# Patient Record
Sex: Female | Born: 1970 | Race: White | Hispanic: No | Smoking: Former smoker
Health system: Southern US, Community
[De-identification: ages and names within clinical notes are randomized; demographics above are authoritative.]

## PROBLEM LIST (undated history)

## (undated) DIAGNOSIS — F39 Unspecified mood [affective] disorder: Secondary | ICD-10-CM

## (undated) DIAGNOSIS — F419 Anxiety disorder, unspecified: Secondary | ICD-10-CM

## (undated) DIAGNOSIS — K649 Unspecified hemorrhoids: Secondary | ICD-10-CM

## (undated) DIAGNOSIS — E039 Hypothyroidism, unspecified: Secondary | ICD-10-CM

## (undated) DIAGNOSIS — R7303 Prediabetes: Secondary | ICD-10-CM

## (undated) DIAGNOSIS — Z720 Tobacco use: Secondary | ICD-10-CM

## (undated) DIAGNOSIS — T781XXA Other adverse food reactions, not elsewhere classified, initial encounter: Secondary | ICD-10-CM

## (undated) DIAGNOSIS — E785 Hyperlipidemia, unspecified: Secondary | ICD-10-CM

## (undated) DIAGNOSIS — Z8619 Personal history of other infectious and parasitic diseases: Secondary | ICD-10-CM

## (undated) DIAGNOSIS — K219 Gastro-esophageal reflux disease without esophagitis: Secondary | ICD-10-CM

## (undated) HISTORY — DX: Gastro-esophageal reflux disease without esophagitis: K21.9

## (undated) HISTORY — DX: Personal history of other infectious and parasitic diseases: Z86.19

## (undated) HISTORY — DX: Anxiety disorder, unspecified: F41.9

## (undated) HISTORY — PX: POLYPECTOMY: SHX149

## (undated) HISTORY — DX: Hyperlipidemia, unspecified: E78.5

## (undated) HISTORY — DX: Unspecified hemorrhoids: K64.9

## (undated) HISTORY — DX: Hypothyroidism, unspecified: E03.9

## (undated) HISTORY — PX: COLONOSCOPY: SHX174

## (undated) HISTORY — DX: Prediabetes: R73.03

## (undated) HISTORY — DX: Other adverse food reactions, not elsewhere classified, initial encounter: T78.1XXA

## (undated) HISTORY — DX: Tobacco use: Z72.0

## (undated) HISTORY — DX: Unspecified mood (affective) disorder: F39

---

## 1999-03-10 ENCOUNTER — Emergency Department (HOSPITAL_COMMUNITY): Admission: EM | Admit: 1999-03-10 | Discharge: 1999-03-11 | Payer: Self-pay | Admitting: Emergency Medicine

## 1999-03-11 ENCOUNTER — Encounter: Payer: Self-pay | Admitting: Emergency Medicine

## 1999-06-17 ENCOUNTER — Emergency Department (HOSPITAL_COMMUNITY): Admission: EM | Admit: 1999-06-17 | Discharge: 1999-06-17 | Payer: Self-pay

## 2003-05-28 ENCOUNTER — Emergency Department (HOSPITAL_COMMUNITY): Admission: AD | Admit: 2003-05-28 | Discharge: 2003-05-28 | Payer: Self-pay | Admitting: Family Medicine

## 2004-02-05 ENCOUNTER — Emergency Department (HOSPITAL_COMMUNITY): Admission: EM | Admit: 2004-02-05 | Discharge: 2004-02-06 | Payer: Self-pay | Admitting: Emergency Medicine

## 2005-10-13 ENCOUNTER — Emergency Department (HOSPITAL_COMMUNITY): Admission: EM | Admit: 2005-10-13 | Discharge: 2005-10-13 | Payer: Self-pay | Admitting: Family Medicine

## 2009-01-09 ENCOUNTER — Emergency Department (HOSPITAL_COMMUNITY): Admission: EM | Admit: 2009-01-09 | Discharge: 2009-01-09 | Payer: Self-pay | Admitting: Emergency Medicine

## 2009-01-09 ENCOUNTER — Emergency Department (HOSPITAL_COMMUNITY): Admission: EM | Admit: 2009-01-09 | Discharge: 2009-01-09 | Payer: Self-pay | Admitting: Family Medicine

## 2010-09-25 LAB — POCT URINALYSIS DIP (DEVICE)
Bilirubin Urine: NEGATIVE
Glucose, UA: NEGATIVE mg/dL
Ketones, ur: NEGATIVE mg/dL
Nitrite: NEGATIVE
Protein, ur: NEGATIVE mg/dL
Specific Gravity, Urine: 1.015 (ref 1.005–1.030)
Urobilinogen, UA: 0.2 mg/dL (ref 0.0–1.0)
pH: 6.5 (ref 5.0–8.0)

## 2010-09-25 LAB — COMPREHENSIVE METABOLIC PANEL
ALT: 15 U/L (ref 0–35)
AST: 20 U/L (ref 0–37)
Alkaline Phosphatase: 55 U/L (ref 39–117)
CO2: 23 mEq/L (ref 19–32)
Calcium: 9.4 mg/dL (ref 8.4–10.5)
Chloride: 105 mEq/L (ref 96–112)
GFR calc Af Amer: >60 mL/min (ref >60)
GFR calc non Af Amer: >60 mL/min (ref >60)
Glucose, Bld: 89 mg/dL (ref 70–99)
Potassium: 4.3 mEq/L (ref 3.5–5.1)
Sodium: 138 mEq/L (ref 135–145)

## 2010-09-25 LAB — LIPASE, BLOOD: Lipase: 26 U/L (ref 11–59)

## 2010-09-25 LAB — DIFFERENTIAL
Basophils Relative: 0 % (ref 0–1)
Eosinophils Absolute: 0.1 10*3/uL (ref 0.0–0.7)
Eosinophils Relative: 1 % (ref 0–5)
Lymphs Abs: 1.4 10*3/uL (ref 0.7–4.0)
Neutrophils Relative %: 76 % (ref 43–77)

## 2010-09-25 LAB — CBC
Hemoglobin: 15 g/dL (ref 12.0–15.0)
RBC: 4.6 MIL/uL (ref 3.87–5.11)
WBC: 8.4 10*3/uL (ref 4.0–10.5)

## 2010-09-25 LAB — POCT PREGNANCY, URINE: Preg Test, Ur: NEGATIVE

## 2012-03-08 ENCOUNTER — Encounter: Payer: Self-pay | Admitting: Pulmonary Disease

## 2012-03-11 ENCOUNTER — Ambulatory Visit (INDEPENDENT_AMBULATORY_CARE_PROVIDER_SITE_OTHER): Payer: No Typology Code available for payment source | Admitting: Pulmonary Disease

## 2012-03-11 ENCOUNTER — Encounter: Payer: Self-pay | Admitting: Pulmonary Disease

## 2012-03-11 VITALS — BP 122/76 | HR 73 | Temp 98.4°F | Ht 64.0 in | Wt 156.4 lb

## 2012-03-11 DIAGNOSIS — G4734 Idiopathic sleep related nonobstructive alveolar hypoventilation: Secondary | ICD-10-CM | POA: Insufficient documentation

## 2012-03-11 DIAGNOSIS — R0902 Hypoxemia: Secondary | ICD-10-CM

## 2012-03-11 DIAGNOSIS — J449 Chronic obstructive pulmonary disease, unspecified: Secondary | ICD-10-CM

## 2012-03-11 NOTE — Progress Notes (Signed)
  Subjective:    Patient ID: Alyssa Bartlett, female    DOB: 1970/12/06, 41 y.o.   MRN: [id number]  HPI PCP - Casimiro Needle Hilts 41 y.o ex smoker referred for evalution of nocturnal desaturation She underwent an allergy evaluation by Dr Lucie Leather for puffy, itchy eyes & nasal congestion. She reported excessive fatigue over last 3 y, some daytime somnolence. She had trouble initiating sleep ESS 4/24  Bedtime is 10-11 pm, latency 42m- 1 h, sleeps on her side x 2 pillows, 2-3 spont  Awakenings, no post void latency, oob by 6 30 am, feels refreshed, no headaches or dryness of mouth. She has gained 20 lbs in the last 2 y, denies excessive alcohol or caffeinated beverages. She smoked about 15 pyrs before quitting in 2010. 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 In hindsight, fatigue has improved after adjusting synthroid dose Home sleep study showed AHI 4/h or RDI 9/h but surprisingly ODI was 37/h & she spent 306 mins with satn < 89%. Spirometry only showed mild airway obstruction with FEV1 80%, FVC 100% & low ratio.  Past Medical History  Diagnosis Date  . Hypothyroidism     No past surgical history on file.  No Known Allergies  History   Social History  . Marital Status: Single    Spouse Name: N/A    Number of Children: N/A  . 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 Smoking status: Current Every Day Smoker -- 1.0 packs/day for 14 years    Types: Cigarettes    Start date: 07/20/2008  . Smokeless tobacco: Not on file  . Alcohol Use: Yes     wine  . Drug Use: No  . Sexually Active: Not on file   Other Topics Concern  . Not on file   Social History Narrative  . No narrative on file    Review of Systems  Constitutional: Negative for fever, appetite change and unexpected weight change.  HENT: Positive for congestion, sore throat, sneezing and postnasal drip. Negative for  ear pain, trouble swallowing and dental problem.   Respiratory: Positive for cough. Negative for shortness of breath.   Cardiovascular: Negative for chest pain, palpitations and leg swelling.  Gastrointestinal: Negative for nausea and abdominal pain.  Musculoskeletal: Negative for joint swelling.  Skin: Negative for rash.  Neurological: Negative for headaches.  Psychiatric/Behavioral: Negative for dysphoric mood. The patient is not nervous/anxious.        Objective:   Physical Exam  Gen. Pleasant, well-nourished, in no distress, normal affect ENT - no lesions, no post nasal drip Neck: No JVD, no thyromegaly, no carotid bruits Lungs: no use of accessory muscles, no dullness to percussion, clear without rales or rhonchi  Cardiovascular: Rhythm regular, heart sounds  normal, no murmurs or gallops, no peripheral edema Abdomen: soft and non-tender, no hepatosplenomegaly, BS normal. Musculoskeletal: No deformities, no cyanosis or clubbing Neuro:  alert, non focal        Assessment & Plan:

## 2012-03-11 NOTE — Patient Instructions (Addendum)
Drop in oxygen level appears out of proportion to lung function Repeat test

## 2012-03-11 NOTE — Assessment & Plan Note (Addendum)
Noct hypoxia seems out of proportion to airway obstruction or degree of sleep disordered breathing Will repeat nocturnal oximetry  On RA RDI of 9 & AHI 4 indicates mild upper airway resistance which does not need PAP therapy - encouraged 5-10 lb wt loss  The pathophysiology of obstructive sleep apnea , it's cardiovascular consequences & modes of treatment including CPAP were discused with the patient in detail & they evidenced understanding.

## 2012-03-19 ENCOUNTER — Encounter: Payer: Self-pay | Admitting: Pulmonary Disease

## 2012-04-04 ENCOUNTER — Telehealth: Payer: Self-pay | Admitting: Pulmonary Disease

## 2012-04-04 NOTE — Telephone Encounter (Signed)
ONO did not show significant desaturations Oxygen not required.

## 2012-04-05 NOTE — Telephone Encounter (Signed)
Returning call.

## 2012-04-05 NOTE — Telephone Encounter (Signed)
I spoke with patient about results and she verbalized understanding and had no questions 

## 2012-04-05 NOTE — Telephone Encounter (Signed)
[id number] x1 

## 2012-04-19 ENCOUNTER — Encounter: Payer: Self-pay | Admitting: Pulmonary Disease

## 2012-10-05 ENCOUNTER — Encounter (HOSPITAL_BASED_OUTPATIENT_CLINIC_OR_DEPARTMENT_OTHER): Payer: Self-pay | Admitting: *Deleted

## 2012-10-05 DIAGNOSIS — Z87891 Personal history of nicotine dependence: Secondary | ICD-10-CM | POA: Insufficient documentation

## 2012-10-05 DIAGNOSIS — R221 Localized swelling, mass and lump, neck: Secondary | ICD-10-CM | POA: Insufficient documentation

## 2012-10-05 DIAGNOSIS — R220 Localized swelling, mass and lump, head: Secondary | ICD-10-CM | POA: Insufficient documentation

## 2012-10-05 DIAGNOSIS — E039 Hypothyroidism, unspecified: Secondary | ICD-10-CM | POA: Insufficient documentation

## 2012-10-05 DIAGNOSIS — Z79899 Other long term (current) drug therapy: Secondary | ICD-10-CM | POA: Insufficient documentation

## 2012-10-05 NOTE — ED Notes (Addendum)
C/o right "facial swelling" that started this evening. Swelling to right side of lower face and lymph node/gland area  area under right jaw. Denies any fevers. Denies any cough. C/o tenderness to area. Denies sob.

## 2012-10-06 ENCOUNTER — Encounter (HOSPITAL_BASED_OUTPATIENT_CLINIC_OR_DEPARTMENT_OTHER): Payer: Self-pay | Admitting: Emergency Medicine

## 2012-10-06 ENCOUNTER — Emergency Department (HOSPITAL_BASED_OUTPATIENT_CLINIC_OR_DEPARTMENT_OTHER)
Admission: EM | Admit: 2012-10-06 | Discharge: 2012-10-06 | Disposition: A | Discharge status: Home / Self Care | Payer: No Typology Code available for payment source | Attending: Emergency Medicine | Admitting: Emergency Medicine

## 2012-10-06 DIAGNOSIS — R220 Localized swelling, mass and lump, head: Secondary | ICD-10-CM

## 2012-10-06 NOTE — ED Notes (Signed)
MD at bedside. 

## 2012-10-06 NOTE — ED Provider Notes (Signed)
History     CSN: [id number]  Arrival date & time 10/05/12  2327   First MD Initiated Contact with Patient 10/06/12 0041      Chief Complaint  Patient presents with  . Facial Swelling    (Consider location/radiation/quality/duration/timing/severity/associated sxs/prior treatment) HPI Comments: Pt with onset of right cheek and angle of jaw pain on right associated with sig swelling. No itching or rash.  Worse with trying to  Open jaw wide.  Able to eat, swallow, no dental pain, felts cheek become sore with trying to eat.  No fevers, chills, no recent new medications.  Took ibuprofen prior to arrival, swelling seems improved, still mildly tender and sore.  No prior history of same.  No CP, SOB, no other sig swollen lymph nodes.   The history is provided by the patient and a relative.    Past Medical History  Diagnosis Date  . Hypothyroidism     History reviewed. No pertinent past surgical history.  Family History  Problem Relation Age of Onset  . Allergic rhinitis Mother   . Allergic rhinitis Father   . Prostate cancer Father     History  Substance Use Topics  . Smoking status: Former Smoker -- 1.00 packs/day for 14 years    Types: Cigarettes    Start date: 07/20/2008  . Smokeless tobacco: Not on file  . Alcohol Use: Yes     Comment: wine    OB History   Grav Para Term Preterm Abortions TAB SAB Ect Mult Living                  Review of Systems  Constitutional: Negative for fever and chills.  HENT: Positive for facial swelling. Negative for ear pain, nosebleeds, congestion, sore throat, drooling, mouth sores, trouble swallowing, neck pain, dental problem, voice change and tinnitus.   Respiratory: Negative for cough and shortness of breath.   Gastrointestinal: Negative for nausea.  Skin: Negative for rash.  Neurological: Negative for headaches.    Allergies  Review of patient's allergies indicates no known allergies.  Home Medications   Current Outpatient  Rx  Name  Route  Sig  Dispense  Refill  . THYROID PO   Oral   Take 48.75 mg by mouth 2 (two) times daily.         . fish oil-omega-3 fatty acids 1000 MG capsule   Oral   Take 1 g by mouth daily.         . Multiple Vitamin (MULTIVITAMIN) tablet   Oral   Take 1 tablet by mouth daily.         Marland Kitchen thyroid (ARMOUR) 32.5 MG tablet   Oral   Take 48.75 mg by mouth 2 (two) times daily.            BP 128/77  Pulse 89  Temp(Src) 98.2 F (36.8 C) (Oral)  Resp 18  Ht 5\' 4"  (1.626 m)  Wt 150 lb (68.04 kg)  BMI 25.73 kg/m2  SpO2 96%  Physical Exam  Nursing note and vitals reviewed. Constitutional: She appears well-developed and well-nourished. No distress.  HENT:  Head: Normocephalic and atraumatic.  Mouth/Throat: No dental abscesses or dental caries. No oropharyngeal exudate.  No dental tenderness, fluctuance, no facial rash, tender to right TMJ  Eyes: EOM are normal.  Neck: Neck supple.  Pulmonary/Chest: Effort normal. No respiratory distress.  Abdominal: Soft.  Lymphadenopathy:    She has no cervical adenopathy.  Neurological: She is alert.    ED  Course  Procedures (including critical care time)  Labs Reviewed - No data to display No results found.   1. Right facial swelling     ra sat is 96% and I interpret to be normal  MDM  Pt with swelling and mild tenderness to angle of right jaw and cheek, posterior mandibular region.  Suspect TMJ versus salivary duct stone blockage.  Offered CT Scan.  Doubt abscess given no dental pain or tenderness, no fever.  Pt has no airway compromise.  Pt and family are ok with watchful waiting, taking ibuprofen and sour candies and can follow up with PCP as needed.  No new meds or rash to suggest allergy.        Gavin Pound. Oletta Lamas, MD 10/06/12 [phone]

## 2012-10-06 NOTE — Discharge Instructions (Signed)
 Watch for severe dental pain, fevers, or continued recurrence of swelling.  Rash, difficulty breathing or swallowing are also causes of concern that you should be re-evaluated for.

## 2014-09-10 ENCOUNTER — Emergency Department (HOSPITAL_COMMUNITY)
Admission: EM | Admit: 2014-09-10 | Discharge: 2014-09-10 | Disposition: A | Discharge status: Home / Self Care | Payer: No Typology Code available for payment source | Attending: Emergency Medicine | Admitting: Emergency Medicine

## 2014-09-10 ENCOUNTER — Encounter (HOSPITAL_COMMUNITY): Payer: Self-pay | Admitting: *Deleted

## 2014-09-10 DIAGNOSIS — Y998 Other external cause status: Secondary | ICD-10-CM | POA: Insufficient documentation

## 2014-09-10 DIAGNOSIS — Y9289 Other specified places as the place of occurrence of the external cause: Secondary | ICD-10-CM | POA: Insufficient documentation

## 2014-09-10 DIAGNOSIS — Z87891 Personal history of nicotine dependence: Secondary | ICD-10-CM | POA: Diagnosis not present

## 2014-09-10 DIAGNOSIS — F419 Anxiety disorder, unspecified: Secondary | ICD-10-CM | POA: Insufficient documentation

## 2014-09-10 DIAGNOSIS — T7840XA Allergy, unspecified, initial encounter: Secondary | ICD-10-CM

## 2014-09-10 DIAGNOSIS — Y9389 Activity, other specified: Secondary | ICD-10-CM | POA: Insufficient documentation

## 2014-09-10 DIAGNOSIS — X58XXXA Exposure to other specified factors, initial encounter: Secondary | ICD-10-CM | POA: Diagnosis not present

## 2014-09-10 DIAGNOSIS — T781XXA Other adverse food reactions, not elsewhere classified, initial encounter: Secondary | ICD-10-CM | POA: Insufficient documentation

## 2014-09-10 DIAGNOSIS — Z8639 Personal history of other endocrine, nutritional and metabolic disease: Secondary | ICD-10-CM | POA: Insufficient documentation

## 2014-09-10 DIAGNOSIS — R232 Flushing: Secondary | ICD-10-CM | POA: Diagnosis present

## 2014-09-10 LAB — I-STAT CHEM 8, ED
BUN: 9 mg/dL (ref 6–23)
CALCIUM ION: 1.1 mmol/L — AB (ref 1.12–1.23)
CREATININE: 0.6 mg/dL (ref 0.50–1.10)
Chloride: 104 mmol/L (ref 96–112)
GLUCOSE: 146 mg/dL — AB (ref 70–99)
HEMATOCRIT: 38 % (ref 36.0–46.0)
HEMOGLOBIN: 12.9 g/dL (ref 12.0–15.0)
Potassium: 3.1 mmol/L — ABNORMAL LOW (ref 3.5–5.1)
Sodium: 140 mmol/L (ref 135–145)
TCO2: 19 mmol/L (ref 0–100)

## 2014-09-10 LAB — CBC WITH DIFFERENTIAL/PLATELET
BASOS PCT: 0 % (ref 0–1)
Basophils Absolute: 0 10*3/uL (ref 0.0–0.1)
EOS ABS: 0 10*3/uL (ref 0.0–0.7)
EOS PCT: 0 % (ref 0–5)
HEMATOCRIT: 38.1 % (ref 36.0–46.0)
HEMOGLOBIN: 12.6 g/dL (ref 12.0–15.0)
LYMPHS ABS: 2 10*3/uL (ref 0.7–4.0)
Lymphocytes Relative: 19 % (ref 12–46)
MCH: 30.4 pg (ref 26.0–34.0)
MCHC: 33.1 g/dL (ref 30.0–36.0)
MCV: 91.8 fL (ref 78.0–100.0)
MONO ABS: 0.7 10*3/uL (ref 0.1–1.0)
MONOS PCT: 6 % (ref 3–12)
Neutro Abs: 8.2 10*3/uL — ABNORMAL HIGH (ref 1.7–7.7)
Neutrophils Relative %: 75 % (ref 43–77)
Platelets: 319 10*3/uL (ref 150–400)
RBC: 4.15 MIL/uL (ref 3.87–5.11)
RDW: 13.4 % (ref 11.5–15.5)
WBC: 10.9 10*3/uL — ABNORMAL HIGH (ref 4.0–10.5)

## 2014-09-10 MED ORDER — POTASSIUM CHLORIDE CRYS ER 20 MEQ PO TBCR
40.0000 meq | EXTENDED_RELEASE_TABLET | Freq: Once | ORAL | Status: AC
  Administered 2014-09-10: 40 meq via ORAL
  Filled 2014-09-10: qty 2

## 2014-09-10 MED ORDER — SODIUM CHLORIDE 0.9 % IV BOLUS (SEPSIS)
1000.0000 mL | Freq: Once | INTRAVENOUS | Status: AC
  Administered 2014-09-10: 1000 mL via INTRAVENOUS

## 2014-09-10 MED ORDER — LORAZEPAM 2 MG/ML IJ SOLN
0.5000 mg | Freq: Once | INTRAMUSCULAR | Status: AC
  Administered 2014-09-10: 0.5 mg via INTRAVENOUS
  Filled 2014-09-10: qty 1

## 2014-09-10 MED ORDER — EPINEPHRINE 0.3 MG/0.3ML IJ SOAJ: 0.3000 mg | Freq: Once | INTRAMUSCULAR | Status: DC

## 2014-09-10 MED ORDER — METHYLPREDNISOLONE SODIUM SUCC 125 MG IJ SOLR
125.0000 mg | Freq: Once | INTRAMUSCULAR | Status: AC
  Administered 2014-09-10: 125 mg via INTRAVENOUS
  Filled 2014-09-10: qty 2

## 2014-09-10 MED ORDER — SODIUM CHLORIDE 0.9 % IV BOLUS (SEPSIS): 1000.0000 mL | Freq: Once | INTRAVENOUS | Status: DC

## 2014-09-10 NOTE — ED Notes (Signed)
Per EMS- pt believes she had an allergic reaction to shrimp. Pt states that she felt burning to face neck chest and back. Pt denies any SOB. No airway involvment. Pt received 50mg  benadryl, 50mg  zantac and 0.3 of epi IM.

## 2014-09-10 NOTE — ED Provider Notes (Signed)
Care assumed from Delos Haring, PA-C at 3pm.  Patient eating lunch, became flushed with skin erythema diffusely.  Possibly 2/2 shrimp although patient has never had allergic reaction to shrimp in the past.  No airway involvement.  No hypotension, no vomiting, no diarrhea.  Patient got epi with EMS.  Plan is to observe until 6pm with re-assessment and discharge on scheduled benadryl.   Plan to follow up with pcp and allergist referral.  Patient re-evaluated multiple times and has had no recurrence of sx.  Patient feeling well, vitals unremarkable.  Will d/c on benadryl for the next few days.  Have given rx for epipen and will have her follow up with her pcp for allergist referral.  I have discussed the results, Dx and Tx plan with the patient. They expressed understanding and agree with the plan and were told to return to ED with any worsening of condition or concern.    Disposition: Discharge  Condition: Good  Discharge Medication List as of 09/10/2014  6:05 PM    START taking these medications   Details  EPINEPHrine 0.3 mg/0.3 mL IJ SOAJ injection Inject 0.3 mLs (0.3 mg total) into the muscle once., Starting 09/10/2014, Print        Follow Up: Provider Not In [place] [address] [phone] [place] [phone]  If symptoms worsen   Pt seen in conjunction with Dr. Debera Lat, MD 09/11/14 6004  Pattricia Boss, MD 09/12/14 [phone]

## 2014-09-10 NOTE — Discharge Instructions (Signed)

## 2014-09-10 NOTE — ED Provider Notes (Signed)
CSN: [id number]     Arrival date & time 09/10/14  1330 History   First MD Initiated Contact with Patient 09/10/14 1339     Chief Complaint  Patient presents with  . Allergic Reaction     (Consider location/radiation/quality/duration/timing/severity/associated sxs/prior Treatment) HPI   PCP: PROVIDER NOT IN SYSTEM Blood pressure 116/62, pulse 100, temperature 99.1 F (37.3 C), temperature source Oral, resp. rate 19, height 5\' 4"  (1.626 m), weight 165 lb (74.844 kg), SpO2 97 %.  Alyssa Bartlett is a 44 y.o.female with a significant PMH of hyopthyroidism  presents to the ER with complaints of allergic reaction. Patient visits to the ER by EMS. She had boiled shrimp for lunch with rice and quickly afterwards developed facial flushing that spread down her chest. She did not have any shortness of breath, nausea, vomiting, diarrhea, wheezing, facial, tongue or lip swelling. EMS was called and on arrival they gave her 50 mg IV Benadryl, 50 mg Zantac and 0.3 of epi IM. Shortly thereafter the patient's symptoms began to resolve. She no longer has flushed feeling or redness to her face chest. She denies having any respiratory complaints or swelling to her face. She has normal temperature, pulse, respirations, blood pressure and oxygen saturation. Pt in no distress. Admits to anxiety after the Epi. Denies knowledge of allergies in the past. Speaking in full sentences.    Past Medical History  Diagnosis Date  . Hypothyroidism    History reviewed. No pertinent past surgical history. Family History  Problem Relation Age of Onset  . Allergic rhinitis Mother   . Allergic rhinitis Father   . Prostate cancer Father    History  Substance Use Topics  . Smoking status: Former Smoker -- 1.00 packs/day for 14 years    Types: Cigarettes    Start date: 07/20/2008  . Smokeless tobacco: Not on file  . Alcohol Use: Yes     Comment: wine   OB History    No data available     Review of Systems  10  Systems reviewed and are negative for acute change except as noted in the HPI.    Allergies  Shrimp  Home Medications   Prior to Admission medications   Medication Sig Start Date End Date Taking? Authorizing Provider  B Complex CAPS Take 1 capsule by mouth daily.   Yes Historical Provider, MD  Cholecalciferol (VITAMIN D3) 5000 UNITS TABS Take 1 tablet by mouth daily.   Yes Historical Provider, MD  fish oil-omega-3 fatty acids 1000 MG capsule Take 1 g by mouth daily.   Yes Historical Provider, MD  ibuprofen (ADVIL,MOTRIN) 200 MG tablet Take 200 mg by mouth every 6 (six) hours as needed for fever or cramping.   Yes Historical Provider, MD  NATURE-THROID 48.75 MG TABS Take 48.75 mg by mouth 2 (two) times daily. 08/23/14  Yes Historical Provider, MD  Tetrahydroz-Glyc-Hyprom-PEG 0.05-0.2-0.36-1 % SOLN Apply 1-2 drops to eye daily as needed (red and burning eyes).   Yes Historical Provider, MD   BP 131/74 mmHg  Pulse 100  Temp(Src) 99.1 F (37.3 C) (Oral)  Resp 17  Ht 5\' 4"  (1.626 m)  Wt 165 lb (74.844 kg)  BMI 28.31 kg/m2  SpO2 95% Physical Exam  Constitutional: She appears well-developed and well-nourished. She does not have a sickly appearance. She does not appear ill. No distress.  HENT:  Head: Normocephalic and atraumatic.  Right Ear: Tympanic membrane and ear canal normal.  Left Ear: Tympanic membrane and ear canal normal.  Nose: Nose normal.  Mouth/Throat: Uvula is midline and mucous membranes are normal. No oropharyngeal exudate.  Eyes: Pupils are equal, round, and reactive to light.  Neck: Normal range of motion. Neck supple. No tracheal tenderness present. No tracheal deviation present.  Cardiovascular: Normal rate and regular rhythm.   Pulmonary/Chest: Effort normal and breath sounds normal. No stridor.  Abdominal: Soft. Bowel sounds are normal. There is no tenderness.  Neurological: She is alert.  Skin: Skin is warm and dry. No rash noted. Rash is not urticarial. She is  not diaphoretic.  Psychiatric: Her mood appears anxious.  Nursing note and vitals reviewed.   ED Course  Procedures (including critical care time) Labs Review Labs Reviewed  CBC WITH DIFFERENTIAL/PLATELET - Abnormal; Notable for the following:    WBC 10.9 (*)    Neutro Abs 8.2 (*)    All other components within normal limits  I-STAT CHEM 8, ED - Abnormal; Notable for the following:    Potassium 3.1 (*)    Glucose, Bld 146 (*)    Calcium, Ion 1.10 (*)    All other components within normal limits    Imaging Review No results found.   EKG Interpretation   Date/Time:  Thursday September 10 2014 13:31:35 EDT Ventricular Rate:  94 PR Interval:  137 QRS Duration: 95 QT Interval:  358 QTC Calculation: 448 R Axis:   76 Text Interpretation:  Sinus rhythm Borderline T abnormalities, anterior  leads Confirmed by Hazle Coca [phone]) on 09/10/2014 3:07:29 PM      MDM   Final diagnoses:  Allergic reaction, initial encounter    Medications  potassium chloride SA (K-DUR,KLOR-CON) CR tablet 40 mEq (not administered)  sodium chloride 0.9 % bolus 1,000 mL (1,000 mLs Intravenous New Bag/Given 09/10/14 1400)  LORazepam (ATIVAN) injection 0.5 mg (0.5 mg Intravenous Given 09/10/14 1400)  methylPREDNISolone sodium succinate (SOLU-MEDROL) 125 mg/2 mL injection 125 mg (125 mg Intravenous Given 09/10/14 1400)   Patient with allergic reaction, brought in by EMS. Given epi en route. Currently asymptomatic. Advised that she will need monitoring for the next 4 hours due to epi for cardiac monitoring.   At the end of shift patient hand off to MD Resident Jarome Matin.  Filed Vitals:   09/10/14 1501  BP: 131/74  Pulse: 100  Temp:   Resp: [address], PA-C 09/10/14 1517  Quintella Reichert, MD 09/10/14 1537

## 2014-09-14 ENCOUNTER — Telehealth: Payer: Self-pay | Admitting: *Deleted

## 2014-09-14 NOTE — Telephone Encounter (Signed)
Patient states she was seen in the ED last week and was told that she needed a f/u appointment with PCP.  Can I r/s patient to a sooner appointment?

## 2014-09-14 NOTE — Telephone Encounter (Signed)
Have never seen this patient. Please schedule new patient visit in available slot. Also, appear ED doc wanted her to see allergist - she does not need referral for this so she can call to schedule appt with allergist as well. Thanks.

## 2014-09-15 NOTE — Telephone Encounter (Signed)
Please see below.

## 2014-09-15 NOTE — Telephone Encounter (Signed)
Called pt and LM that she should keep her 10/07/14 appt with Dr Maudie Mercury as it was the first available. Also encouraged her to proceed with seeing an allergist and provided info for [facility]. Informed her that she does not need a referral to schedule with them.

## 2014-09-16 NOTE — Telephone Encounter (Signed)
Pt is aware to kept 4-20 appt and pt will call [facility] to make appt

## 2014-10-07 ENCOUNTER — Ambulatory Visit (INDEPENDENT_AMBULATORY_CARE_PROVIDER_SITE_OTHER): Payer: 59 | Admitting: Family Medicine

## 2014-10-07 ENCOUNTER — Encounter: Payer: Self-pay | Admitting: Family Medicine

## 2014-10-07 VITALS — BP 110/80 | HR 79 | Temp 98.1°F | Ht 63.75 in | Wt 156.4 lb

## 2014-10-07 DIAGNOSIS — IMO0001 Reserved for inherently not codable concepts without codable children: Secondary | ICD-10-CM

## 2014-10-07 DIAGNOSIS — Z7189 Other specified counseling: Secondary | ICD-10-CM | POA: Diagnosis not present

## 2014-10-07 DIAGNOSIS — E039 Hypothyroidism, unspecified: Secondary | ICD-10-CM | POA: Diagnosis not present

## 2014-10-07 DIAGNOSIS — T7840XA Allergy, unspecified, initial encounter: Secondary | ICD-10-CM

## 2014-10-07 DIAGNOSIS — F39 Unspecified mood [affective] disorder: Secondary | ICD-10-CM

## 2014-10-07 DIAGNOSIS — R030 Elevated blood-pressure reading, without diagnosis of hypertension: Secondary | ICD-10-CM | POA: Diagnosis not present

## 2014-10-07 DIAGNOSIS — Z7689 Persons encountering health services in other specified circumstances: Secondary | ICD-10-CM

## 2014-10-07 MED ORDER — EPINEPHRINE 0.3 MG/0.3ML IJ SOAJ: 0.3000 mg | Freq: Once | INTRAMUSCULAR | Status: DC

## 2014-10-07 NOTE — Progress Notes (Addendum)
HPI:  Alyssa Bartlett is here to establish care. Has not seen a primary care doctor in a long time. She sees [facility] for her thyroid disease. Last PCP and physical: used to see Dr. Julien Girt for her routine gyn physicals - had pap and mammo  Has the following chronic problems that require follow up and concerns today:  Allergic Reaction: -several weeks ago -developed redness and swelling of her face, arms, neck  -[facility] but [facility] >$400 -was treated in the ED and they thought she had anaphylactic reaction to seafood  Panic Disorder: -she has a history of GAD and panic disorder but had not had symptoms in a long time -symptoms retriggered by her recent allergic reaction -she reports this has made her blood pressure go up -she saw urgent care for this a few days ago  Elevated Blood Pressure: -with a panic attack last week -has been monitoring this at home and it has been fine  Hypothyroidism: -denies hx of thyroid nodules or cancer -sees [facility] for her thyroid managemnt as she did not feel well on regular thyroid treatment -meds nature-hroid -chronically fatigued  Hx of smoking: -quit 5 years ago -still uses nicotine gum from time to time  ADDENDUM: received and reviewed 09/17/14 [facility] labs/studies: normal complete cbc with diff, normal cmp, normal complete thyroid panel, normal CXR, mildly elevated LDL (119) on complete lipid panel   ROS negative for unless reported above: fevers, unintentional weight loss, hearing or vision loss, chest pain, palpitations, struggling to breath, hemoptysis, melena, hematochezia, hematuria, falls, loc, si, thoughts of self harm  Past Medical History  Diagnosis Date  . Hypothyroidism     managed by [facility]  . Mood disorder     GAD, panic, depression  . History of chicken pox   . Allergic reaction to food     ? shellfish  . Prediabetes   . Mild  hyperlipidemia   . Tobacco use     No past surgical history on file.  Family History  Problem Relation Age of Onset  . Allergic rhinitis Mother   . Allergic rhinitis Father   . Prostate cancer Father   . Hyperlipidemia Mother   . Hyperlipidemia Father     History   Social History  . Marital Status: Single    Spouse Name: N/A  . Number of Children: N/A  . 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 Smoking status: Former Smoker -- 1.00 packs/day for 14 years    Types: Cigarettes    Start date: 07/20/2008  . Smokeless tobacco: Not on file  . Alcohol Use: Yes     Comment: wine  . Drug Use: No  . Sexual Activity: Not on file   Other Topics Concern  . 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  ibuprofen (ADVIL,MOTRIN) 200 MG tablet, Take 200 mg by mouth every 6 (six) hours as needed for fever or cramping., Disp: , Rfl:  .  NATURE-THROID 48.75 MG TABS, Take 48.75  mg by mouth 2 (two) times daily., Disp: , Rfl:  .  nicotine polacrilex (NICORETTE) 4 MG gum, Take 4 mg by mouth as needed for smoking cessation., Disp: , Rfl:  .  EPINEPHrine 0.3 mg/0.3 mL IJ SOAJ injection, Inject 0.3 mLs (0.3 mg total) into the muscle once., Disp: 1 Device, Rfl: 0  EXAM:  Filed Vitals:   10/07/14 1112  BP: 110/80  Pulse: 79  Temp: 98.1 F (36.7 C)    Body mass index is 27.07 kg/(m^2).  GENERAL: vitals reviewed and listed above, alert, oriented, appears well hydrated and in no acute distress  HEENT: atraumatic, conjunttiva clear, no obvious abnormalities on inspection of external nose and ears  NECK: no obvious masses on inspection  LUNGS:  clear to auscultation bilaterally, no wheezes, rales or rhonchi, good air movement  CV: HRRR, no peripheral edema  MS: moves all extremities without noticeable abnormality  PSYCH: pleasant and cooperative, no obvious depression or anxiety  ASSESSMENT AND PLAN:  Discussed the following assessment and plan:  Allergic reaction, initial encounter -new rx for epipen -advised she see allergist and she has number to call for appt  Hypothyroidism, unspecified hypothyroidism type -managed by [facility] -discussed issues with naturothroid - she prefers to continue for now as feels better on this and will continue management with [facility]  GAD, panic disorder -counseled and supported -she is to see Dr. Glennon Hamilton and number provided -she prefers to avoid medications if possible  Elevated blood pressure -normal today, normal on home checks, reports elevation during panic attack at urgent care - plan to obtain urgent care notes -follow at physical  Encounter to establish care -We reviewed the PMH, PSH, FH, SH, Meds and Allergies. -We provided refills for any medications we will prescribe as needed. -We addressed current concerns per orders and patient instructions. -We have asked for records for pertinent exams, studies, vaccines and notes from previous providers. -We have advised patient to follow up per instructions below.   ADDENDUM: received and reviewed 09/17/14 [facility] labs/studies: normal complete cbc with diff, normal cmp, normal complete thyroid panel, normal CXR, mildly elevated LDL (119) on complete lipid panel  -Patient advised to return or notify a doctor immediately if symptoms worsen or persist or new concerns arise.  Patient Instructions  BEFORE YOU LEAVE: -schedule follow 3 months for preventive care visit an basic labs - come fasting  Call to schedule appointment with Irven Shelling about the anxiety  Call to schedule appointment with the allergist  FOR YOUR  ANXIETY, STRESS or DEPRESSION:  []  Seek counseling (with Dr. Glennon Hamilton) - this is as effective as medications and will help to get at the root of the imbalance. Please use the number provided to set up an appointment.  []  Ensure AT LEAST 150 minutes of cardiovascular exercise per week - 30 minutes of sweaty exercise daily is best.  []  Set a schedule that includes adequate time for sleep, fun activities and exercise.  []  Medications are best used short term while finding a healthier more balanced life that promotes good emotional and mental health. I do not prescribe sleep medications such as Ambien, etc. or controlled anxiety medications such as Xanax, Klonopin, etc. long term in adult patients and will have you see a psychiatrist if these types of medications are required on more then a temporary basis.           Colin Benton R.

## 2014-10-07 NOTE — Patient Instructions (Addendum)
BEFORE YOU LEAVE: -schedule follow 3 months for preventive care visit an basic labs - come fasting  Call to schedule appointment with Irven Shelling about the anxiety  Call to schedule appointment with the allergist  FOR YOUR ANXIETY, STRESS or DEPRESSION:  []  Seek counseling (with Dr. Glennon Hamilton) - this is as effective as medications and will help to get at the root of the imbalance. Please use the number provided to set up an appointment.  []  Ensure AT LEAST 150 minutes of cardiovascular exercise per week - 30 minutes of sweaty exercise daily is best.  []  Set a schedule that includes adequate time for sleep, fun activities and exercise.  []  Medications are best used short term while finding a healthier more balanced life that promotes good emotional and mental health. I do not prescribe sleep medications such as Ambien, etc. or controlled anxiety medications such as Xanax, Klonopin, etc. long term in adult patients and will have you see a psychiatrist if these types of medications are required on more then a temporary basis.

## 2014-10-07 NOTE — Progress Notes (Signed)
Pre visit review using our clinic review tool, if applicable. No additional management support is needed unless otherwise documented below in the visit note. 

## 2014-10-16 ENCOUNTER — Ambulatory Visit (INDEPENDENT_AMBULATORY_CARE_PROVIDER_SITE_OTHER): Payer: 59 | Admitting: Psychology

## 2014-10-16 DIAGNOSIS — F410 Panic disorder [episodic paroxysmal anxiety] without agoraphobia: Secondary | ICD-10-CM | POA: Diagnosis not present

## 2014-10-23 ENCOUNTER — Encounter: Payer: Self-pay | Admitting: Family Medicine

## 2014-10-30 ENCOUNTER — Ambulatory Visit (INDEPENDENT_AMBULATORY_CARE_PROVIDER_SITE_OTHER): Payer: 59 | Admitting: Psychology

## 2014-10-30 DIAGNOSIS — F410 Panic disorder [episodic paroxysmal anxiety] without agoraphobia: Secondary | ICD-10-CM

## 2014-11-06 ENCOUNTER — Ambulatory Visit (INDEPENDENT_AMBULATORY_CARE_PROVIDER_SITE_OTHER): Payer: 59 | Admitting: Psychology

## 2014-11-06 DIAGNOSIS — F410 Panic disorder [episodic paroxysmal anxiety] without agoraphobia: Secondary | ICD-10-CM | POA: Diagnosis not present

## 2014-11-13 ENCOUNTER — Encounter: Payer: Self-pay | Admitting: Family Medicine

## 2014-11-13 ENCOUNTER — Ambulatory Visit (INDEPENDENT_AMBULATORY_CARE_PROVIDER_SITE_OTHER): Payer: 59 | Admitting: Psychology

## 2014-11-13 ENCOUNTER — Ambulatory Visit (INDEPENDENT_AMBULATORY_CARE_PROVIDER_SITE_OTHER): Payer: 59 | Admitting: Family Medicine

## 2014-11-13 VITALS — BP 110/80 | Temp 98.5°F | Wt 155.0 lb

## 2014-11-13 DIAGNOSIS — N951 Menopausal and female climacteric states: Secondary | ICD-10-CM

## 2014-11-13 DIAGNOSIS — R232 Flushing: Secondary | ICD-10-CM | POA: Diagnosis not present

## 2014-11-13 DIAGNOSIS — E039 Hypothyroidism, unspecified: Secondary | ICD-10-CM

## 2014-11-13 DIAGNOSIS — F410 Panic disorder [episodic paroxysmal anxiety] without agoraphobia: Secondary | ICD-10-CM | POA: Diagnosis not present

## 2014-11-13 NOTE — Patient Instructions (Signed)
Wean off of the nicotine gum: -put the pieces in a box or bag that you care with you -wean down by two pieces every 3-5 days  We placed a referral for you as discussed to the endocrinologist. It usually takes about 1-2 weeks to process and schedule this referral. If you have not heard from [place] regarding this appointment in 2 weeks please contact our office.

## 2014-11-13 NOTE — Progress Notes (Signed)
HPI:  Acute visit for:  Hot flashes: -started several months ago -of note has hx sig anxiety, panic disorder, hypothyroidism - on nature-throid prescribed by [facility] whom manages her thyroid disease -normal thyroid panel, CBC and CMP in last 2 months reviewed from outside records -she has notices periods have changed some over the last 1 year, a bit heavier, sometimes skips a month -she is very worried about hormonal issues - reports her integrative doctor and her gynecologist have told her she is perimenopausal, but she is worried about other endocrine problems or endocrine tumors after reading on line -she reports she saw an endocrinologist in the past, can not remember the name, but feels she was not listened to and that medications were pushed on her -denies: fevers, malaise, weight loss, pain anywhere, changes in bowels, palpitations -she is chewing nicotine gum all day long  ROS: See pertinent positives and negatives per HPI.  Past Medical History  Diagnosis Date  . Hypothyroidism     managed by [facility]  . Mood disorder     GAD, panic, depression  . History of chicken pox   . Allergic reaction to food     ? shellfish  . Prediabetes   . Mild hyperlipidemia   . Tobacco use     No past surgical history on file.  Family History  Problem Relation Age of Onset  . Allergic rhinitis Mother   . Allergic rhinitis Father   . Prostate cancer Father   . Hyperlipidemia Mother   . Hyperlipidemia Father     History   Social History  . Marital Status: Single    Spouse Name: N/A  . Number of Children: N/A  . 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 Smoking status: Former Smoker -- 1.00 packs/day for 14 years    Types: Cigarettes    Start date: 07/20/2008  . Smokeless tobacco: Not on file  . Alcohol Use: Yes     Comment: wine  . Drug Use: No  . Sexual Activity:  Not on file   Other Topics Concern  . 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  EPINEPHrine 0.3 mg/0.3 mL IJ SOAJ injection, Inject 0.3 mLs (0.3 mg total) into the muscle once., Disp: 1 Device, Rfl: 0 .  ibuprofen (ADVIL,MOTRIN) 200 MG tablet, Take 200 mg by mouth every 6 (six) hours as needed for fever or cramping., Disp: , Rfl:  .  NATURE-THROID 48.75 MG TABS, Take 48.75 mg by mouth 2 (two) times daily., Disp: , Rfl:  .  nicotine polacrilex (NICORETTE) 4 MG gum, Take 4 mg by mouth as needed for smoking cessation., Disp: , Rfl:   EXAM:  Filed Vitals:   11/13/14 1415  BP: 110/80  Temp: 98.5 F (36.9 C)    Body mass index is 26.82 kg/(m^2).  GENERAL: vitals reviewed and listed above, alert, oriented, appears well hydrated and in no acute distress  HEENT: atraumatic, conjunttiva clear, no obvious abnormalities on inspection of external nose and ears  NECK: no obvious masses  on inspection  LUNGS: clear to auscultation bilaterally, no wheezes, rales or rhonchi, good air movement  CV: HRRR, no peripheral edema  MS: moves all extremities without noticeable abnormality  PSYCH: pleasant and cooperative, no obvious depression or anxiety  ASSESSMENT AND PLAN:  Discussed the following assessment and plan:  Hot flashes - Plan: Ambulatory referral to Endocrinology  Hypothyroidism, unspecified hypothyroidism type - Plan: Ambulatory referral to Endocrinology  Flushing - Plan: Ambulatory referral to Endocrinology  -25 minutes spent with this pt with more then 50% of the time spent face to face in counseling -outside labs reviewed -her symptoms are likely from early perimenopause and anxiety possible  nicotine, but she would like further reassurance and after discussion she opted to see an endocrinologist -advised to wean off of the nicotine as well - offered Wellbutrin if needs this, she wants to try to come off on her own -referral placed -follow up as needed and in 2-3 months -Patient advised to return or notify a doctor immediately if symptoms worsen or persist or new concerns arise.  Patient Instructions  Wean off of the nicotine gum: -put the pieces in a box or bag that you care with you -wean down by two pieces every 3-5 days  We placed a referral for you as discussed to the endocrinologist. It usually takes about 1-2 weeks to process and schedule this referral. If you have not heard from [place] regarding this appointment in 2 weeks please contact our office.      Colin Benton R.

## 2014-11-25 ENCOUNTER — Ambulatory Visit (INDEPENDENT_AMBULATORY_CARE_PROVIDER_SITE_OTHER): Payer: 59 | Admitting: Psychology

## 2014-11-25 DIAGNOSIS — F410 Panic disorder [episodic paroxysmal anxiety] without agoraphobia: Secondary | ICD-10-CM

## 2014-11-26 ENCOUNTER — Telehealth: Payer: Self-pay | Admitting: Family Medicine

## 2014-11-26 NOTE — Telephone Encounter (Signed)
Alyssa Bartlett,   Can you please schedule her an appointment to discuss medications. I received a message from Dr. Glennon Hamilton that she is interested in medications for anxiety. Thank you.

## 2014-11-26 NOTE — Telephone Encounter (Signed)
Left a detailed message at the pts cell number  for the pt to call back to schedule an appt with Dr Maudie Mercury.

## 2014-11-27 ENCOUNTER — Ambulatory Visit (INDEPENDENT_AMBULATORY_CARE_PROVIDER_SITE_OTHER): Payer: 59 | Admitting: Psychology

## 2014-11-27 DIAGNOSIS — F410 Panic disorder [episodic paroxysmal anxiety] without agoraphobia: Secondary | ICD-10-CM

## 2014-11-30 ENCOUNTER — Ambulatory Visit (INDEPENDENT_AMBULATORY_CARE_PROVIDER_SITE_OTHER): Payer: 59 | Admitting: Family Medicine

## 2014-11-30 ENCOUNTER — Encounter: Payer: Self-pay | Admitting: Family Medicine

## 2014-11-30 VITALS — BP 110/78 | HR 85 | Temp 98.1°F | Ht 63.75 in | Wt 150.2 lb

## 2014-11-30 DIAGNOSIS — B360 Pityriasis versicolor: Secondary | ICD-10-CM | POA: Diagnosis not present

## 2014-11-30 DIAGNOSIS — F411 Generalized anxiety disorder: Secondary | ICD-10-CM

## 2014-11-30 MED ORDER — VENLAFAXINE HCL ER 37.5 MG PO CP24: 37.5000 mg | ORAL_CAPSULE | Freq: Every day | ORAL | Status: DC

## 2014-11-30 MED ORDER — KETOCONAZOLE 2 % EX CREA: 1.0000 "application " | TOPICAL_CREAM | Freq: Every day | CUTANEOUS | Status: DC

## 2014-11-30 NOTE — Progress Notes (Signed)
HPI:  Anxiety: -her issues started in her 61s, on wellbutrin -seeing Dr. Glennon Hamilton for hx GAD, panic disorder and depression -interested in starting medication now, maybe - feeling better the last few days, wants rx, but may hold off for a few days -symptoms: worry all the time, panic attacks sometime, improving with CBT, mild depression -also has hot flashes, seeing endo about this -denies: SI, thoughts of self harm, hx of manic symptoms  Tenia versicolor: -on back -worse when hot -wants topical tx for this - tried OTC treatments, not helpfull  ROS: See pertinent positives and negatives per HPI.  Past Medical History  Diagnosis Date  . Hypothyroidism     managed by [facility]  . Mood disorder     GAD, panic, depression  . History of chicken pox   . Allergic reaction to food     ? shellfish  . Prediabetes   . Mild hyperlipidemia   . Tobacco use     No past surgical history on file.  Family History  Problem Relation Age of Onset  . Allergic rhinitis Mother   . Allergic rhinitis Father   . Prostate cancer Father   . Hyperlipidemia Mother   . Hyperlipidemia Father     History   Social History  . Marital Status: Single    Spouse Name: N/A  . Number of Children: N/A  . 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 Smoking status: Former Smoker -- 1.00 packs/day for 14 years    Types: Cigarettes    Start date: 07/20/2008  . Smokeless tobacco: Not on file  . Alcohol Use: Yes     Comment: wine  . Drug Use: No  . Sexual Activity: Not on file   Other Topics Concern  . 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  EPINEPHrine 0.3 mg/0.3 mL IJ SOAJ injection, Inject 0.3 mLs (0.3 mg total) into the muscle once., Disp: 1 Device, Rfl: 0 .  ibuprofen (ADVIL,MOTRIN) 200 MG tablet, Take 200 mg by mouth every 6 (six) hours as needed for fever or cramping., Disp: , Rfl:  .  NATURE-THROID 48.75 MG TABS, Take 48.75 mg by mouth 2 (two) times daily., Disp: , Rfl:  .  nicotine polacrilex (NICORETTE) 4 MG gum, Take 4 mg by mouth as needed for smoking cessation., Disp: , Rfl:  .  ketoconazole (NIZORAL) 2 % cream, Apply 1 application topically daily. Apply twice daily to affected area for 15-21 daus, Disp: 30 g, Rfl: 1 .  venlafaxine XR (EFFEXOR-XR) 37.5 MG 24 hr capsule, Take 1 capsule (37.5 mg total) by mouth daily with breakfast., Disp: 30 capsule, Rfl: 2  EXAM:  Filed Vitals:   11/30/14 1520  BP: 110/78  Pulse: 85  Temp: 98.1 F (36.7 C)    Body mass index is 25.99 kg/(m^2).  GENERAL: vitals reviewed and listed above, alert, oriented, appears well hydrated and in no acute distress  HEENT: atraumatic, conjunttiva clear, no obvious abnormalities on inspection of external nose and ears  NECK: no obvious masses on  inspection  LUNGS: clear to auscultation bilaterally, no wheezes, rales or rhonchi, good air movement  CV: HRRR, no peripheral edema  SKIN: scattered erythematous oval macules with fine peripheral scale on the back only  MS: moves all extremities without noticeable abnormality  PSYCH: pleasant and cooperative, no obvious depression or anxiety  ASSESSMENT AND PLAN:  Discussed the following assessment and plan:  GAD (generalized anxiety disorder) - Plan: venlafaxine XR (EFFEXOR-XR) 37.5 MG 24 hr capsule -discussed options and risks and she opted to go with effexor -she may hold off on starting -follow up 1 month after starting -cont care with Dr. Glennon Hamilton  Tinea versicolor - Plan: ketoconazole (NIZORAL) 2 % cream -advised to follow up if persists  -Patient advised to return or notify a  doctor immediately if symptoms worsen or persist or new concerns arise.  Patient Instructions  Start the effexor once daily and follow up one month after starting  Try the antifungal cream twice daily for 2-3 weeks       Alyssa Bartlett, Alyssa R.

## 2014-11-30 NOTE — Progress Notes (Signed)
Pre visit review using our clinic review tool, if applicable. No additional management support is needed unless otherwise documented below in the visit note. 

## 2014-11-30 NOTE — Patient Instructions (Signed)
Start the effexor once daily and follow up one month after starting  Try the antifungal cream twice daily for 2-3 weeks

## 2014-12-07 ENCOUNTER — Ambulatory Visit (INDEPENDENT_AMBULATORY_CARE_PROVIDER_SITE_OTHER): Payer: 59 | Admitting: Psychology

## 2014-12-07 DIAGNOSIS — F410 Panic disorder [episodic paroxysmal anxiety] without agoraphobia: Secondary | ICD-10-CM | POA: Diagnosis not present

## 2014-12-18 ENCOUNTER — Ambulatory Visit: Payer: 59 | Admitting: Psychology

## 2014-12-25 ENCOUNTER — Ambulatory Visit (INDEPENDENT_AMBULATORY_CARE_PROVIDER_SITE_OTHER): Payer: 59 | Admitting: Psychology

## 2014-12-25 DIAGNOSIS — F410 Panic disorder [episodic paroxysmal anxiety] without agoraphobia: Secondary | ICD-10-CM | POA: Diagnosis not present

## 2015-01-01 ENCOUNTER — Ambulatory Visit
Admission: RE | Admit: 2015-01-01 | Discharge: 2015-01-01 | Disposition: A | Discharge status: Home / Self Care | Payer: 59 | Source: Ambulatory Visit | Attending: Internal Medicine | Admitting: Internal Medicine

## 2015-01-01 ENCOUNTER — Ambulatory Visit: Payer: 59 | Admitting: Psychology

## 2015-01-01 ENCOUNTER — Ambulatory Visit (INDEPENDENT_AMBULATORY_CARE_PROVIDER_SITE_OTHER): Payer: 59 | Admitting: Internal Medicine

## 2015-01-01 ENCOUNTER — Encounter: Payer: Self-pay | Admitting: Internal Medicine

## 2015-01-01 VITALS — BP 112/68 | HR 80 | Temp 98.4°F | Resp 12 | Ht 64.0 in | Wt 148.0 lb

## 2015-01-01 DIAGNOSIS — E039 Hypothyroidism, unspecified: Secondary | ICD-10-CM

## 2015-01-01 DIAGNOSIS — N951 Menopausal and female climacteric states: Secondary | ICD-10-CM

## 2015-01-01 DIAGNOSIS — E0789 Other specified disorders of thyroid: Secondary | ICD-10-CM | POA: Diagnosis not present

## 2015-01-01 LAB — TSH: TSH: 1.03 u[IU]/mL (ref 0.35–4.50)

## 2015-01-01 LAB — FOLLICLE STIMULATING HORMONE: FSH: 10.5 m[IU]/mL

## 2015-01-01 LAB — T4, FREE: FREE T4: 0.9 ng/dL (ref 0.60–1.60)

## 2015-01-01 LAB — LUTEINIZING HORMONE: LH: 7.35 m[IU]/mL

## 2015-01-01 LAB — T3, FREE: T3 FREE: 5.2 pg/mL — AB (ref 2.3–4.2)

## 2015-01-01 NOTE — Patient Instructions (Addendum)
Please stop at the lab.  Please schedule a thyroid U/S in [facility].  For now, continue current dose of Naturethroid 48.7 mg 2x a day.  Take the thyroid hormone every day, with water, >30 minutes before breakfast, separated by >4 hours from acid reflux medications, calcium, iron, multivitamins.  Please return in 4 months.

## 2015-01-01 NOTE — Progress Notes (Signed)
Patient ID: Alyssa Bartlett, female   DOB: [birth year], 44 y.o.   MRN: [id number]   HPI  Alyssa Bartlett is a 44 y.o.-year-old female, referred by her PCP, Dr. Maudie Mercury, for management of hypothyroidism and in consultation for hot flushes/anxiety.  Pt. has been dx with hypothyroidism in 2011; started Synthroid, then Levothyroxine >> did not feel good (aches, pains, fatigue) she was seeing [facility] providers and also saw endocrinology before; is on NatureThroid 48.7 mg bid (~160 mcg LT4 daily) >> feels better, weight normalized >> however, recently, increased anxiety in last 4 mo - after an allergic rxn (or Niacin flush - was taking a B complex at that time), worse fatigue, insomnia.  I reviewed Dr Julianne Rice OV notes.  She takes the NT: - fasting in am and in pm (3 pm) - with water - separated by 1h from b'fast and 2h after lunch - no calcium, iron, PPIs, multivitamins   I reviewed pt's thyroid tests - per scanned records in Epic: 09/17/2014: 0.779, TT4 6.3 (4.5-12)  Pt describes: - + hot flushes (and flushing - upper part of the body) - feels the erythema stays all day;  followed by cold sweats; also can be cold  - + anxiety and panic attacks >> was on Wellbutrin and was started on Effexor, but too expensive and she is afraid of SEs. She is seeing Irven Shelling (CBT). - + decreased appetite and weight loss - + palpitations - + tremors - + fatigue - + foggy brain - + irritability - + constipation - + dry skin - no hair loss - + menses = heavy, can skip a month  Pt was told by her integrative medicine doctor and ObGyn that she is perimenopausal. I do not have an [place] available for review.  Pt denies feeling nodules in neck, + hoarseness, no dysphagia/odynophagia, SOB with lying down.  She has no FH of thyroid disorders.No FH of thyroid cancer.  No h/o radiation tx to head or neck. No recent use of iodine supplements.  I reviewed her chart and she also has a history of  prediabetes, mild HL.  ROS: Constitutional: see HPI Eyes: + blurry vision, no xerophthalmia ENT: + sore throat, no nodules palpated in throat, no dysphagia/odynophagia, + hoarseness Cardiovascular: no CP/no SOB/+ palpitations/no leg swelling Respiratory: no cough/SOB Gastrointestinal: + N/no V/D/+ C Musculoskeletal: no muscle/joint aches Skin: no rashes Neurological: + tremors/no numbness/tingling/dizziness, + HA Psychiatric: no depression/+ anxiety + low libido  Past Medical History  Diagnosis Date  . Hypothyroidism     managed by [facility]  . Mood disorder     GAD, panic, depression  . History of chicken pox   . Allergic reaction to food     ? shellfish  . Prediabetes   . Mild hyperlipidemia   . Tobacco use    No past surgical history on file. History   Social History  . Marital Status: Single    Spouse Name: N/A  . Number of Children: 0   Occupational History  . intake speacialists    Social History Main Topics  . Smoking status: Former Smoker -- 1.00 packs/day for 14 years    Types: Cigarettes    Start date: 07/20/2008  . Smokeless tobacco: Not on file  . Alcohol Use: Yes     Comment: wine 2-3 drinks 4-5x a week  . Drug Use: No   Social History Narrative   Work or School: intake Sales promotion account executive at [employer]  Home Situation:lives with boyfriend      Spiritual Beliefs: none, but feels spiritually connected to nature and feels better outside      Lifestyle: yoga a few times per week; diet is pretty good         Current Outpatient Prescriptions on File Prior to Visit  Medication Sig Dispense Refill  . EPINEPHrine 0.3 mg/0.3 mL IJ SOAJ injection Inject 0.3 mLs (0.3 mg total) into the muscle once. 1 Device 0  . ibuprofen (ADVIL,MOTRIN) 200 MG tablet Take 200 mg by mouth every 6 (six) hours as needed for fever or cramping.    Marland Kitchen ketoconazole (NIZORAL) 2 % cream Apply 1 application topically daily. Apply twice daily to affected area for 15-21  daus 30 g 1  . NATURE-THROID 48.75 MG TABS Take 48.75 mg by mouth 2 (two) times daily.    . nicotine polacrilex (NICORETTE) 4 MG gum Take 4 mg by mouth as needed for smoking cessation.    Marland Kitchen venlafaxine XR (EFFEXOR-XR) 37.5 MG 24 hr capsule Take 1 capsule (37.5 mg total) by mouth daily with breakfast. 30 capsule 2   No current facility-administered medications on file prior to visit.   Allergies  Allergen Reactions  . Shrimp [Shellfish Allergy]    Family History  Problem Relation Age of Onset  . Allergic rhinitis Mother   . Allergic rhinitis Father   . Prostate cancer Father   . Hyperlipidemia Mother   . Hyperlipidemia Father   HTN in M and F  PE: BP 112/68 mmHg  Pulse 80  Temp(Src) 98.4 F (36.9 C) (Oral)  Resp 12  Ht 5\' 4"  (1.626 m)  Wt 148 lb (67.132 kg)  BMI 25.39 kg/m2  SpO2 97% Wt Readings from Last 3 Encounters:  01/01/15 148 lb (67.132 kg)  11/30/14 150 lb 3.2 oz (68.13 kg)  11/13/14 155 lb (70.308 kg)   Constitutional: normal weight, in NAD Eyes: PERRLA, EOMI, no exophthalmos ENT: moist mucous membranes, no thyromegaly, but L tyroid fullness, no cervical lymphadenopathy Cardiovascular: RRR, No MRG Respiratory: CTA B Gastrointestinal: abdomen soft, NT, ND, BS+ Musculoskeletal: no deformities, strength intact in all 4 Skin: moist, warm, no rashes Neurological: + mild tremor with outstretched hands, DTR normal in all 4  ASSESSMENT: 1. Hypothyroidism  2. Hot flushes/anxiety  PLAN:  1. Patient with long-standing hypothyroidism, on NatureThroid therapy. She appears thyrotoxic: appears anxious, has mild tremors, lost weight and has anxiety and pbs sleeping. She does not appear to have a goiter, thyroid nodules, or neck compression symptoms, but has L sided thyroid fullness >> she agrees to have a thyroid U/S >> ordered - We discussed about correct intake of levothyroxine, fasting, with water, separated by at least 30 minutes from breakfast, and separated by more  than 4 hours from calcium, iron, multivitamins, acid reflux medications (PPIs). She is taking it correctly - will check thyroid tests today: TSH, free T4, free T3 - when results are back, if they are abnormal >> will need to change the NT dose. If they are normal, we may need to temporarily switch to Synthroid to avoid the hypermetabolic effect of LT3 - If these are abnormal, she will need to return in 6-8 weeks for repeat labs - If these are normal, I will see her back in 4 months  2. Hot flushes - see above  - will also check for perimenopause: FSH, LH, E2  3. Anxiety - see above - per PCP - can maybe try Ativan prn as she does  not want to start long-standing meds  CLINICAL DATA: 44 year old female. Left-sided thyroid lesions on physical exam.  EXAM: THYROID ULTRASOUND  TECHNIQUE: Ultrasound examination of the thyroid gland and adjacent soft tissues was performed.  COMPARISON: None.  FINDINGS: Right thyroid lobe  Measurements: 3.3 cm x 0.8 cm x 0.9 cm. Small hypoechoic nodule at the inferior right thyroid measures approximately 3 mm. Heterogeneous appearance of the thyroid tissue  Left thyroid lobe  Measurements: 3.1 cm x 0.6 cm x 0.8 cm. No nodules. Heterogeneous appearance of the thyroid tissue.  Isthmus  Thickness: 3 mm. No nodules visualized.  Lymphadenopathy  None visualized.  IMPRESSION: Relatively unremarkable appearance of the thyroid with single small benign-appearing nodule on the right. No left-sided nodules.  Findings do not meet current SRU consensus criteria for biopsy. Follow-up by clinical exam is recommended. If patient has known risk factors for thyroid carcinoma, consider follow-up ultrasound in 12 months. If patient is clinically hyperthyroid, consider nuclear medicine thyroid uptake and scan.Reference: Management of Thyroid Nodules Detected at [place]: Society of Radiologists in [place]. Radiology 2005;  [id number].  Signed,  Dulcy Fanny. Earleen Newport, DO  [facility]  [facility]   Electronically Signed By: Corrie Mckusick D.O. On: 01/01/2015 11:53  Office Visit on 01/01/2015  Component Date Value Ref Range Status  . TSH 01/01/2015 1.03  0.35 - 4.50 uIU/mL Final  . Free T4 01/01/2015 0.90  0.60 - 1.60 ng/dL Final  . T3, Free 01/01/2015 5.2* 2.3 - 4.2 pg/mL Final  . [facility] 01/01/2015 10.5   Final   Female Reference Range:  1.4-18.1 mIU/mLFemale Reference Range:Follicular Phase          2.5-10.2 mIU/mLMidCycle Peak          3.4-33.4 mIU/mLLuteal Phase          1.5-9.1 mIU/mLPost Menopausal     23.0-116.3 mIU/mLPregnant          <0.3 mIU/mL  . [place] 01/01/2015 7.35   Final   Comment: Female Reference Range:20-70 yrs     1.5-9.3 mIU/mL>70 yrs       3.1-35.6 mIU/mLFemale Reference Range:Follicular Phase     5.3-66.4 mIU/mLMidcycle             8.7-76.3 mIU/mLLuteal Phase         0.5-16.9 mIU/mL  Post Menopausal      15.9-54.0  mIU/mLPregnant             <1.5 mIU/mLContraceptives       0.7-5.6 mIU/mL    Estradiol level pending. No clear signs of menopause. [place] a little higher than normal, but she is approaching midcycle. TFTs normal >> I would suggest trying Synthroid 150 mcg daily for few months so that she does not get exogenous T3 to see if this improves her anxiety/flushing.  Pt agrees with switching to Synthroid. Will recheck TFTs in 6 weeks. She will d/w PCP to try Ativan prn for anxiety.

## 2015-01-04 DIAGNOSIS — N951 Menopausal and female climacteric states: Secondary | ICD-10-CM | POA: Insufficient documentation

## 2015-01-04 DIAGNOSIS — E039 Hypothyroidism, unspecified: Secondary | ICD-10-CM | POA: Insufficient documentation

## 2015-01-04 DIAGNOSIS — E0789 Other specified disorders of thyroid: Secondary | ICD-10-CM | POA: Insufficient documentation

## 2015-01-04 MED ORDER — SYNTHROID 150 MCG PO TABS: 150.0000 ug | ORAL_TABLET | Freq: Every day | ORAL | Status: DC

## 2015-01-05 ENCOUNTER — Telehealth: Payer: Self-pay | Admitting: Family Medicine

## 2015-01-05 ENCOUNTER — Telehealth: Payer: Self-pay | Admitting: *Deleted

## 2015-01-05 NOTE — Telephone Encounter (Signed)
Called pt and advised her per Dr Arman Filter message below. Pt voiced understanding and will give it some more time.

## 2015-01-05 NOTE — Telephone Encounter (Signed)
Patient started taking her new thyroid medication this morning and she feels off, a little weak and tired, she feels fuzzy headed and like her ears are stopped up.

## 2015-01-05 NOTE — Telephone Encounter (Signed)
Please read message below. Pt wants to know if these are normal side effects. Please advise.

## 2015-01-05 NOTE — Telephone Encounter (Signed)
Advise follow up appt - we prescribed anxiety medication last visit and may ned to adjust or change medication.

## 2015-01-05 NOTE — Telephone Encounter (Signed)
Pt call to ask if Dr Maudie Mercury received any information on from Dr Cruzita Lederer concerning her thyroid. She is calling to ask for a rx for anxiety that is being cause by her T3

## 2015-01-05 NOTE — Telephone Encounter (Signed)
It is not unusual as her body is used to getting a lot of T3.Marland KitchenMarland KitchenLet's give the Synthroid more time >> if these sxs do not go away, we may need to add back T3 sooner.

## 2015-01-06 LAB — ESTRADIOL, FREE
ESTRADIOL: 33 pg/mL
Estradiol, Free: 0.57 pg/mL

## 2015-01-06 NOTE — Telephone Encounter (Signed)
Patient informed of the message below and states she will call back for an appt.

## 2015-01-06 NOTE — Telephone Encounter (Signed)
Please let her know. Advise appt so that we can best help her as there are lots of options for treatment and risks with each. We need to discuss prior to changing or adding medications. Xanax and Ativan would not be my first choice, especially if does not want long term treatment, as these can be very difficult to discontinue. I do not usually prescribe these medications for regular use due to many potential side effects and difficulty stopping once started.Thank you.

## 2015-01-06 NOTE — Telephone Encounter (Signed)
Pt said she never picked up the venlafaxine XR (EFFEXOR-XR) 37.5 MG 24 hr capsule She said she had decided not to take it  because of the side effects. She said she does not  want a long term med. Would like something like XANAX. She said Dr Cruzita Lederer note suggested xanax or [place]

## 2015-01-07 ENCOUNTER — Ambulatory Visit (INDEPENDENT_AMBULATORY_CARE_PROVIDER_SITE_OTHER): Payer: 59 | Admitting: Family Medicine

## 2015-01-07 ENCOUNTER — Encounter: Payer: Self-pay | Admitting: Family Medicine

## 2015-01-07 VITALS — BP 118/82 | HR 96 | Temp 98.1°F | Ht 64.0 in | Wt 145.7 lb

## 2015-01-07 DIAGNOSIS — H6983 Other specified disorders of Eustachian tube, bilateral: Secondary | ICD-10-CM | POA: Diagnosis not present

## 2015-01-07 DIAGNOSIS — F411 Generalized anxiety disorder: Secondary | ICD-10-CM | POA: Diagnosis not present

## 2015-01-07 DIAGNOSIS — F329 Major depressive disorder, single episode, unspecified: Secondary | ICD-10-CM

## 2015-01-07 DIAGNOSIS — F418 Other specified anxiety disorders: Secondary | ICD-10-CM

## 2015-01-07 DIAGNOSIS — F419 Anxiety disorder, unspecified: Principal | ICD-10-CM

## 2015-01-07 DIAGNOSIS — F32A Depression, unspecified: Secondary | ICD-10-CM

## 2015-01-07 DIAGNOSIS — H6993 Unspecified Eustachian tube disorder, bilateral: Secondary | ICD-10-CM

## 2015-01-07 MED ORDER — LORAZEPAM 0.5 MG PO TABS: 0.5000 mg | ORAL_TABLET | Freq: Two times a day (BID) | ORAL | Status: DC

## 2015-01-07 MED ORDER — VENLAFAXINE HCL ER 37.5 MG PO CP24: 37.5000 mg | ORAL_CAPSULE | Freq: Every day | ORAL | Status: DC

## 2015-01-07 NOTE — Patient Instructions (Addendum)
BEFORE YOU LEAVE: -schedule follow up in 1 month  Start the Effexor once daily - call if you would prefer to do prozac or paxil  Use the ativan as little as possible  Continue counseling  AFRIN 2 times daily for 3 days then STOP

## 2015-01-07 NOTE — Progress Notes (Signed)
Pre visit review using our clinic review tool, if applicable. No additional management support is needed unless otherwise documented below in the visit note. 

## 2015-01-07 NOTE — Progress Notes (Signed)
HPI:  Anxiety: -her issues started in her 43s, on wellbutrin in the past -opted for trial of effexor last visit but she did not start due to fear of side effects and is expensive with her insurance -seeing Dr. Glennon Hamilton for hx GAD, panic disorder and depression -symptoms: worries all the time, tearful, emotional, fatigue, panic attacks sometime, improving with CBT, mild depression, panic attacks on occasion -also has hot flashes, seeing endo about this -denies: SI, thoughts of self harm, hx of manic symptoms Symptoms of GAD: Restlessness, on edge: yes  Easily Fatigued: yes Difficulty concentrating: yes Irritability: yes Muscle tension yes Sleep Disturbance: yes Depression Symptoms: Sleep disorder: yes Interest deficit/anhedonia: yes Guilt (worthlessness, hopelessness, regret): yes Energy deficit: yes Concentration deficit: yes Appetite disorder: yes  Psychomotor retardation or agitation: sluggish Suicidality: none  Hypothyroidism: -sees Dr. Cruzita Lederer and [facility]  Ear Pressure: -started 2 days ago -does have some sinus congestion, sneezing, PND -denies: pain in ears, fevers, sinus pain  ROS: See pertinent positives and negatives per HPI.  Past Medical History  Diagnosis Date  . Hypothyroidism     managed by [facility]  . Mood disorder     GAD, panic, depression  . History of chicken pox   . Allergic reaction to food     ? shellfish  . Prediabetes   . Mild hyperlipidemia   . Tobacco use     No past surgical history on file.  Family History  Problem Relation Age of Onset  . Allergic rhinitis Mother   . Allergic rhinitis Father   . Prostate cancer Father   . Hyperlipidemia Mother   . Hyperlipidemia Father     History   Social History  . Marital Status: Single    Spouse Name: N/A  . Number of Children: N/A  . 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  Smoking status: Former Smoker -- 1.00 packs/day for 14 years    Types: Cigarettes    Start date: 07/20/2008  . Smokeless tobacco: Not on file  . Alcohol Use: Yes     Comment: wine  . Drug Use: No  . Sexual Activity: Not on file   Other Topics Concern  . 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  nicotine polacrilex (NICORETTE) 4 MG gum, Take 4 mg by mouth as needed for smoking cessation., Disp: , Rfl:  .  SYNTHROID 150 MCG tablet, Take 1 tablet (150 mcg total) by mouth daily before breakfast., Disp: 45 tablet, Rfl: 1 .  LORazepam (ATIVAN) 0.5 MG tablet, Take 1 tablet (0.5 mg total) by mouth 2 (two) times daily. Only as needed for severe anxiety. Use a little as possible., Disp: 15 tablet, Rfl: 0 .  venlafaxine XR (EFFEXOR-XR) 37.5 MG 24 hr capsule, Take 1 capsule (37.5 mg total) by mouth daily with breakfast., Disp: 30 capsule, Rfl: 2  EXAM:  Filed Vitals:   01/07/15 1604  BP: 118/82  Pulse: 96  Temp: 98.1 F (36.7 C)    Body mass index is 25 kg/(m^2).  GENERAL:  vitals reviewed and listed above, alert, oriented, appears well hydrated and in no acute distress  HEENT: atraumatic, conjunttiva clear, no obvious abnormalities on inspection of external nose and ears, normal appearance of ear canals and TMs with clear effusion bilat, clear nasal congestion, mild post oropharyngeal erythema with PND, no tonsillar edema or exudate, no sinus TTP  NECK: no obvious masses on inspection  LUNGS: clear to auscultation bilaterally, no wheezes, rales or rhonchi, good air movement  CV: HRRR, no peripheral edema  MS: moves all extremities without noticeable abnormality  PSYCH: anxious, tearful  ASSESSMENT AND  PLAN:  Discussed the following assessment and plan:  >25 minutes spent with this patient with >50% of the time spent face to face in counseling:  Anxiety and depression - Plan: venlafaxine XR (EFFEXOR-XR) 37.5 MG 24 hr capsule, LORazepam (ATIVAN) 0.5 MG tablet -discussed treatment options for anxiety and depression -cont CBT -she opted to see if she can find effexor for cheaper or try an SSRI - she is going to email after checking with her pharmacy and I suggested several other pharmacies and the [facility] and [facility] cheap list -she also has opted to try ativan prn on rare occassions for panic or severe anxiety, aware of risks and understands I do not prescribe for regular use -follow up in 1 month or sooner if needed  GAD (generalized anxiety disorder) - Plan: venlafaxine XR (EFFEXOR-XR) 37.5 MG 24 hr capsule -see above  Eustachian tube dysfunction, bilateral -trial afrin for 3 days, then stop -also discussed may need INS for 1 month -Patient advised to return or notify a doctor immediately if symptoms worsen or persist or new concerns arise.  Patient Instructions  BEFORE YOU LEAVE: -schedule follow up in 1 month  Start the Effexor once daily - call if you would prefer to do prozac or paxil  Use the ativan as little as possible  Continue counseling  AFRIN 2 times daily for 3 days then [place], [place]

## 2015-01-11 ENCOUNTER — Encounter: Payer: Self-pay | Admitting: Family Medicine

## 2015-01-15 ENCOUNTER — Ambulatory Visit (INDEPENDENT_AMBULATORY_CARE_PROVIDER_SITE_OTHER): Payer: 59 | Admitting: Psychology

## 2015-01-15 DIAGNOSIS — F410 Panic disorder [episodic paroxysmal anxiety] without agoraphobia: Secondary | ICD-10-CM | POA: Diagnosis not present

## 2015-01-26 ENCOUNTER — Encounter: Payer: 59 | Admitting: Family Medicine

## 2015-01-29 ENCOUNTER — Ambulatory Visit (INDEPENDENT_AMBULATORY_CARE_PROVIDER_SITE_OTHER): Payer: 59 | Admitting: Psychology

## 2015-01-29 DIAGNOSIS — F410 Panic disorder [episodic paroxysmal anxiety] without agoraphobia: Secondary | ICD-10-CM

## 2015-02-05 ENCOUNTER — Ambulatory Visit (INDEPENDENT_AMBULATORY_CARE_PROVIDER_SITE_OTHER): Payer: 59 | Admitting: Psychology

## 2015-02-05 DIAGNOSIS — F410 Panic disorder [episodic paroxysmal anxiety] without agoraphobia: Secondary | ICD-10-CM | POA: Diagnosis not present

## 2015-02-17 ENCOUNTER — Other Ambulatory Visit: Payer: 59

## 2015-02-18 ENCOUNTER — Other Ambulatory Visit (INDEPENDENT_AMBULATORY_CARE_PROVIDER_SITE_OTHER): Payer: 59

## 2015-02-18 DIAGNOSIS — E039 Hypothyroidism, unspecified: Secondary | ICD-10-CM

## 2015-02-18 LAB — T4, FREE: Free T4: 2.06 ng/dL — ABNORMAL HIGH (ref 0.60–1.60)

## 2015-02-18 LAB — T3, FREE: T3 FREE: 4.5 pg/mL — AB (ref 2.3–4.2)

## 2015-02-18 LAB — TSH: TSH: 0.12 u[IU]/mL — ABNORMAL LOW (ref 0.35–4.50)

## 2015-02-19 ENCOUNTER — Encounter: Payer: Self-pay | Admitting: Internal Medicine

## 2015-02-19 ENCOUNTER — Other Ambulatory Visit: Payer: Self-pay | Admitting: *Deleted

## 2015-02-19 ENCOUNTER — Ambulatory Visit (INDEPENDENT_AMBULATORY_CARE_PROVIDER_SITE_OTHER): Payer: 59 | Admitting: Psychology

## 2015-02-19 DIAGNOSIS — F410 Panic disorder [episodic paroxysmal anxiety] without agoraphobia: Secondary | ICD-10-CM | POA: Diagnosis not present

## 2015-02-19 MED ORDER — SYNTHROID 125 MCG PO TABS: 125.0000 ug | ORAL_TABLET | Freq: Every day | ORAL | Status: DC

## 2015-02-26 ENCOUNTER — Other Ambulatory Visit: Payer: Self-pay | Admitting: Family Medicine

## 2015-03-05 ENCOUNTER — Ambulatory Visit: Payer: 59 | Admitting: Psychology

## 2015-03-12 ENCOUNTER — Encounter: Payer: Self-pay | Admitting: Family Medicine

## 2015-03-12 ENCOUNTER — Ambulatory Visit (INDEPENDENT_AMBULATORY_CARE_PROVIDER_SITE_OTHER): Payer: 59 | Admitting: Family Medicine

## 2015-03-12 VITALS — BP 100/68 | HR 78 | Temp 98.1°F | Ht 64.25 in | Wt 141.3 lb

## 2015-03-12 DIAGNOSIS — F410 Panic disorder [episodic paroxysmal anxiety] without agoraphobia: Secondary | ICD-10-CM | POA: Diagnosis not present

## 2015-03-12 DIAGNOSIS — Z0000 Encounter for general adult medical examination without abnormal findings: Secondary | ICD-10-CM

## 2015-03-12 DIAGNOSIS — R5382 Chronic fatigue, unspecified: Secondary | ICD-10-CM

## 2015-03-12 DIAGNOSIS — F3341 Major depressive disorder, recurrent, in partial remission: Secondary | ICD-10-CM | POA: Diagnosis not present

## 2015-03-12 DIAGNOSIS — F411 Generalized anxiety disorder: Secondary | ICD-10-CM | POA: Diagnosis not present

## 2015-03-12 DIAGNOSIS — E079 Disorder of thyroid, unspecified: Secondary | ICD-10-CM

## 2015-03-12 LAB — LIPID PANEL
CHOLESTEROL: 163 mg/dL (ref 0–200)
HDL: 58.3 mg/dL (ref >39.00)
LDL CALC: 89 mg/dL (ref 0–99)
NonHDL: 105.07
Total CHOL/HDL Ratio: 3
Triglycerides: 79 mg/dL (ref 0.0–149.0)
VLDL: 15.8 mg/dL (ref 0.0–40.0)

## 2015-03-12 LAB — CBC WITH DIFFERENTIAL/PLATELET
BASOS ABS: 0 10*3/uL (ref 0.0–0.1)
Basophils Relative: 0.5 % (ref 0.0–3.0)
Eosinophils Absolute: 0.1 10*3/uL (ref 0.0–0.7)
Eosinophils Relative: 1.5 % (ref 0.0–5.0)
HEMATOCRIT: 41.2 % (ref 36.0–46.0)
HEMOGLOBIN: 13.8 g/dL (ref 12.0–15.0)
LYMPHS PCT: 29.2 % (ref 12.0–46.0)
Lymphs Abs: 1.6 10*3/uL (ref 0.7–4.0)
MCHC: 33.4 g/dL (ref 30.0–36.0)
MCV: 90.8 fl (ref 78.0–100.0)
MONOS PCT: 6.6 % (ref 3.0–12.0)
Monocytes Absolute: 0.4 10*3/uL (ref 0.1–1.0)
NEUTROS ABS: 3.4 10*3/uL (ref 1.4–7.7)
Neutrophils Relative %: 62.2 % (ref 43.0–77.0)
Platelets: 264 10*3/uL (ref 150.0–400.0)
RBC: 4.54 Mil/uL (ref 3.87–5.11)
RDW: 12.5 % (ref 11.5–15.5)
WBC: 5.5 10*3/uL (ref 4.0–10.5)

## 2015-03-12 LAB — BASIC METABOLIC PANEL
BUN: 10 mg/dL (ref 6–23)
CALCIUM: 9.6 mg/dL (ref 8.4–10.5)
CO2: 28 meq/L (ref 19–32)
Chloride: 104 mEq/L (ref 96–112)
Creatinine, Ser: 0.55 mg/dL (ref 0.40–1.20)
GFR: 127.38 mL/min (ref >60.00)
Glucose, Bld: 98 mg/dL (ref 70–99)
Potassium: 4.9 mEq/L (ref 3.5–5.1)
SODIUM: 140 meq/L (ref 135–145)

## 2015-03-12 LAB — HEMOGLOBIN A1C: HEMOGLOBIN A1C: 5.5 % (ref 4.6–6.5)

## 2015-03-12 MED ORDER — LORAZEPAM 0.5 MG PO TABS
0.5000 mg | ORAL_TABLET | Freq: Two times a day (BID) | ORAL | Status: DC
Start: 2015-03-12 — End: 2015-05-07

## 2015-03-12 NOTE — Patient Instructions (Signed)
BEFORE YOU LEAVE: -labs -schedule follow up in 4-6 months  Vit D3 [phone] IU daily  B12 sublingual 1056mg every other day  Align or culturelle probiotic daily for 3 months  We recommend the following healthy lifestyle measures: - eat a healthy diet consisting of lots of vegetables, fruits, beans, nuts, seeds, healthy meats such as white chicken and fish and whole grains.  - avoid sweets, white starches, fried foods, fast food, processed foods, sodas, red meet and other fattening foods.  - get a least 150-300 minutes of aerobic exercise per week. Consider a yoga, tai chi or meditation class weekly also to help with balancing.

## 2015-03-12 NOTE — Progress Notes (Signed)
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 Cruzita Lederer -reports change from naturethroid to synthroid has been rocky -reports has recheck in a few weeks as recently changed dose of synthroid  -Diet: variety of foods, balance and well rounded  -Exercise: no regular exercise  -Taking folic acid, vitamin D or calcium: no  -Diabetes and Dyslipidemia Screening: FASTING and wants to check today, reports hx prediabetes  -Hx of HTN: no  -Vaccines: refused flu vaccine  -pap history: does with gyn  -wants STI testing (Hep C if born [birth year]): no  -FH breast, colon or ovarian ca: see FH Last mammogram: does with gyn Breast Ca Risk Assessment: does with gyn  -Alcohol, Tobacco, drug use: see social history  Review of Systems - no fevers, unintentional weight loss, vision loss, hearing loss, chest pain, sob, hemoptysis, melena, hematochezia, hematuria, genital discharge, changing or concerning skin lesions, bleeding, bruising, loc, thoughts of self harm or SI  Past Medical History  Diagnosis Date  . Hypothyroidism     managed by [facility]  . Mood disorder     GAD, panic, depression  . History of chicken pox   . Allergic reaction to food     ? shellfish  . Prediabetes   . Mild hyperlipidemia   . Tobacco use     No past  surgical history on file.  Family History  Problem Relation Age of Onset  . Allergic rhinitis Mother   . Allergic rhinitis Father   . Prostate cancer Father   . Hyperlipidemia Mother   . Hyperlipidemia Father     Social History   Social History  . Marital Status: Single    Spouse Name: N/A  . Number of Children: N/A  . 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 Smoking status: Former Smoker -- 1.00 packs/day for 14 years    Types: Cigarettes    Start date: 07/20/2008  . Smokeless tobacco: None  . Alcohol Use: Yes     Comment: wine  . Drug Use: No  . Sexual Activity: Not Asked   Other Topics Concern  . 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  LORazepam (ATIVAN) 0.5 MG tablet, Take 1 tablet (0.5 mg total) by mouth 2 (two) times daily. Only as needed for severe anxiety. Use a little as possible., Disp: 15 tablet, Rfl: 0 .  nicotine polacrilex (NICORETTE) 4 MG gum, Take 4 mg by mouth as needed for smoking cessation., Disp: ,  Rfl:  .  SYNTHROID 125 MCG tablet, Take 1 tablet (125 mcg total) by mouth daily before breakfast., Disp: 45 tablet, Rfl: 1 .  SYNTHROID 150 MCG tablet, Take 1 tablet (150 mcg total) by mouth daily before breakfast., Disp: 45 tablet, Rfl: 1 .  venlafaxine XR (EFFEXOR-XR) 37.5 MG 24 hr capsule, Take 1 capsule (37.5 mg total) by mouth daily with breakfast., Disp: 30 capsule, Rfl: 2  EXAM:  Filed Vitals:   03/12/15 0821  BP: 100/68  Pulse: 78  Temp: 98.1 F (36.7 C)    GENERAL: vitals reviewed and listed below, alert, oriented, appears well hydrated and in no acute distress  HEENT: head atraumatic, PERRLA,  normal appearance of eyes, ears, nose and mouth. moist mucus membranes.  NECK: supple, no masses or lymphadenopathy  LUNGS: clear to auscultation bilaterally, no rales, rhonchi or wheeze  CV: HRRR, no peripheral edema or cyanosis, normal pedal pulses  BREAST: declined, does with gyn  ABDOMEN: bowel sounds normal, soft, non tender to palpation, no masses, no rebound or guarding  GU: declined, does with gyn  RECTAL: refused  SKIN: no rash or abnormal lesions  MS: normal gait, moves all extremities normally  NEURO: CN II-XII grossly intact, normal muscle strength and sensation to light touch on extremities  PSYCH: normal affect, pleasant and cooperative  ASSESSMENT AND PLAN:  Discussed the following assessment and plan:  Visit for preventive health examination  Major depressive disorder, recurrent episode, in partial remission  GAD (generalized anxiety disorder)  Panic disorder - Plan: LORazepam (ATIVAN) 0.5 MG tablet  Thyroid disease  Chronic fatigue - Plan: Lipid Panel, Hemoglobin Y7X, Basic metabolic panel, CBC with Differential   -Discussed and advised all [place] preventive services health task force level A and B recommendations for age, sex and risks.  -Advised at least 150 minutes of exercise per week and a healthy diet low in saturated fats and sweets and consisting of fresh fruits and vegetables, lean meats such as fish and white chicken and whole grains.  -FASTING labs, studies and vaccines per orders this encounter  -she is working with Dr. Glennon Hamilton on the depression and anxiety, this along with her thyroid dz and stress and unhappiness with her job are likely contributing to her poor mod an dlow anergy. Check basic labs. Advise healthy lifestyle and regular exercise. She is interested in supplements for improved energy - discuss risks/limitations/quality issues. She plans to try Vit D3, B12 and probiotic and advised of some brands that [facility] has independently  tested and found to be potentially safer. She is consdiering a Licensed conveyancer and this may be helpful as much of her discontent is centered around her job.  Orders Placed This Encounter  Procedures  . Lipid Panel  . Hemoglobin A1c  . Basic metabolic panel  . CBC with Differential    Patient advised to return to clinic immediately if symptoms worsen or persist or new concerns.  Patient Instructions  BEFORE YOU LEAVE: -labs -schedule follow up in 4-6 months  Vit D3 [phone] IU daily  B12 sublingual 1087mg every other day  Align or culturelle probiotic daily for 3 months  We recommend the following healthy lifestyle measures: - eat a healthy diet consisting of lots of vegetables, fruits, beans, nuts, seeds, healthy meats such as white chicken and fish and whole grains.  - avoid sweets, white starches, fried foods, fast food, processed foods, sodas, red meet and other fattening foods.  - get a least 150-300 minutes of aerobic  exercise per week. Consider a yoga, tai chi or meditation class weekly also to help with balancing.     No Follow-up on file.  Colin Benton R.

## 2015-03-12 NOTE — Progress Notes (Signed)
Pre visit review using our clinic review tool, if applicable. No additional management support is needed unless otherwise documented below in the visit note. 

## 2015-03-19 ENCOUNTER — Ambulatory Visit (INDEPENDENT_AMBULATORY_CARE_PROVIDER_SITE_OTHER): Payer: 59 | Admitting: Psychology

## 2015-03-19 DIAGNOSIS — F410 Panic disorder [episodic paroxysmal anxiety] without agoraphobia: Secondary | ICD-10-CM

## 2015-03-25 LAB — HM DIABETES EYE EXAM

## 2015-03-26 ENCOUNTER — Ambulatory Visit
Admission: RE | Admit: 2015-03-26 | Discharge: 2015-03-26 | Disposition: A | Discharge status: Home / Self Care | Payer: 59 | Source: Ambulatory Visit | Attending: Ophthalmology | Admitting: Ophthalmology

## 2015-03-26 ENCOUNTER — Other Ambulatory Visit (HOSPITAL_COMMUNITY): Payer: Self-pay | Admitting: Ophthalmology

## 2015-03-26 ENCOUNTER — Inpatient Hospital Stay (HOSPITAL_COMMUNITY)
Admission: EM | Admit: 2015-03-26 | Discharge: 2015-03-29 | DRG: 615 | Disposition: A | Discharge status: Home / Self Care | Payer: 59 | Attending: Neurosurgery | Admitting: Neurosurgery

## 2015-03-26 ENCOUNTER — Emergency Department (HOSPITAL_COMMUNITY): Payer: 59 | Admitting: Anesthesiology

## 2015-03-26 ENCOUNTER — Ambulatory Visit (HOSPITAL_COMMUNITY): Payer: 59

## 2015-03-26 ENCOUNTER — Inpatient Hospital Stay: Admission: RE | Admit: 2015-03-26 | Payer: 59 | Source: Other Acute Inpatient Hospital | Admitting: Neurosurgery

## 2015-03-26 ENCOUNTER — Encounter (HOSPITAL_COMMUNITY)
Admission: EM | Disposition: A | Discharge status: Home / Self Care | Payer: Self-pay | Source: Home / Self Care | Attending: Neurosurgery

## 2015-03-26 ENCOUNTER — Encounter (HOSPITAL_COMMUNITY): Payer: Self-pay | Admitting: *Deleted

## 2015-03-26 ENCOUNTER — Other Ambulatory Visit: Payer: Self-pay | Admitting: Ophthalmology

## 2015-03-26 DIAGNOSIS — F329 Major depressive disorder, single episode, unspecified: Secondary | ICD-10-CM | POA: Diagnosis present

## 2015-03-26 DIAGNOSIS — H538 Other visual disturbances: Secondary | ICD-10-CM | POA: Diagnosis not present

## 2015-03-26 DIAGNOSIS — D352 Benign neoplasm of pituitary gland: Secondary | ICD-10-CM | POA: Diagnosis present

## 2015-03-26 DIAGNOSIS — H53413 Scotoma involving central area, bilateral: Secondary | ICD-10-CM

## 2015-03-26 DIAGNOSIS — E039 Hypothyroidism, unspecified: Secondary | ICD-10-CM | POA: Diagnosis present

## 2015-03-26 DIAGNOSIS — E785 Hyperlipidemia, unspecified: Secondary | ICD-10-CM | POA: Diagnosis present

## 2015-03-26 DIAGNOSIS — H53421 Scotoma of blind spot area, right eye: Secondary | ICD-10-CM

## 2015-03-26 DIAGNOSIS — Z79899 Other long term (current) drug therapy: Secondary | ICD-10-CM | POA: Diagnosis not present

## 2015-03-26 DIAGNOSIS — Z87891 Personal history of nicotine dependence: Secondary | ICD-10-CM

## 2015-03-26 DIAGNOSIS — E236 Other disorders of pituitary gland: Secondary | ICD-10-CM | POA: Diagnosis present

## 2015-03-26 HISTORY — PX: SINUS ENDO W/FUSION: SHX777

## 2015-03-26 HISTORY — PX: CRANIOTOMY: SHX93

## 2015-03-26 LAB — CBC WITH DIFFERENTIAL/PLATELET
Basophils Absolute: 0 10*3/uL (ref 0.0–0.1)
Basophils Relative: 0 %
Eosinophils Absolute: 0.1 10*3/uL (ref 0.0–0.7)
Eosinophils Relative: 1 %
HCT: 44.6 % (ref 36.0–46.0)
Hemoglobin: 15 g/dL (ref 12.0–15.0)
Lymphocytes Relative: 23 %
Lymphs Abs: 1.5 10*3/uL (ref 0.7–4.0)
MCH: 30.1 pg (ref 26.0–34.0)
MCHC: 33.6 g/dL (ref 30.0–36.0)
MCV: 89.6 fL (ref 78.0–100.0)
Monocytes Absolute: 0.4 10*3/uL (ref 0.1–1.0)
Monocytes Relative: 6 %
Neutro Abs: 4.5 10*3/uL (ref 1.7–7.7)
Neutrophils Relative %: 70 %
Platelets: 243 10*3/uL (ref 150–400)
RBC: 4.98 MIL/uL (ref 3.87–5.11)
RDW: 12.5 % (ref 11.5–15.5)
WBC: 6.4 10*3/uL (ref 4.0–10.5)

## 2015-03-26 LAB — BASIC METABOLIC PANEL
Anion gap: 12 (ref 5–15)
BUN: 9 mg/dL (ref 6–20)
CO2: 22 mmol/L (ref 22–32)
Calcium: 9.6 mg/dL (ref 8.9–10.3)
Chloride: 103 mmol/L (ref 101–111)
Creatinine, Ser: 0.57 mg/dL (ref 0.44–1.00)
GFR calc Af Amer: >60 mL/min (ref >60)
GFR calc non Af Amer: >60 mL/min (ref >60)
Glucose, Bld: 99 mg/dL (ref 65–99)
Potassium: 4.2 mmol/L (ref 3.5–5.1)
Sodium: 137 mmol/L (ref 135–145)

## 2015-03-26 LAB — TYPE AND SCREEN
ABO/RH(D): O POS
Antibody Screen: NEGATIVE

## 2015-03-26 SURGERY — CRANIOTOMY HYPOPHYSECTOMY TRANSNASAL APPROACH
Anesthesia: General | Site: Nose

## 2015-03-26 MED ORDER — GLYCOPYRROLATE 0.2 MG/ML IJ SOLN
INTRAMUSCULAR | Status: AC
  Filled 2015-03-26: qty 2

## 2015-03-26 MED ORDER — LIDOCAINE HCL (CARDIAC) 20 MG/ML IV SOLN
INTRAVENOUS | Status: AC
  Filled 2015-03-26: qty 5

## 2015-03-26 MED ORDER — HYDROMORPHONE HCL 1 MG/ML IJ SOLN: 0.2500 mg | INTRAMUSCULAR | Status: DC | PRN

## 2015-03-26 MED ORDER — FENTANYL CITRATE (PF) 250 MCG/5ML IJ SOLN
INTRAMUSCULAR | Status: AC
  Filled 2015-03-26: qty 10

## 2015-03-26 MED ORDER — LIDOCAINE-EPINEPHRINE 1 %-1:100000 IJ SOLN
INTRAMUSCULAR | Status: DC | PRN
  Administered 2015-03-26: 10 mL

## 2015-03-26 MED ORDER — PROPOFOL 10 MG/ML IV BOLUS
INTRAVENOUS | Status: DC | PRN
  Administered 2015-03-26: 200 mg via INTRAVENOUS

## 2015-03-26 MED ORDER — NEOSTIGMINE METHYLSULFATE 10 MG/10ML IV SOLN
INTRAVENOUS | Status: DC | PRN
  Administered 2015-03-26: 3 mg via INTRAVENOUS

## 2015-03-26 MED ORDER — EPHEDRINE SULFATE 50 MG/ML IJ SOLN
INTRAMUSCULAR | Status: DC | PRN
  Administered 2015-03-26: 10 mg via INTRAVENOUS

## 2015-03-26 MED ORDER — SODIUM CHLORIDE 0.9 % IR SOLN
Status: DC | PRN
  Administered 2015-03-26: 1000 mL

## 2015-03-26 MED ORDER — ROCURONIUM BROMIDE 50 MG/5ML IV SOLN
INTRAVENOUS | Status: AC
  Filled 2015-03-26: qty 2

## 2015-03-26 MED ORDER — SODIUM CHLORIDE 0.9 % IV SOLN
INTRAVENOUS | Status: DC | PRN
  Administered 2015-03-26: 19:00:00 via INTRAVENOUS

## 2015-03-26 MED ORDER — HYDROCORTISONE NA SUCCINATE PF 100 MG IJ SOLR
INTRAMUSCULAR | Status: AC
  Filled 2015-03-26: qty 2

## 2015-03-26 MED ORDER — ROCURONIUM BROMIDE 50 MG/5ML IV SOLN
INTRAVENOUS | Status: AC
  Filled 2015-03-26: qty 1

## 2015-03-26 MED ORDER — ROCURONIUM BROMIDE 100 MG/10ML IV SOLN
INTRAVENOUS | Status: DC | PRN
  Administered 2015-03-26 (×2): 50 mg via INTRAVENOUS

## 2015-03-26 MED ORDER — CEFAZOLIN SODIUM-DEXTROSE 2-3 GM-% IV SOLR
INTRAVENOUS | Status: AC
  Filled 2015-03-26: qty 50

## 2015-03-26 MED ORDER — SODIUM CHLORIDE 0.9 % IJ SOLN
INTRAMUSCULAR | Status: AC
  Filled 2015-03-26: qty 20

## 2015-03-26 MED ORDER — THROMBIN 20000 UNITS EX SOLR
CUTANEOUS | Status: DC | PRN
  Administered 2015-03-26: 20 mL via TOPICAL

## 2015-03-26 MED ORDER — LORAZEPAM 2 MG/ML IJ SOLN
1.0000 mg | Freq: Once | INTRAMUSCULAR | Status: AC
  Administered 2015-03-26: 1 mg via INTRAVENOUS
  Filled 2015-03-26: qty 1

## 2015-03-26 MED ORDER — NEOSTIGMINE METHYLSULFATE 10 MG/10ML IV SOLN
INTRAVENOUS | Status: AC
  Filled 2015-03-26: qty 1

## 2015-03-26 MED ORDER — PROPOFOL 10 MG/ML IV BOLUS
INTRAVENOUS | Status: AC
  Filled 2015-03-26: qty 20

## 2015-03-26 MED ORDER — MUPIROCIN CALCIUM 2 % NA OINT
TOPICAL_OINTMENT | NASAL | Status: DC | PRN
  Administered 2015-03-26: 1 via NASAL

## 2015-03-26 MED ORDER — HYDROCORTISONE NA SUCCINATE PF 100 MG IJ SOLR
INTRAMUSCULAR | Status: DC | PRN
  Administered 2015-03-26: 100 mg via INTRAVENOUS

## 2015-03-26 MED ORDER — SUFENTANIL CITRATE 50 MCG/ML IV SOLN
INTRAVENOUS | Status: AC
  Filled 2015-03-26: qty 1

## 2015-03-26 MED ORDER — MIDAZOLAM HCL 2 MG/2ML IJ SOLN
INTRAMUSCULAR | Status: AC
  Filled 2015-03-26: qty 2

## 2015-03-26 MED ORDER — LIDOCAINE HCL (CARDIAC) 20 MG/ML IV SOLN
INTRAVENOUS | Status: DC | PRN
  Administered 2015-03-26: 100 mg via INTRAVENOUS

## 2015-03-26 MED ORDER — LACTATED RINGERS IV SOLN
INTRAVENOUS | Status: DC | PRN
  Administered 2015-03-26 (×2): via INTRAVENOUS

## 2015-03-26 MED ORDER — ONDANSETRON HCL 4 MG/2ML IJ SOLN
INTRAMUSCULAR | Status: DC | PRN
  Administered 2015-03-26: 4 mg via INTRAVENOUS

## 2015-03-26 MED ORDER — SUFENTANIL CITRATE 50 MCG/ML IV SOLN
INTRAVENOUS | Status: DC | PRN
  Administered 2015-03-26: 15 ug via INTRAVENOUS
  Administered 2015-03-26 (×2): 10 ug via INTRAVENOUS
  Administered 2015-03-26: 15 ug via INTRAVENOUS
  Administered 2015-03-26: 20 ug via INTRAVENOUS

## 2015-03-26 MED ORDER — ONDANSETRON HCL 4 MG/2ML IJ SOLN
INTRAMUSCULAR | Status: AC
  Filled 2015-03-26: qty 2

## 2015-03-26 MED ORDER — CEFAZOLIN SODIUM-DEXTROSE 2-3 GM-% IV SOLR
INTRAVENOUS | Status: DC | PRN
  Administered 2015-03-26: 2 g via INTRAVENOUS

## 2015-03-26 MED ORDER — THROMBIN 5000 UNITS EX SOLR
OROMUCOSAL | Status: DC | PRN
  Administered 2015-03-26: 5 mL via TOPICAL

## 2015-03-26 MED ORDER — GLYCOPYRROLATE 0.2 MG/ML IJ SOLN
INTRAMUSCULAR | Status: DC | PRN
  Administered 2015-03-26: 0.4 mg via INTRAVENOUS

## 2015-03-26 MED ORDER — MIDAZOLAM HCL 5 MG/5ML IJ SOLN
INTRAMUSCULAR | Status: DC | PRN
  Administered 2015-03-26: 2 mg via INTRAVENOUS

## 2015-03-26 MED ORDER — GADOBENATE DIMEGLUMINE 529 MG/ML IV SOLN
13.0000 mL | Freq: Once | INTRAVENOUS | Status: AC | PRN
  Administered 2015-03-26: 13 mL via INTRAVENOUS

## 2015-03-26 MED ORDER — PROMETHAZINE HCL 25 MG/ML IJ SOLN
6.2500 mg | INTRAMUSCULAR | Status: DC | PRN
Start: 2015-03-26 — End: 2015-03-26

## 2015-03-26 MED ORDER — OXYMETAZOLINE HCL 0.05 % NA SOLN
NASAL | Status: DC | PRN
  Administered 2015-03-26: 1 via TOPICAL

## 2015-03-26 SURGICAL SUPPLY — 111 items
ALLODERM MED 2X4 (Tissue) ×2 IMPLANT
ATTRACTOMAT 16X20 MAGNETIC DRP (DRAPES) ×4 IMPLANT
BAG DECANTER FOR FLEXI CONT (MISCELLANEOUS) ×2 IMPLANT
BENZOIN TINCTURE PRP APPL 2/3 (GAUZE/BANDAGES/DRESSINGS) ×2 IMPLANT
BLADE ROTATE RAD 40 4 M4 (BLADE) IMPLANT
BLADE ROTATE TRICUT 4X13 M4 (BLADE) ×2 IMPLANT
BLADE SURG 10 STRL SS (BLADE) ×2 IMPLANT
BLADE SURG 11 STRL SS (BLADE) ×4 IMPLANT
BLADE SURG 15 STRL LF DISP TIS (BLADE) ×2 IMPLANT
BLADE SURG 15 STRL SS (BLADE) ×2
BRUSH SCRUB EZ PLAIN DRY (MISCELLANEOUS) ×2 IMPLANT
BUR 2.5 MTCH HD 16 (BUR) ×2 IMPLANT
CANISTER SUCT 3000ML PPV (MISCELLANEOUS) ×4 IMPLANT
COAGULATOR SUCT SWTCH 10FR 6 (ELECTROSURGICAL) ×2 IMPLANT
CORDS BIPOLAR (ELECTRODE) ×2 IMPLANT
COVER BACK TABLE 60X90IN (DRAPES) IMPLANT
COVER MAYO STAND STRL (DRAPES) ×2 IMPLANT
CRADLE DONUT ADULT HEAD (MISCELLANEOUS) IMPLANT
DECANTER SPIKE VIAL GLASS SM (MISCELLANEOUS) ×2 IMPLANT
DRAPE EENT ADH APERT 15X15 STR (DRAPES) IMPLANT
DRAPE INCISE IOBAN 66X45 STRL (DRAPES) ×2 IMPLANT
DRAPE MICROSCOPE LEICA (MISCELLANEOUS) ×2 IMPLANT
DRAPE POUCH INSTRU U-SHP 10X18 (DRAPES) ×2 IMPLANT
DRAPE PROXIMA HALF (DRAPES) ×4 IMPLANT
DRAPE SURG 17X23 STRL (DRAPES) ×6 IMPLANT
DRESSING NASAL POPE 10X1.5X2.5 (GAUZE/BANDAGES/DRESSINGS) IMPLANT
DRSG NASAL POPE 10X1.5X2.5 (GAUZE/BANDAGES/DRESSINGS)
DRSG NASOPORE 8CM (GAUZE/BANDAGES/DRESSINGS) ×2 IMPLANT
DURASEAL SPINE SEALANT 3ML (MISCELLANEOUS) IMPLANT
ELECT COATED BLADE 2.86 ST (ELECTRODE) ×2 IMPLANT
ELECT NEEDLE TIP 2.8 STRL (NEEDLE) ×2 IMPLANT
ELECT REM PT RETURN 9FT ADLT (ELECTROSURGICAL) ×2
ELECTRODE REM PT RTRN 9FT ADLT (ELECTROSURGICAL) ×1 IMPLANT
FILTER ARTHROSCOPY CONVERTOR (FILTER) IMPLANT
FLOSEAL (HEMOSTASIS) IMPLANT
GAUZE PACKING FOLDED 2  STR (GAUZE/BANDAGES/DRESSINGS) ×1
GAUZE PACKING FOLDED 2 STR (GAUZE/BANDAGES/DRESSINGS) ×1 IMPLANT
GAUZE SPONGE 2X2 8PLY STRL LF (GAUZE/BANDAGES/DRESSINGS) ×1 IMPLANT
GAUZE SPONGE 4X4 12PLY STRL (GAUZE/BANDAGES/DRESSINGS) ×2 IMPLANT
GLOVE BIO SURGEON STRL SZ 6.5 (GLOVE) ×4 IMPLANT
GLOVE BIO SURGEON STRL SZ7 (GLOVE) ×2 IMPLANT
GLOVE BIOGEL PI IND STRL 7.5 (GLOVE) ×1 IMPLANT
GLOVE BIOGEL PI INDICATOR 7.5 (GLOVE) ×1
GLOVE ECLIPSE 6.5 STRL STRAW (GLOVE) ×4 IMPLANT
GLOVE ECLIPSE 7.0 STRL STRAW (GLOVE) ×2 IMPLANT
GLOVE EXAM NITRILE LRG STRL (GLOVE) IMPLANT
GLOVE EXAM NITRILE XL STR (GLOVE) IMPLANT
GLOVE EXAM NITRILE XS STR PU (GLOVE) IMPLANT
GLOVE INDICATOR 6.5 STRL GRN (GLOVE) ×8 IMPLANT
GLOVE SURG SS PI 7.5 STRL IVOR (GLOVE) ×2 IMPLANT
GOWN STRL NON-REIN LRG LVL3 (GOWN DISPOSABLE) ×4 IMPLANT
GOWN STRL REUS W/ TWL LRG LVL3 (GOWN DISPOSABLE) ×6 IMPLANT
GOWN STRL REUS W/ TWL XL LVL3 (GOWN DISPOSABLE) IMPLANT
GOWN STRL REUS W/TWL 2XL LVL3 (GOWN DISPOSABLE) ×2 IMPLANT
GOWN STRL REUS W/TWL LRG LVL3 (GOWN DISPOSABLE) ×6
GOWN STRL REUS W/TWL XL LVL3 (GOWN DISPOSABLE)
HEMOSTAT SURGICEL 2X14 (HEMOSTASIS) IMPLANT
KIT BASIN OR (CUSTOM PROCEDURE TRAY) ×4 IMPLANT
KIT DRAIN CSF ACCUDRAIN (MISCELLANEOUS) IMPLANT
KIT ROOM TURNOVER OR (KITS) ×4 IMPLANT
NEEDLE HYPO 25X1 1.5 SAFETY (NEEDLE) ×2 IMPLANT
NEEDLE SPNL 22GX3.5 QUINCKE BK (NEEDLE) ×2 IMPLANT
NEEDLE SPNL 25GX3.5 QUINCKE BL (NEEDLE) IMPLANT
NS IRRIG 1000ML POUR BTL (IV SOLUTION) ×4 IMPLANT
PAD ARMBOARD 7.5X6 YLW CONV (MISCELLANEOUS) ×6 IMPLANT
PATTIES SURGICAL .25X.25 (GAUZE/BANDAGES/DRESSINGS) IMPLANT
PATTIES SURGICAL .5 X.5 (GAUZE/BANDAGES/DRESSINGS) ×2 IMPLANT
PATTIES SURGICAL .5 X3 (DISPOSABLE) ×4 IMPLANT
PENCIL BUTTON HOLSTER BLD 10FT (ELECTRODE) ×2 IMPLANT
PENCIL FOOT CONTROL (ELECTRODE) IMPLANT
RUBBERBAND STERILE (MISCELLANEOUS) ×4 IMPLANT
SHEATH ENDOSCRUB 0 DEG (SHEATH) IMPLANT
SHEATH ENDOSCRUB 30 DEG (SHEATH) IMPLANT
SHEATH ENDOSCRUB 45 DEG (SHEATH) IMPLANT
SHEET SIL 040 (INSTRUMENTS) IMPLANT
SOLUTION ANTI FOG 6CC (MISCELLANEOUS) ×2 IMPLANT
SPECIMEN JAR SMALL (MISCELLANEOUS) ×4 IMPLANT
SPLINT NASAL DOYLE BI-VL (GAUZE/BANDAGES/DRESSINGS) IMPLANT
SPONGE GAUZE 2X2 STER 10/PKG (GAUZE/BANDAGES/DRESSINGS) ×1
SPONGE LAP 4X18 X RAY DECT (DISPOSABLE) ×2 IMPLANT
SPONGE SURGIFOAM ABS GEL 12-7 (HEMOSTASIS) IMPLANT
STAPLER SKIN PROX WIDE 3.9 (STAPLE) ×2 IMPLANT
STRIP CLOSURE SKIN 1/2X4 (GAUZE/BANDAGES/DRESSINGS) ×4 IMPLANT
SUT BONE WAX W31G (SUTURE) ×2 IMPLANT
SUT CHROMIC 3 0 PS 2 (SUTURE) ×4 IMPLANT
SUT CHROMIC 4 0 P 3 18 (SUTURE) IMPLANT
SUT CHROMIC 4 0 PS 2 18 (SUTURE) ×2 IMPLANT
SUT CHROMIC 5 0 P 3 (SUTURE) IMPLANT
SUT ETHILON 3 0 PS 1 (SUTURE) IMPLANT
SUT ETHILON 4 0 PS 2 18 (SUTURE) ×4 IMPLANT
SUT ETHILON 5 0 P 3 18 (SUTURE)
SUT ETHILON 6 0 P 1 (SUTURE) IMPLANT
SUT NYLON ETHILON 5-0 P-3 1X18 (SUTURE) IMPLANT
SUT PLAIN 4 0 ~~LOC~~ 1 (SUTURE) ×2 IMPLANT
SUT SILK 2 0 FS (SUTURE) IMPLANT
SUT VIC AB 4-0 P-3 18X BRD (SUTURE) ×1 IMPLANT
SUT VIC AB 4-0 P3 18 (SUTURE) ×1
SWAB COLLECTION DEVICE MRSA (MISCELLANEOUS) IMPLANT
SYR 50ML SLIP (SYRINGE) IMPLANT
SYR CONTROL 10ML LL (SYRINGE) ×2 IMPLANT
SYRINGE 10CC LL (SYRINGE) ×2 IMPLANT
TOWEL OR 17X24 6PK STRL BLUE (TOWEL DISPOSABLE) ×4 IMPLANT
TOWEL OR 17X26 10 PK STRL BLUE (TOWEL DISPOSABLE) ×4 IMPLANT
TRACKER ENT INSTRUMENT (MISCELLANEOUS) ×2 IMPLANT
TRACKER ENT PATIENT (MISCELLANEOUS) ×4 IMPLANT
TRAY ENT MC OR (CUSTOM PROCEDURE TRAY) ×4 IMPLANT
TRAY FOLEY W/METER SILVER 14FR (SET/KITS/TRAYS/PACK) ×2 IMPLANT
TUBE CONNECTING 12X1/4 (SUCTIONS) ×2 IMPLANT
TUBING STRAIGHTSHOT EPS 5PK (TUBING) ×2 IMPLANT
WATER STERILE IRR 1000ML POUR (IV SOLUTION) ×4 IMPLANT
WIPE INSTRUMENT VISIWIPE 73X73 (MISCELLANEOUS) ×2 IMPLANT

## 2015-03-26 NOTE — ED Notes (Signed)
Pt to Neuro OR. Family has all belongings.

## 2015-03-26 NOTE — Transfer of Care (Signed)
Immediate Anesthesia Transfer of Care Note  Patient: Alyssa Bartlett  Procedure(s) Performed: Procedure(s): CRANIOTOMY HYPOPHYSECTOMY TRANSNASAL APPROACH (N/A) ENDOSCOPIC SINUS SURGERY WITH NAVIGATION (N/A)  Patient Location: ICU  Anesthesia Type:General  Level of Consciousness: sedated, patient cooperative and responds to stimulation  Airway & Oxygen Therapy: Patient Spontanous Breathing and Patient connected to face mask oxygen  Post-op Assessment: Report given to RN, Post -op Vital signs reviewed and stable and Patient moving all extremities X 4  Post vital signs: Reviewed and stable  Last Vitals:  Filed Vitals:   03/26/15 1830  BP: 126/90  Pulse: 89  Temp:   Resp: 15    Complications: No apparent anesthesia complications

## 2015-03-26 NOTE — ED Notes (Signed)
Neuro MD at bedside

## 2015-03-26 NOTE — H&P (Signed)
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 She now presents for transsphenoidal resection of pituitary mass with Neurosurgery.  Dr. Simeon Craft, [place] Dust has discussed the risks, benefits, and alternatives of this procedure. The patient understands the risks and would like to proceed with the procedure. The chances of success of the procedure are >50% and the patient understands this. I personally performed an examination of the patient within 24 hours of the procedure.  PAST MEDICAL HISTORY: Past Medical History  Diagnosis Date  . Hypothyroidism     managed by [facility]  . Mood disorder (HCC)     GAD, panic, depression  . History of chicken pox   . Allergic reaction to food     ? shellfish  . Prediabetes   . Mild hyperlipidemia   . Tobacco use     PAST SURGICAL HISTORY: History reviewed. No pertinent past surgical history.  MEDICATIONS: Current Facility-Administered Medications on File Prior to Encounter  Medication Dose Route Frequency Provider Last Rate Last Dose  . 0.9 %  sodium chloride infusion    Continuous PRN Claris Che, CRNA   Stopped at 03/26/15 2135  . ceFAZolin (ANCEF) IVPB 2 g/50 mL premix   Intravenous Anesthesia Intra-op Claris Che, CRNA   2 g at 03/26/15 1950  . ePHEDrine injection    Anesthesia Intra-op Claris Che, CRNA   10 mg at 03/26/15 2135  . glycopyrrolate (ROBINUL) injection    Anesthesia Intra-op Claris Che, CRNA   0.4 mg at 03/26/15 2155  . hydrocortisone sodium succinate (SOLU-CORTEF) 100 MG injection    Anesthesia Intra-op Claris Che, CRNA   100 mg at 03/26/15 1950  . lactated ringers infusion    Continuous PRN Claris Che, CRNA 0 mL/hr at 03/26/15 2056    . lidocaine (cardiac) 100 mg/2ml (XYLOCAINE) 20 MG/ML injection 2%    Anesthesia Intra-op  Claris Che, CRNA   100 mg at 03/26/15 1916  . midazolam (VERSED) 5 MG/5ML injection    Anesthesia Intra-op Claris Che, CRNA   2 mg at 03/26/15 1908  . neostigmine (BLOXIVERZ) injection   Intravenous Anesthesia Intra-op Claris Che, CRNA   3 mg at 03/26/15 2155  . ondansetron [facility]) injection    Anesthesia Intra-op Claris Che, CRNA   4 mg at 03/26/15 2155  . propofol (DIPRIVAN) 10 mg/mL bolus/IV push    Anesthesia Intra-op Claris Che, CRNA   200 mg at 03/26/15 1916  . rocuronium [facility]) injection    Anesthesia Intra-op Claris Che, CRNA   50 mg at 03/26/15 2013  . SUFentanil (SUFENTA) injection    Anesthesia Intra-op Claris Che, CRNA   10 mcg at 03/26/15 2049   Current Outpatient Prescriptions on File Prior to Encounter  Medication Sig Dispense Refill  . LORazepam (ATIVAN) 0.5 MG tablet Take 1 tablet (0.5 mg total) by mouth 2 (two) times daily. Only as needed for severe anxiety. Use a little as possible. 15 tablet 0  . SYNTHROID 125 MCG tablet Take 1 tablet (125 mcg total) by mouth daily before breakfast. 45 tablet 1  . SYNTHROID 150 MCG tablet Take 1 tablet (150 mcg total) by mouth daily before breakfast. (Patient not taking: Reported on 03/26/2015) 45 tablet 1  . venlafaxine XR (EFFEXOR-XR) 37.5 MG 24 hr capsule Take 1 capsule (37.5 mg  total) by mouth daily with breakfast. (Patient not taking: Reported on 03/26/2015) 30 capsule 2    ALLERGIES: Allergies  Allergen Reactions  . Shrimp [Shellfish Allergy]     unknown    SOCIAL HISTORY: Social History   Social History  . Marital Status: Single    Spouse Name: N/A  . Number of Children: N/A  . 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 Smoking status: Former Smoker -- 1.00 packs/day for 14 years    Types: Cigarettes    Start date: 07/20/2008  . Smokeless tobacco: Not on file  . Alcohol Use: Yes     Comment: wine  . Drug Use: No  . Sexual  Activity: Not on file   Other Topics Concern  . 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 Allergic rhinitis Mother   . Allergic rhinitis Father   . Prostate cancer Father   . Hyperlipidemia Mother   . 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 Patient understands the risks, benefits, and alternatives. Informed written consent signed, witnessed, and on chart. 03/26/2015  10:26 PM Alyssa Bartlett

## 2015-03-26 NOTE — Progress Notes (Signed)
eLink Physician-Brief Progress Note Patient Name: Alyssa Bartlett DOB: Nov 06, 1970 MRN: [id number]   Date of Service  03/26/2015  HPI/Events of Note  51 F s/p endoscopic transsphenoidal resection of pituitary tumor.  Patient is HD stable with sats of 96% on RA.  eICU Interventions  Plan of care per primary surgeon. Continue to monitor via [facility]     Intervention Category Evaluation Type: New Patient Evaluation  DETERDING,ELIZABETH 03/26/2015, 11:35 PM

## 2015-03-26 NOTE — Op Note (Addendum)
10:05 PM  03/26/2015  Surgeon: Lowry Ram, MD  Neurosurgery: Consuella Lose, MD  Procedures Performed: [phone] resection of pituitary tumor, two surgeons 20926-tissue grafts, other-right middle turbinate graft for sellar reconstruction 30520-endoscopic septoplasty 61782-CT image guidance 31255-50 bilateral total ethmoidectomies 31267-right right maxillary antrostomy with tissue removal 31288-50 bilateral sphenoidotomies with tissue removal  Operative Findings: normal sinus anatomy, no CSF leak, gross total resection of sellar hemorrhage and pituitary mass, no skull base or orbital injury. Septoplasty necessary for adequate visualization of the sphenoids and sella. Sella reconstructed with alloderm inlay and right middle turbinate onlay grafts.  Specimens: right and left sinus contents, pituitary mass  PREOPERATIVE DIAGNOSIS: Pituitary mass with apoplexy, septal deviation  POSTOPERATIVE DIAGNOSIS: Pituitary mass with apoplexy, septal deviation   ANESTHESIA: General endotracheal.  ESTIMATED BLOOD LOSS: less than 250 mL.  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  PROCEDURE: The patient was brought to the operating room and placed in the supine position. After adequate endotracheal anesthesia was obtained, the skin was draped in sterile fashion. Lidocaine 1% with 1:100,000 epinephrine was injected into the bilateral greater palatine foramina and the nasal septum. Approximately 10 cc total was used. The patient was placed in the [place] in pins and then registered to the [employer] fusion image guidance system/MRI with good accuracy and precision.  There was a significant right septal deviation which precluded access to the right ethmoid and sphenoid sinuses. To obtain adequate visualization endoscopic septoplasty was performed by making a right Killian incision  and elevating a right submucosal flap using the 0 scope and cottle. I then made a vertical cartilaginous incision and then elevated a left sided submucosal flap. I removed the deviated portions of the cartilage and bone, leaving a 1cm superior and anterior septal strut, and then reapproximated the septal flaps with the 4-0 plain gut suture on a Keith needle.   Attention then was directed toward the left sinuses. The anterior and posterior ethmoid air cells were entered after identifying the left ethmoid bulla using the image guidance suction and dissecting up to the skull base and out to the lamina papyrecea using the 55 degree curette and the 77mm Kerrison punch. The ethmoid cells were meticulously dissected out using the Kerrison and debrider. The skull base and lamina papyracea were preserved throughout.   The left sphenoid natural ostium was then identified using the image guidance suction and the left sphenoid was widely opened using the 34mm Kerrison all the way to the skull base and lamina papyracea.  Attention then was directed toward the right sinuses. Lidocaine 1% with 1:100,000 epinephrine was injected in the region of the anterior portion of the left middle turbinate and uncinate process. The uncinate process was identified using the 0 degree endoscope and the right uncinate process was  removed systematically superiorly to inferiorly with back-biting forceps. The right middle turbinate was removed and placed in saline after the mucosa was removed and marked with blue sterile ink. Next, the right maxillary sinus was widely opened using the microdebrider. I meticulously opened the right maxillary sinus including the right maxillary natural ostium using the microdebrider and suctioned out the sinus. I gently cauterized the stump of the right middle turbinate.  The right anterior and posterior ethmoid air cells were entered after identifying the right ethmoid bulla using the image guidance suction  and dissected up to the skull base and out to the lamina papyrecea using the 55 degree  curette and the 43mm Kerrison punch. All of the ethmoid cells were meticulously dissected out using the Kerrison and debrider. The skull base and lamina papyracea were preserved throughout.   The right sphenoid natural ostium was then identified using the image guidance suction and the right sphenoid was widely opened using the 10mm Kerrison all the way to the skull base and lamina papyracea. I reflected the posterior septal mucosa inferiorly bilaterally to preserve the bilateral nasoseptal flap pedicles, and used the Kerrison and Blakesley to remove the entire sphenoid rostrum and the sphenoid intersinus septum until the sella and the clival recess were widely patent and visible.   At this point Dr. Kathyrn Sheriff came in and used the drill and then the [facility] to open the sella, and then used the knife to open the sellar dura. While I held the scope for visualization Dr. Kathyrn Sheriff used various instrumentation to remove the pituitary hemorrhage and serous fluid, and then to remove the pituitary adenoma until the sellar floor, diaphragma, and lateral walls were all clear of tumor. No CSF leak was noted.  A thorough irrigation was then carried out in the nasal cavity> I reconstructed the sellar face with surgiflo and then an alloderm inlay graft under the dura, and then a right middle turbinate mucosal graft onlay with the mucosa facing outward. I then bolstered the grafts with surgiflo, gelfoam, and then mupirocin-coated nasopore. The patient's stomach was suctioned out using a flexible OGtube. The patient was awakened from anesthesia, taken out of the pins/headpiece, and extubated without difficulty. The patient tolerated the procedure well and returned to the recovery room in stable condition.   Dr. Ruby Cola and Dr. Kathyrn Sheriff were present and performed the entire procedure. 10:05 PM Ruby Cola 03/26/2015

## 2015-03-26 NOTE — Op Note (Signed)
PREOP DIAGNOSIS:  1. Pituitary apoplexy   POSTOP DIAGNOSIS:  Same  PROCEDURE: 1. Endoscopic transsphenoidal approach for resection of pituitary tumor  SURGEON: Dr. Consuella Lose, MD  CO-SURGEON: Dr. Ruby Cola, MD  ANESTHESIA: General Endotracheal  EBL: 100cc  SPECIMENS: Pituitary tumor  DRAINS: None  COMPLICATIONS: None immediate  CONDITION: Hemodynamically stable to PACU  HISTORY: Alyssa Bartlett is a 44 y.o. female presenting to the ED with approximately 14 days of slow but progressive visual loss. She was recently seen by her ophthalmologist where visual field testing was done demonstrating bitemporal quadrantic defect. MRI was ordered demonstrating pituitary apoplexy and she was therefore asked to come to the ER. Given the visual loss and MRI findings, urgent surgical decompression was indicated.  PROCEDURE IN DETAIL: After informed consent was obtained and witnessed, the patient was brought to the operating room. After induction of general anesthesia, the patient was positioned on the operative table in the supine position. All pressure points were meticulously padded. The patient was placed in the [place] head holder and affixed to the table. Skin of the right upper and lower quadrant was prepped for possible need for abdominal fat graft.  After timeout was conducted, approach to the sella was carried out through a transnasal approach with endoscopic assistance by Dr. Simeon Craft. The details of this approach are dictated in a separate report.  Once the face of the sella was identified, high-speed drill was used to create a small opening in the bony sella. The face and floor of the sella was then carefully removed using a combination of rongeurs. The anterior dura of the sella was then opened in cruciate fashion. Immediately upon opening the dura, xanthochromic fluid was encountered and drained under mild pressure. At this point, visualization of the more posterior aspect of  the sella demonstrated what appeared to be normal gland near the posterior and inferior portion of the hypophysis, while there was dark blue tissue, likely adenoma more anteriorly and superiorly, as well as around the lateral walls of the sella. Portions of this tissue were taken and sent for permanent pathology. Using multiple curettes, this adenoma tissue was carefully removed from the lateral walls of the sella, as well as the floor of the sella. The clivus was palpated posteriorly. Similarly, angled curettes were used to remove any remaining tumor superiorly. A 45 angled endoscope was then introduced, and the superior aspect of the hypophysis was inspected. There did not appear to be any remaining tumor.  Having completed the resection and decompression, the sella and nasopharynx was reconstructed by Dr. Simeon Craft, the details of which again are dictated in a separate report.  At the end of the case all sponge, needle, cottonoid, and instrument counts were correct. The patient was then removed from the [place] head holder, extubated, transferred to the stretcher, and taken to the postanesthesia care unit in stable hemodynamic condition.

## 2015-03-26 NOTE — H&P (Signed)
CC:  Chief Complaint  Patient presents with  . Visual Field Change    HPI: Alyssa Bartlett is a 44 y.o. female sent to the ED by her ophthalmologist after history beginning about 14d ago. She says she initially noted problem with her right visual field, describing blurry vision in the periphery on the right side. She also noted a mild right sided HA at the same time. She denies new N/T/W. She says the HA and vision have been stable to slightly worsened over the last 2 weeks. She does feel "run down," but this has been persistent for quite some time likely because of her hypothyroidism.  PMH: Past Medical History  Diagnosis Date  . Hypothyroidism     managed by [facility]  . Mood disorder (HCC)     GAD, panic, depression  . History of chicken pox   . Allergic reaction to food     ? shellfish  . Prediabetes   . Mild hyperlipidemia   . Tobacco use     PSH: History reviewed. No pertinent past surgical history.  SH: Social History  Substance Use Topics  . Smoking status: Former Smoker -- 1.00 packs/day for 14 years    Types: Cigarettes    Start date: 07/20/2008  . Smokeless tobacco: None  . Alcohol Use: Yes     Comment: wine    MEDS: Prior to Admission medications   Medication Sig Start Date End Date Taking? Authorizing Provider  LORazepam (ATIVAN) 0.5 MG tablet Take 1 tablet (0.5 mg total) by mouth 2 (two) times daily. Only as needed for severe anxiety. Use a little as possible. 03/12/15  Yes Lucretia Kern, DO  Polyvinyl Alcohol-Povidone (REFRESH OP) Place 1 drop into both eyes.   Yes Historical Provider, MD  SYNTHROID 125 MCG tablet Take 1 tablet (125 mcg total) by mouth daily before breakfast. 02/19/15  Yes Philemon Kingdom, MD  SYNTHROID 150 MCG tablet Take 1 tablet (150 mcg total) by mouth daily before breakfast. Patient not taking: Reported on 03/26/2015 01/04/15   Philemon Kingdom, MD  venlafaxine XR (EFFEXOR-XR) 37.5 MG 24 hr capsule Take 1 capsule (37.5  mg total) by mouth daily with breakfast. Patient not taking: Reported on 03/26/2015 01/07/15   Lucretia Kern, DO    ALLERGY: Allergies  Allergen Reactions  . Shrimp [Shellfish Allergy]     unknown    ROS: ROS  NEUROLOGIC EXAM: Awake, alert, oriented Memory and concentration grossly intact Speech fluent, appropriate CN grossly intact Motor exam: Upper Extremities Deltoid Bicep Tricep Grip  Right 5/5 5/5 5/5 5/5  Left 5/5 5/5 5/5 5/5   Lower Extremity IP Quad PF DF EHL  Right 5/5 5/5 5/5 5/5 5/5  Left 5/5 5/5 5/5 5/5 5/5   Sensation grossly intact to LT Formal visual field testing was reviewed demonstrating bitemporal superior quadrantanopsia (attached below)  IMGAING: MRI demonstrates sellar/suprasellar mass with associated hemorrhage and compression of the optic chiasm.  IMPRESSION: - 44 y.o. female with pituitary apoplexy with visual field cut, neurologically and hemodynamically stable  PLAN: - Will plan on transsphenoidal approach for evacuation of hematoma, decompression of optic apparatus, and resection/biopsy of possible underlying tumor - Will give 100mg  Hydrocortisone  I have reviewed the MRI findings with the patient and her family. Given the apoplexy and her visual symptoms, I did recommend urgent surgical evacuation/decompression. The risks of surgery were discussed including carotid injury leading to bleeding/stroke/coma/death, CSF leak, endocrine dysfunction, and incomplete resection. All questions were answered,  and consent was obtained.

## 2015-03-26 NOTE — ED Notes (Signed)
The pt has had vision proiblems for approix 2 weeks she had a mri earlier toiday and was told she had a pituitary hemorrhage   She was told to come here for admission

## 2015-03-26 NOTE — Anesthesia Procedure Notes (Signed)
Procedure Name: Intubation Date/Time: 03/26/2015 7:18 PM Performed by: Claris Che Pre-anesthesia Checklist: Patient identified, Emergency Drugs available, Suction available, Patient being monitored and Timeout performed Patient Re-evaluated:Patient Re-evaluated prior to inductionOxygen Delivery Method: Circle system utilized Preoxygenation: Pre-oxygenation with 100% oxygen Intubation Type: IV induction Ventilation: Mask ventilation without difficulty Laryngoscope Size: Mac and 3 Grade View: Grade II Tube type: Subglottic suction tube Tube size: 7.5 mm Number of attempts: 1 Airway Equipment and Method: Stylet Placement Confirmation: ETT inserted through vocal cords under direct vision and positive ETCO2 Secured at: 24 cm Tube secured with: Tape Dental Injury: Teeth and Oropharynx as per pre-operative assessment

## 2015-03-26 NOTE — Anesthesia Preprocedure Evaluation (Addendum)
Anesthesia Evaluation  Patient identified by MRN, date of birth, ID band Patient awake    Reviewed: Allergy & Precautions, NPO status , Patient's Chart, lab work & pertinent test results  Airway Mallampati: II  TM Distance: >3 FB Neck ROM: Full    Dental no notable dental hx. (+) Dental Advisory Given, Teeth Intact   Pulmonary neg pulmonary ROS, former smoker,    Pulmonary exam normal breath sounds clear to auscultation       Cardiovascular negative cardio ROS Normal cardiovascular exam Rhythm:Regular Rate:Normal     Neuro/Psych negative neurological ROS  negative psych ROS   GI/Hepatic negative GI ROS, Neg liver ROS,   Endo/Other  Hypothyroidism   Renal/GU negative Renal ROS  negative genitourinary   Musculoskeletal negative musculoskeletal ROS (+)   Abdominal   Peds negative pediatric ROS (+)  Hematology negative hematology ROS (+)   Anesthesia Other Findings   Reproductive/Obstetrics negative OB ROS                           Anesthesia Physical Anesthesia Plan  ASA: II  Anesthesia Plan: General   Post-op Pain Management:    Induction: Intravenous  Airway Management Planned: Oral ETT  Additional Equipment: Arterial line  Intra-op Plan:   Post-operative Plan: Extubation in OR  Informed Consent: I have reviewed the patients History and Physical, chart, labs and discussed the procedure including the risks, benefits and alternatives for the proposed anesthesia with the patient or authorized representative who has indicated his/her understanding and acceptance.   Dental advisory given  Plan Discussed with: CRNA, Surgeon and Anesthesiologist  Anesthesia Plan Comments:        Anesthesia Quick Evaluation

## 2015-03-27 LAB — BASIC METABOLIC PANEL
Anion gap: 9 (ref 5–15)
CHLORIDE: 107 mmol/L (ref 101–111)
CO2: 24 mmol/L (ref 22–32)
CREATININE: 0.56 mg/dL (ref 0.44–1.00)
Calcium: 8.9 mg/dL (ref 8.9–10.3)
GFR calc Af Amer: >60 mL/min (ref >60)
GFR calc non Af Amer: >60 mL/min (ref >60)
GLUCOSE: 144 mg/dL — AB (ref 65–99)
POTASSIUM: 3.9 mmol/L (ref 3.5–5.1)
SODIUM: 140 mmol/L (ref 135–145)

## 2015-03-27 LAB — ABO/RH: ABO/RH(D): O POS

## 2015-03-27 LAB — CORTISOL-AM, BLOOD: CORTISOL - AM: 53.6 ug/dL — AB (ref 6.7–22.6)

## 2015-03-27 MED ORDER — MORPHINE SULFATE (PF) 2 MG/ML IV SOLN
1.0000 mg | INTRAVENOUS | Status: DC | PRN
  Administered 2015-03-27 (×3): 2 mg via INTRAVENOUS
  Administered 2015-03-27: 1 mg via INTRAVENOUS
  Filled 2015-03-27 (×4): qty 1

## 2015-03-27 MED ORDER — CEFAZOLIN SODIUM 1-5 GM-% IV SOLN
1.0000 g | Freq: Three times a day (TID) | INTRAVENOUS | Status: AC
  Administered 2015-03-27 (×2): 1 g via INTRAVENOUS
  Filled 2015-03-27 (×2): qty 50

## 2015-03-27 MED ORDER — POLYVINYL ALCOHOL 1.4 % OP SOLN
1.0000 [drp] | Freq: Two times a day (BID) | OPHTHALMIC | Status: DC
  Administered 2015-03-27 (×2): 1 [drp] via OPHTHALMIC
  Filled 2015-03-27: qty 15

## 2015-03-27 MED ORDER — BISACODYL 10 MG RE SUPP: 10.0000 mg | Freq: Every day | RECTAL | Status: DC | PRN

## 2015-03-27 MED ORDER — SENNA 8.6 MG PO TABS
1.0000 | ORAL_TABLET | Freq: Two times a day (BID) | ORAL | Status: DC
  Administered 2015-03-27 – 2015-03-28 (×4): 8.6 mg via ORAL
  Filled 2015-03-27 (×4): qty 1

## 2015-03-27 MED ORDER — LORAZEPAM 0.5 MG PO TABS
0.5000 mg | ORAL_TABLET | Freq: Two times a day (BID) | ORAL | Status: DC
  Administered 2015-03-27 – 2015-03-29 (×5): 0.5 mg via ORAL
  Filled 2015-03-27 (×6): qty 1

## 2015-03-27 MED ORDER — LEVOTHYROXINE SODIUM 25 MCG PO TABS
125.0000 ug | ORAL_TABLET | Freq: Every day | ORAL | Status: DC
  Administered 2015-03-27 – 2015-03-29 (×3): 125 ug via ORAL
  Filled 2015-03-27 (×4): qty 1

## 2015-03-27 MED ORDER — ONDANSETRON HCL 4 MG PO TABS: 4.0000 mg | ORAL_TABLET | ORAL | Status: DC | PRN

## 2015-03-27 MED ORDER — HYDROCODONE-ACETAMINOPHEN 5-325 MG PO TABS
1.0000 | ORAL_TABLET | ORAL | Status: DC | PRN
  Administered 2015-03-27 – 2015-03-28 (×5): 1 via ORAL
  Filled 2015-03-27 (×5): qty 1

## 2015-03-27 MED ORDER — DOCUSATE SODIUM 100 MG PO CAPS
100.0000 mg | ORAL_CAPSULE | Freq: Two times a day (BID) | ORAL | Status: DC
  Administered 2015-03-27 – 2015-03-29 (×5): 100 mg via ORAL
  Filled 2015-03-27 (×5): qty 1

## 2015-03-27 MED ORDER — PANTOPRAZOLE SODIUM 40 MG PO TBEC
40.0000 mg | DELAYED_RELEASE_TABLET | Freq: Every day | ORAL | Status: DC
  Administered 2015-03-27 – 2015-03-28 (×2): 40 mg via ORAL
  Filled 2015-03-27 (×2): qty 1

## 2015-03-27 MED ORDER — PROMETHAZINE HCL 25 MG PO TABS: 12.5000 mg | ORAL_TABLET | ORAL | Status: DC | PRN

## 2015-03-27 MED ORDER — PANTOPRAZOLE SODIUM 40 MG IV SOLR: 40.0000 mg | Freq: Every day | INTRAVENOUS | Status: DC

## 2015-03-27 MED ORDER — HYDROCORTISONE 20 MG PO TABS
50.0000 mg | ORAL_TABLET | Freq: Two times a day (BID) | ORAL | Status: AC
  Administered 2015-03-27 (×2): 50 mg via ORAL
  Filled 2015-03-27 (×2): qty 1

## 2015-03-27 MED ORDER — ONDANSETRON HCL 4 MG/2ML IJ SOLN: 4.0000 mg | INTRAMUSCULAR | Status: DC | PRN

## 2015-03-27 MED ORDER — LABETALOL HCL 5 MG/ML IV SOLN: 10.0000 mg | INTRAVENOUS | Status: DC | PRN

## 2015-03-27 MED ORDER — SODIUM CHLORIDE 0.9 % IV SOLN
INTRAVENOUS | Status: DC
  Administered 2015-03-27: 07:00:00 via INTRAVENOUS
  Administered 2015-03-27: 1000 mL via INTRAVENOUS

## 2015-03-27 MED ORDER — WHITE PETROLATUM GEL
Status: AC
  Administered 2015-03-27: 0.2
  Filled 2015-03-27: qty 1

## 2015-03-27 NOTE — Progress Notes (Signed)
Key Points: Use following P&T approved IV to PO antibiotic change policy.  Description contains the criteria that are approved Note: Policy Excludes:  Esophagectomy patientsPHARMACIST - PHYSICIAN COMMUNICATION DR:   NS CONCERNING: IV to Oral Route Change Policy  RECOMMENDATION: This patient is receiving protonix by the intravenous route.  Based on criteria approved by the Pharmacy and Therapeutics Committee, the intravenous medication(s) is/are being converted to the equivalent oral dose form(s).   DESCRIPTION: These criteria include:  The patient is eating (either orally or via tube) and/or has been taking other orally administered medications for a least 24 hours  The patient has no evidence of active gastrointestinal bleeding or impaired GI absorption (gastrectomy, short bowel, patient on TNA or NPO).  If you have questions about this conversion, please contact the Pharmacy Department  []   [phone] )  [facility] []   [phone] )  [facility] [x]   [phone] )  [facility] []   [phone] )  [facility] []   [phone] )  [place], [employer], [facility] 03/27/2015 11:13 AM

## 2015-03-27 NOTE — Progress Notes (Signed)
Utilization Review Completed.  

## 2015-03-27 NOTE — Anesthesia Postprocedure Evaluation (Signed)
  Anesthesia Post-op Note  Patient: Alyssa Bartlett  Procedure(s) Performed: Procedure(s): CRANIOTOMY HYPOPHYSECTOMY TRANSNASAL APPROACH (N/A) ENDOSCOPIC SINUS SURGERY WITH NAVIGATION (N/A)  Patient Location: PACU  Anesthesia Type: General   Level of Consciousness: awake, alert  and oriented  Airway and Oxygen Therapy: Patient Spontanous Breathing  Post-op Pain: mild  Post-op Assessment: Post-op Vital signs reviewed  Post-op Vital Signs: Reviewed  Last Vitals:  Filed Vitals:   03/27/15 0500  BP: 126/81  Pulse: 84  Temp:   Resp: 11    Complications: No apparent anesthesia complications

## 2015-03-27 NOTE — Progress Notes (Signed)
Pt seen and examined. No issues overnight. Pt reports subjective improvement in vision postop. No excessive thirst. Has left-sided HA  EXAM: Temp:  [96.4 F (35.8 C)-99.1 F (37.3 C)] 99.1 F (37.3 C) (10/08 0800) Pulse Rate:  [60-108] 82 (10/08 0900) Resp:  [9-24] 11 (10/08 0900) BP: (122-159)/(67-96) 131/69 mmHg (10/08 0900) SpO2:  [94 %-100 %] 96 % (10/08 0900) Arterial Line BP: (104-163)/(80-126) 107/90 mmHg (10/08 0300) Weight:  [63.957 kg (141 lb)] 63.957 kg (141 lb) (10/07 1655) Intake/Output      10/07 0701 - 10/08 0700 10/08 0701 - 10/09 0700   P.O.  60   I.V. (mL/kg) 2575 (40.3) 150 (2.3)   IV Piggyback 50    Total Intake(mL/kg) 2625 (41) 210 (3.3)   Urine (mL/kg/hr) 1210 825 (3.3)   Total Output 1210 825   Net +1415 -615         Awake, alert, oriented Speech fluent CN intact Good strength throughout  LABS: Lab Results  Component Value Date   CREATININE 0.56 03/27/2015   BUN <5* 03/27/2015   NA 140 03/27/2015   K 3.9 03/27/2015   CL 107 03/27/2015   CO2 24 03/27/2015   Lab Results  Component Value Date   WBC 6.4 03/26/2015   HGB 15.0 03/26/2015   HCT 44.6 03/26/2015   MCV 89.6 03/26/2015   PLT 243 03/26/2015   8a Cortisol: 53.6  IMPRESSION: - 44 y.o. female POD#1 s/p transsphenoidal resection of tumor for apoplexy. At baseline, no evidence of endocrine dysfunction, subjective improvement in vision  PLAN: - MRI pituitary tomorrow noon - Can d/c foley later today - Mobilize OOB as tolerated - Will recheck Na tomorrow - Stop exogenous steroids after tonight, will recheck am cortisol again tomorrow.

## 2015-03-28 ENCOUNTER — Inpatient Hospital Stay (HOSPITAL_COMMUNITY): Payer: 59

## 2015-03-28 LAB — CORTISOL-AM, BLOOD: Cortisol - AM: 16.7 ug/dL (ref 6.7–22.6)

## 2015-03-28 MED ORDER — ACETAMINOPHEN 325 MG PO TABS: 325.0000 mg | ORAL_TABLET | ORAL | Status: DC | PRN

## 2015-03-28 MED ORDER — GADOBENATE DIMEGLUMINE 529 MG/ML IV SOLN
20.0000 mL | Freq: Once | INTRAVENOUS | Status: AC | PRN
  Administered 2015-03-28: 20 mL via INTRAVENOUS

## 2015-03-28 MED ORDER — ACETAMINOPHEN 500 MG PO TABS
500.0000 mg | ORAL_TABLET | ORAL | Status: DC | PRN
  Administered 2015-03-28 – 2015-03-29 (×4): 500 mg via ORAL
  Filled 2015-03-28 (×4): qty 1

## 2015-03-28 NOTE — Progress Notes (Signed)
Pt seen and examined this am. No issues overnight. Pt has subjective improvement in vision. Mild HA.  EXAM: Temp:  [98.2 F (36.8 C)-99.2 F (37.3 C)] 99.2 F (37.3 C) (10/09 2014) Pulse Rate:  [56-88] 83 (10/09 2014) Resp:  [10-19] 16 (10/09 2014) BP: (101-151)/(61-79) 151/75 mmHg (10/09 2014) SpO2:  [92 %-98 %] 97 % (10/09 2014) Weight:  [63.4 kg (139 lb 12.4 oz)] 63.4 kg (139 lb 12.4 oz) (10/09 2014) Intake/Output      10/09 0701 - 10/10 0700   P.O. 480   I.V. (mL/kg) 150 (2.4)   IV Piggyback    Total Intake(mL/kg) 630 (9.9)   Urine (mL/kg/hr) 125 (0.1)   Total Output 125   Net +505       Urine Occurrence 1 x    Awake, alert, oriented Speech fluent CN intact Good strength   LABS: Lab Results  Component Value Date   CREATININE 0.56 03/27/2015   BUN <5* 03/27/2015   NA 140 03/27/2015   K 3.9 03/27/2015   CL 107 03/27/2015   CO2 24 03/27/2015   Lab Results  Component Value Date   WBC 6.4 03/26/2015   HGB 15.0 03/26/2015   HCT 44.6 03/26/2015   MCV 89.6 03/26/2015   PLT 243 03/26/2015   AM cortisol 16.7  IMAGING: MRI reviewed, demonstrates no identifiable tumor, good decompression of optic apparatus.  IMPRESSION: - 44 y.o. female s/p endoscopic transsphenoidal for pituitary apoplexy, doing well  PLAN: - will stop exogenous steroids - transfer to floor - likely home tomorrow

## 2015-03-29 ENCOUNTER — Encounter: Payer: Self-pay | Admitting: Internal Medicine

## 2015-03-29 ENCOUNTER — Encounter: Payer: Self-pay | Admitting: Family Medicine

## 2015-03-29 ENCOUNTER — Encounter (HOSPITAL_COMMUNITY): Payer: Self-pay | Admitting: Neurosurgery

## 2015-03-29 NOTE — Care Management Note (Signed)
Case Management Note  Patient Details  Name: Alyssa Bartlett MRN: [id number] Date of Birth: 1970-07-07  Subjective/Objective:                    Action/Plan: Patient admitted with pituitary apoplexy. Patient underwent Pituitary resection. She is from home with her spouse. Plan is to discharge home today. No current CM needs.  Expected Discharge Date:                  Expected Discharge Plan:  Home/Self Care  In-House Referral:     Discharge planning Services     Post Acute Care Choice:    Choice offered to:     DME Arranged:    DME Agency:     HH Arranged:    [place] Agency:     Status of Service:  Completed, signed off  Medicare Important Message Given:    Date Medicare IM Given:    Medicare IM give by:    Date Additional Medicare IM Given:    Additional Medicare Important Message give by:     If discussed at Lead of Stay Meetings, dates discussed:    Additional Comments:  Pollie Friar, RN 03/29/2015, 10:00 AM

## 2015-03-29 NOTE — Discharge Summary (Signed)
  Physician Discharge Summary  Patient ID: Alyssa Bartlett MRN: [id number] DOB/AGE: August 12, 1970 44 y.o.  Admit date: 03/26/2015 Discharge date: 03/29/2015  Admission Diagnoses: Pituitary apoplexy  Discharge Diagnoses: Same Active Problems:   Pituitary apoplexy [facility])   Discharged Condition: Stable  Hospital Course:  Alyssa Bartlett is a 44 y.o. female admitted with visual loss and MRI demonstrating pituitary apoplexy. She underwent surgery uneventfully, and noted improvement in vision postop. She did not have any identifiable endocrine abnormalities postop, was off exogenous steroids, and was ambulating well, tolerating diet, and pain under control with Tylenol.  Treatments: Surgery - Transsphenoidal hypophysectomy  Discharge Exam: Blood pressure 123/84, pulse 66, temperature 98.4 F (36.9 C), temperature source Oral, resp. rate 16, height 5\' 4"  (1.626 m), weight 63.4 kg (139 lb 12.4 oz), last menstrual period 03/20/2015, SpO2 98 %. Awake, alert, oriented Speech fluent, appropriate CN grossly intact 5/5 BUE/BLE Wound c/d/i  Disposition: 01-Home or Self Care     Medication List    TAKE these medications        LORazepam 0.5 MG tablet  Commonly known as:  ATIVAN  Take 1 tablet (0.5 mg total) by mouth 2 (two) times daily. Only as needed for severe anxiety. Use a little as possible.     REFRESH OP  Place 1 drop into both eyes.     SYNTHROID 150 MCG tablet  Generic drug:  levothyroxine  Take 1 tablet (150 mcg total) by mouth daily before breakfast.     SYNTHROID 125 MCG tablet  Generic drug:  levothyroxine  Take 1 tablet (125 mcg total) by mouth daily before breakfast.     venlafaxine XR 37.5 MG 24 hr capsule  Commonly known as:  EFFEXOR-XR  Take 1 capsule (37.5 mg total) by mouth daily with breakfast.           Follow-up Information    Follow up with Ruby Cola, MD In 1 week.   Specialty:  Otolaryngology   Contact information:   [address] [place] [address] [phone]       Follow up with Consuella Lose, C, MD In 3 weeks.   Specialty:  Neurosurgery   Contact information:   [address] [place] [address] [place] [address] [phone]       Signed: Consuella Lose, Loletha Grayer 03/29/2015, 9:58 AM

## 2015-03-29 NOTE — Progress Notes (Signed)
Pt arrived on unit from 3MW via wheelchair 2015 hrs, A&O, some anxiety noted, pt oriented to room. Does C/O head and nasal pressure, minor, thin red to pink fluid drainage from nose. Will monitor for changes

## 2015-03-29 NOTE — Progress Notes (Signed)
Discharge orders received, Pt for discharge home today. IV d/c'd. D/c instructions and RX given with verbalized understanding. Family at bedside to assist patient with discharge. Staff bought pt downstairs via wheelchair. 03/29/15 1259

## 2015-03-30 ENCOUNTER — Other Ambulatory Visit: Payer: Self-pay | Admitting: Ophthalmology

## 2015-03-30 DIAGNOSIS — H53421 Scotoma of blind spot area, right eye: Secondary | ICD-10-CM

## 2015-04-05 ENCOUNTER — Other Ambulatory Visit: Payer: Self-pay

## 2015-04-05 ENCOUNTER — Telehealth: Payer: Self-pay | Admitting: Internal Medicine

## 2015-04-05 ENCOUNTER — Other Ambulatory Visit: Payer: Self-pay | Admitting: Internal Medicine

## 2015-04-05 DIAGNOSIS — E039 Hypothyroidism, unspecified: Secondary | ICD-10-CM

## 2015-04-05 DIAGNOSIS — D352 Benign neoplasm of pituitary gland: Secondary | ICD-10-CM

## 2015-04-05 NOTE — Telephone Encounter (Signed)
Pt has lab appt today, has recently had her pituitary removed and wants to let you know in case you wanted to do additional labs

## 2015-04-05 NOTE — Telephone Encounter (Signed)
Please read message below and advise.  

## 2015-04-05 NOTE — Telephone Encounter (Signed)
I put them in

## 2015-04-07 ENCOUNTER — Encounter (INDEPENDENT_AMBULATORY_CARE_PROVIDER_SITE_OTHER): Payer: Self-pay | Admitting: Ophthalmology

## 2015-04-09 ENCOUNTER — Other Ambulatory Visit (INDEPENDENT_AMBULATORY_CARE_PROVIDER_SITE_OTHER): Payer: 59

## 2015-04-09 DIAGNOSIS — E039 Hypothyroidism, unspecified: Secondary | ICD-10-CM

## 2015-04-09 DIAGNOSIS — D352 Benign neoplasm of pituitary gland: Secondary | ICD-10-CM

## 2015-04-09 LAB — TSH: TSH: 0.15 u[IU]/mL — ABNORMAL LOW (ref 0.35–4.50)

## 2015-04-09 LAB — BASIC METABOLIC PANEL
BUN: 14 mg/dL (ref 6–23)
CHLORIDE: 104 meq/L (ref 96–112)
CO2: 27 mEq/L (ref 19–32)
Calcium: 10.8 mg/dL — ABNORMAL HIGH (ref 8.4–10.5)
Creatinine, Ser: 0.63 mg/dL (ref 0.40–1.20)
GFR: 108.87 mL/min (ref >60.00)
GLUCOSE: 91 mg/dL (ref 70–99)
POTASSIUM: 4.4 meq/L (ref 3.5–5.1)
SODIUM: 141 meq/L (ref 135–145)

## 2015-04-09 LAB — T3, FREE: T3, Free: 3.4 pg/mL (ref 2.3–4.2)

## 2015-04-09 LAB — T4, FREE: FREE T4: 1.77 ng/dL — AB (ref 0.60–1.60)

## 2015-04-09 LAB — CORTISOL: Cortisol, Plasma: 11.5 ug/dL

## 2015-04-10 LAB — PROLACTIN: Prolactin: 5.3 ng/mL

## 2015-04-12 ENCOUNTER — Other Ambulatory Visit: Payer: Self-pay | Admitting: Internal Medicine

## 2015-04-12 DIAGNOSIS — E039 Hypothyroidism, unspecified: Secondary | ICD-10-CM | POA: Insufficient documentation

## 2015-04-12 MED ORDER — SYNTHROID 112 MCG PO TABS: 112.0000 ug | ORAL_TABLET | Freq: Every day | ORAL | Status: DC

## 2015-04-14 LAB — ACTH: C206 ACTH: 21 pg/mL (ref 6–50)

## 2015-04-16 ENCOUNTER — Ambulatory Visit: Payer: Self-pay | Admitting: Psychology

## 2015-04-19 DIAGNOSIS — D352 Benign neoplasm of pituitary gland: Secondary | ICD-10-CM | POA: Insufficient documentation

## 2015-04-21 ENCOUNTER — Ambulatory Visit (INDEPENDENT_AMBULATORY_CARE_PROVIDER_SITE_OTHER): Payer: 59 | Admitting: Psychology

## 2015-04-21 DIAGNOSIS — F410 Panic disorder [episodic paroxysmal anxiety] without agoraphobia: Secondary | ICD-10-CM

## 2015-05-04 ENCOUNTER — Encounter: Payer: Self-pay | Admitting: Internal Medicine

## 2015-05-04 ENCOUNTER — Ambulatory Visit (INDEPENDENT_AMBULATORY_CARE_PROVIDER_SITE_OTHER): Payer: 59 | Admitting: Internal Medicine

## 2015-05-04 VITALS — BP 114/70 | HR 94 | Temp 98.4°F | Resp 12 | Wt 146.8 lb

## 2015-05-04 DIAGNOSIS — E236 Other disorders of pituitary gland: Secondary | ICD-10-CM

## 2015-05-04 DIAGNOSIS — E039 Hypothyroidism, unspecified: Secondary | ICD-10-CM | POA: Diagnosis not present

## 2015-05-04 NOTE — Patient Instructions (Signed)
Please come back for labs at 8 am, fasting, at our [place] site.  Please come back for a follow-up appointment in 6 months.

## 2015-05-04 NOTE — Progress Notes (Signed)
Patient ID: Alyssa Bartlett, female   DOB: Jul 09, 1970, 44 y.o.   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 Last visit 4 mo ago.  Hypothyroidism: Alyssa Bartlett. has been dx with hypothyroidism in 2011; started Synthroid, then Levothyroxine >> did not feel good (aches, pains, fatigue) she was seeing [place] providers and also saw endocrinology before; is on NatureThroid 48.7 mg bid (~160 mcg LT4 daily) >> feels better, weight normalized >> however, recently, increased anxiety in last 4 mo - after an allergic rxn (or Niacin flush - was taking a B complex at that time), worse fatigue, insomnia.  We changed from NT to Synthroid 112 mcg daily at last visit. Flushing is better.  She takes the LT4: - fasting in am - with water - separated by 1h from b'fast  - no calcium, iron, PPIs, multivitamins   I reviewed Alyssa Bartlett's thyroid tests: Lab Results  Component Value Date   TSH 0.15* 04/09/2015   TSH 0.12* 02/18/2015   TSH 1.03 01/01/2015   FREET4 1.77* 04/09/2015   FREET4 2.06* 02/18/2015   FREET4 0.90 01/01/2015  09/17/2014: 0.779, TT4 6.3 (4.5-12)  Alyssa Bartlett describes: - + flushing - upper part of the body - better - + anxiety and panic attacks >> She is seeing Irven Shelling (CBT). - +palpitations - + tremors - + fatigue - no constipation - + dry skin - no hair loss - + menses - restarted  Alyssa Bartlett denies feeling nodules in neck, no hoarseness, no dysphagia/odynophagia, SOB with lying down.  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 Blood products are present in the pituitary compatible with pituitary apoplexy. The gland measures 26 mm cephalo caudad dimension. It  measures 19 x 19 mm in cross-sectional dimension. This displaces the optic chiasm superiorly. There is peripheral enhancement about the gland.  - s/p TSR b/c of visual loss (Dr. Kathyrn Sheriff) - MRI (03/28/2015): Sequelae of recent transsphenoidal pituitary resection are identified. Surgical packing material is present in the posterior nasal cavity. The surgical cavity in the sella/ suprasellar cistern measures approximately 19 x 16 mm and demonstrates uniform peripheral enhancement of 3-4 mm in thickness, with anterior aspect open to the nasal cavity. The pituitary infundibulum is displaced superiorly and slightly rightward by these postoperative changes. No discrete nodular enhancement/residual mass is identified. There is no mass effect on the optic chiasm. There may be mild displacement of the prechiasmatic optic nerves, left more so than right.  - will have another one in few weeks, but  >> subjective improvement in vision after surgery  Pit labs normal: Component     Latest Ref Rng 03/28/2015 04/09/2015  Cortisol - AM     6.7 - 22.6 ug/dL 16.7   TSH     0.35 - 4.50 uIU/mL  0.15 (L)  Free T4     0.60 - 1.60 ng/dL  1.77 (H)  T3, Free     2.3 - 4.2 pg/mL  3.4  Cortisol, Plasma       11.5  C206 ACTH     6 - 50 pg/mL  21  Prolactin       5.3  No increased thirst or urination.  -  will have repeat MRI in 6 mo.  I reviewed her chart and she also has a history of prediabetes, mild HL.  ROS: Constitutional: see HPI Eyes: no blurry vision, no xerophthalmia ENT: no sore throat, no nodules palpated in throat, no dysphagia/odynophagia, + hoarseness Cardiovascular: no CP/no SOB/+ palpitations/no leg swelling Respiratory: no cough/SOB Gastrointestinal: + N/no V/D/C Musculoskeletal: no muscle/joint aches Skin: no rashes Neurological: + tremors/no numbness/tingling/dizziness, no HA  I reviewed Alyssa Bartlett's medications, allergies, PMH, social hx, family hx, and changes were documented in  the history of present illness. Otherwise, unchanged from my initial visit note.  Past Medical History  Diagnosis Date  . Hypothyroidism     managed by [facility]  . Mood disorder (HCC)     GAD, panic, depression  . History of chicken pox   . Allergic reaction to food     ? shellfish  . Prediabetes   . Mild hyperlipidemia   . Tobacco use    Past Surgical History  Procedure Laterality Date  . Craniotomy N/A 03/26/2015    Procedure: CRANIOTOMY HYPOPHYSECTOMY TRANSNASAL APPROACH;  Surgeon: Consuella Lose, MD;  Location: [place] [facility];  Service: Neurosurgery;  Laterality: N/A;  . Sinus endo w/fusion N/A 03/26/2015    Procedure: ENDOSCOPIC SINUS SURGERY WITH NAVIGATION;  Surgeon: Ruby Cola, MD;  Location: [facility];  Service: ENT;  Laterality: N/A;   History   Social History  . Marital Status: Single    Spouse Name: N/A  . Number of Children: 0   Occupational History  . intake speacialists    Social History Main Topics  . Smoking status: Former Smoker -- 1.00 packs/day for 14 years    Types: Cigarettes    Start date: 07/20/2008  . Smokeless tobacco: Not on file  . Alcohol Use: Yes     Comment: wine 2-3 drinks 4-5x a week  . 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 LORazepam (ATIVAN) 0.5 MG tablet Take 1 tablet (0.5 mg total) by mouth 2 (two) times daily. Only as needed for severe anxiety. Use a little as possible. 15 tablet 0  . Polyvinyl Alcohol-Povidone (REFRESH OP) Place 1 drop into both eyes.    Marland Kitchen SYNTHROID 112 MCG tablet Take 1 tablet (112 mcg total) by mouth daily before breakfast. 45 tablet 1    No current facility-administered medications on file prior to visit.   Allergies  Allergen Reactions  . Shrimp [Shellfish Allergy]     unknown   Family History  Problem Relation Age of Onset  . Allergic rhinitis Mother   . Allergic rhinitis Father   . Prostate cancer Father   . Hyperlipidemia Mother   . Hyperlipidemia Father   HTN in M and F  PE: BP 114/70 mmHg  Pulse 94  Temp(Src) 98.4 F (36.9 C) (Oral)  Resp 12  Wt 146 lb 12.8 oz (66.588 kg)  SpO2 97% Wt Readings from Last 3 Encounters:  05/04/15 146 lb 12.8 oz (66.588 kg)  03/28/15 139 lb 12.4 oz (63.4 kg)  03/12/15 141 lb 4.8 oz (  64.093 kg)   Constitutional: normal weight, in NAD Eyes: PERRLA, EOMI, no exophthalmos ENT: moist mucous membranes, no thyromegaly,, no cervical lymphadenopathy Cardiovascular: RRR, No MRG Respiratory: CTA B Gastrointestinal: abdomen soft, NT, ND, BS+ Musculoskeletal: no deformities, strength intact in all 4 Skin: moist, warm, no rashes non- Neurological: + mild tremor with outstretched hands, DTR normal in all 4  ASSESSMENT: 1. Hypothyroidism  01/01/2015: Thyroid U/S:   Right thyroid lobe Measurements: 3.3 cm x 0.8 cm x 0.9 cm. Small hypoechoic nodule at the inferior right thyroid measures approximately 3 mm. Heterogeneous appearance of the thyroid tissue  Left thyroid lobe  Measurements: 3.1 cm x 0.6 cm x 0.8 cm. No nodules. Heterogeneous appearance of the thyroid tissue.  Isthmus  Thickness: 3 mm. No nodules visualized.  Lymphadenopathy  None visualized.  IMPRESSION: Relatively unremarkable appearance of the thyroid with single small benign-appearing nodule on the right. No left-sided nodules.  2. Pituitary apoplexy  PLAN:  1. Patient with long-standing hypothyroidism, previously on NatureThroid therapy, now on Synthroid d.a.w., with decreasing dose a month ago. She appears euthyroid, except she has mild tremor with outstretched hands. She does not appear to  have a goiter, thyroid nodules, or neck compression symptoms. - We discussed about correct intake of levothyroxine, fasting, with water, separated by at least 30 minutes from breakfast, and separated by more than 4 hours from calcium, iron, multivitamins, acid reflux medications (PPIs). She is taking it correctly - will check thyroid tests in 4 weeks: TSH, free T4- I am not sure if we can use TSH after her pituitary surgery, since she could have developed central hypothyroidism. - If these are abnormal, she will need to return in 6-8 weeks for repeat labs - If these are normal, I will see her back in 4 months  2. 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 The surgery was performed because of visual loss in right eye. Her visual capacity improved after the surgery. - We reviewed her pituitary hormones together, and they were all normal, except for TSH, which was decreased, unknown if from central hypothyroidism versus over replacement with Synthroid. I suspected the latter, and therefore we decreased the dose of Synthroid to 112 g.  - Will check a TSH and free T4, along with the rest of the pituitary labs in 4 weeks from now, to ensure stability. No signs of DI postop. - She will have another visual field in few weeks  - She will have another MRI in 6 months, which will be arranged by her neurosurgeon, Dr. Kathyrn Sheriff - I will see the patient back in 4 months.   - time spent with the patient: 40 minutes, of which >50% was spent in obtaining information about her new condition (pituitary apoplexy), reviewing her previous labs, evaluations, and treatments, counseling her about her condition, and developing a plan to further investigate it; she had a number of questions which I addressed.

## 2015-05-07 ENCOUNTER — Other Ambulatory Visit: Payer: Self-pay | Admitting: Family Medicine

## 2015-05-07 DIAGNOSIS — F410 Panic disorder [episodic paroxysmal anxiety] without agoraphobia: Secondary | ICD-10-CM

## 2015-05-07 MED ORDER — LORAZEPAM 0.5 MG PO TABS: 0.5000 mg | ORAL_TABLET | Freq: Two times a day (BID) | ORAL | Status: DC

## 2015-05-07 NOTE — Telephone Encounter (Signed)
Rx done and the pt was informed via Mychart message. 

## 2015-05-19 ENCOUNTER — Ambulatory Visit (INDEPENDENT_AMBULATORY_CARE_PROVIDER_SITE_OTHER): Payer: 59 | Admitting: Psychology

## 2015-05-19 DIAGNOSIS — F410 Panic disorder [episodic paroxysmal anxiety] without agoraphobia: Secondary | ICD-10-CM | POA: Diagnosis not present

## 2015-05-31 ENCOUNTER — Ambulatory Visit (INDEPENDENT_AMBULATORY_CARE_PROVIDER_SITE_OTHER): Payer: 59 | Admitting: Psychology

## 2015-05-31 DIAGNOSIS — F410 Panic disorder [episodic paroxysmal anxiety] without agoraphobia: Secondary | ICD-10-CM | POA: Diagnosis not present

## 2015-06-05 ENCOUNTER — Ambulatory Visit (INDEPENDENT_AMBULATORY_CARE_PROVIDER_SITE_OTHER): Payer: 59 | Admitting: Family Medicine

## 2015-06-05 ENCOUNTER — Encounter: Payer: Self-pay | Admitting: Family Medicine

## 2015-06-05 VITALS — BP 120/76 | HR 96 | Temp 99.2°F | Wt 146.0 lb

## 2015-06-05 DIAGNOSIS — R1900 Intra-abdominal and pelvic swelling, mass and lump, unspecified site: Secondary | ICD-10-CM | POA: Insufficient documentation

## 2015-06-05 NOTE — Patient Instructions (Signed)
I do not believe this is an infection. I feel this is either secondary to trauma or a lipoma/cyst. We will order an imaging study for you and that will be set up through your PCP office.

## 2015-06-05 NOTE — Progress Notes (Signed)
Subjective:    Patient ID: Alyssa Bartlett, female    DOB: 24-Dec-1970, 44 y.o.   MRN: [id number]  HPI   Right rib mass: Pt present to Saturday clinic with complaints of a 1 day h/o of swelling below her right breat above her ribs. She denies any pain, redness or trauma to the area. She states she just noticed it was swollen and became concerned because she thinks it feels like there is a mass. She recently had a pituitary tumor removed and worries it is a mass associate with that or her liver.  She states she did have a mild fever today, but also woke with a mild sore throat.   Past Medical History  Diagnosis Date  . Hypothyroidism     managed by [facility]  . Mood disorder (HCC)     GAD, panic, depression  . History of chicken pox   . Allergic reaction to food     ? shellfish  . Prediabetes   . Mild hyperlipidemia   . Tobacco use    Allergies  Allergen Reactions  . Shrimp [Shellfish Allergy]     unknown   Family History  Problem Relation Age of Onset  . Allergic rhinitis Mother   . Allergic rhinitis Father   . Prostate cancer Father   . Hyperlipidemia Mother   . Hyperlipidemia Father    Social History   Social History  . Marital Status: Single    Spouse Name: N/A  . Number of Children: N/A  . 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 Smoking status: Former Smoker -- 1.00 packs/day for 14 years    Types: Cigarettes    Start date: 07/20/2008  . Smokeless tobacco: Not on file  . Alcohol Use: Yes     Comment: wine  . Drug Use: No  . Sexual Activity: Not on file   Other Topics Concern  . 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 No acute distress. Nontoxic in appearance. Well developed, well nourished, female.   HENT: AT. [place].  MMM, no oral lesions. Eyes:Pupils Equal Round Reactive to light, Extraocular movements intact,  Conjunctiva without redness, discharge or icterus. CV: RRR  Abd: Soft. flat. NTND. BS present. Discrete increase in tissue texture/girth superficial to rt rib cage inferior to breast above inferior portion of rib cage. No rebound or guarding.  Skin: No rashes, purpura or petechiae. No erythema.    Assessment & Plan:  1. Mass of abdomen (superficial) - Pt did have a mild fever, with mild upper resp symptoms (did not feel it associated).  - Pt with new Lipoma/fat pad/cyst?. She is rather concerned about the area and is completely sure this is a new manifestion. She has had a recent removal of pituitary tumor and fears it has something to do with it  and is affecting her liver. - reassured pt this did not appear to be much of concern. There is increased girth at the location she is referring and maybe a small cyst or lipoma formation. No distinct border could be palpated. It was not tender or infected appearing. It was superficial to the right rib, inferior to breast.  - discussed with pt will order RUQ [place] to be cautious, with attention to superficial area.  - this can be set up by her PCP office.  - US Abdomen Limited RUQ; Future - pt was in agreement of plan.

## 2015-06-09 ENCOUNTER — Telehealth: Payer: Self-pay | Admitting: Family Medicine

## 2015-06-09 ENCOUNTER — Ambulatory Visit
Admission: RE | Admit: 2015-06-09 | Discharge: 2015-06-09 | Disposition: A | Discharge status: Home / Self Care | Payer: 59 | Source: Ambulatory Visit | Attending: Family Medicine | Admitting: Family Medicine

## 2015-06-09 DIAGNOSIS — R1900 Intra-abdominal and pelvic swelling, mass and lump, unspecified site: Secondary | ICD-10-CM

## 2015-06-09 NOTE — Telephone Encounter (Signed)
Reviewed [place] results with patient. Patient will follow up with PCP for further evaluation.

## 2015-06-09 NOTE — Telephone Encounter (Signed)
Please call pt:  - she was seen in Saturday clinic by this provider, and was scheduled for [place] RUQ. Her [place] results are normal. Normal liver and no cyst etc in the area of concern.

## 2015-06-15 ENCOUNTER — Other Ambulatory Visit: Payer: Self-pay | Admitting: Family Medicine

## 2015-06-15 DIAGNOSIS — F410 Panic disorder [episodic paroxysmal anxiety] without agoraphobia: Secondary | ICD-10-CM

## 2015-06-16 MED ORDER — LORAZEPAM 0.5 MG PO TABS: 0.5000 mg | ORAL_TABLET | Freq: Two times a day (BID) | ORAL | Status: DC

## 2015-06-16 NOTE — Telephone Encounter (Signed)
Rx done and the pt was informed via Mychart message. 

## 2015-06-16 NOTE — Addendum Note (Signed)
Addended by: Agnes Lawrence on: 06/16/2015 01:55 PM   Modules accepted: Orders

## 2015-06-16 NOTE — Telephone Encounter (Signed)
Ok to rx # 15. No refills. Needs OV for further refills.

## 2015-06-18 ENCOUNTER — Other Ambulatory Visit: Payer: Self-pay

## 2015-06-24 ENCOUNTER — Telehealth: Payer: Self-pay | Admitting: Internal Medicine

## 2015-06-24 ENCOUNTER — Ambulatory Visit: Payer: 59 | Admitting: Psychology

## 2015-06-24 NOTE — Telephone Encounter (Signed)
Please read message below. Pt is concerned about any medications contraindicating with her regular medications. Please advise.

## 2015-06-24 NOTE — Telephone Encounter (Signed)
Called pt and lvm advising her per Dr Gherghe's message below.  

## 2015-06-24 NOTE — Telephone Encounter (Signed)
Patient called stating that she currently has a head cold and would like to know what she can take for relief    Please advise

## 2015-06-24 NOTE — Telephone Encounter (Signed)
I am not aware of any potential problems with decongestants. She can use the one of her choice or check with PCP to see what they suggest.

## 2015-07-06 ENCOUNTER — Other Ambulatory Visit: Payer: Self-pay | Admitting: Internal Medicine

## 2015-07-09 ENCOUNTER — Ambulatory Visit (INDEPENDENT_AMBULATORY_CARE_PROVIDER_SITE_OTHER): Payer: 59 | Admitting: Psychology

## 2015-07-09 DIAGNOSIS — F410 Panic disorder [episodic paroxysmal anxiety] without agoraphobia: Secondary | ICD-10-CM | POA: Diagnosis not present

## 2015-07-26 ENCOUNTER — Ambulatory Visit (INDEPENDENT_AMBULATORY_CARE_PROVIDER_SITE_OTHER): Payer: 59 | Admitting: Psychology

## 2015-07-26 DIAGNOSIS — F410 Panic disorder [episodic paroxysmal anxiety] without agoraphobia: Secondary | ICD-10-CM | POA: Diagnosis not present

## 2015-08-12 ENCOUNTER — Ambulatory Visit (INDEPENDENT_AMBULATORY_CARE_PROVIDER_SITE_OTHER): Payer: 59 | Admitting: Family Medicine

## 2015-08-12 ENCOUNTER — Encounter: Payer: Self-pay | Admitting: Family Medicine

## 2015-08-12 VITALS — BP 100/74 | HR 88 | Temp 98.9°F | Ht 64.0 in | Wt 144.6 lb

## 2015-08-12 DIAGNOSIS — E039 Hypothyroidism, unspecified: Secondary | ICD-10-CM

## 2015-08-12 DIAGNOSIS — R1900 Intra-abdominal and pelvic swelling, mass and lump, unspecified site: Secondary | ICD-10-CM

## 2015-08-12 DIAGNOSIS — F410 Panic disorder [episodic paroxysmal anxiety] without agoraphobia: Secondary | ICD-10-CM | POA: Diagnosis not present

## 2015-08-12 DIAGNOSIS — F3341 Major depressive disorder, recurrent, in partial remission: Secondary | ICD-10-CM | POA: Diagnosis not present

## 2015-08-12 DIAGNOSIS — K5900 Constipation, unspecified: Secondary | ICD-10-CM

## 2015-08-12 DIAGNOSIS — F411 Generalized anxiety disorder: Secondary | ICD-10-CM

## 2015-08-12 DIAGNOSIS — E236 Other disorders of pituitary gland: Secondary | ICD-10-CM

## 2015-08-12 MED ORDER — LORAZEPAM 0.5 MG PO TABS: 0.5000 mg | ORAL_TABLET | Freq: Two times a day (BID) | ORAL | Status: DC

## 2015-08-12 NOTE — Progress Notes (Signed)
HPI:  Alyssa Bartlett is a pleasant 45 year old with a past medical history significant for hypothyroidism, pituitary apoplexy status post TSR, generalized anxiety disorder, depression, prediabetes and hyperlipidemia here for a follow-up visit. I have not seen her in some time as she has been followed by her endocrinologist and neurosurgeon for her thyroid and pituitary issues. Her last lipid panel and hemoglobin A1c were normal in September 2016. She reports he has a number of concerns to address today:  GAD with panic disorder, major depressive disorder mild: -Chronic, on numerous medications in the past including Wellbutrin which she felt worked the best for her anxiety symptoms, Prozac, and Effexor which she did not like due to side effects -Currently uses Ativan on occasion for panic attacks Symptoms: Generalized worry, cognitive fog occasionally, panic attacks less frequent recently, feels overwhelmed at work, anhedonia, fatigue -She does not like her job, and reports that many of her colleagues feel the same, she feels workloads are unreasonable. She does not work overtime and would be allowed to get away from her desk at lunch. She spoke with her boss and from what she reports, it seems that her boss was receptive and is planning to work to help try to set up to assist situation. She is looking for a new job. She feels exhausted when she gets home. She really enjoyed her time away from work after her pituitary surgery reports she would just rather be at home. -Reports a supportive home environment -Denies suicidal ideation, thoughts of self-harm, psychotic thoughts or behaviors, manic symptoms or a regular depressed mood -She is seen Dr. Glennon Hamilton currently for CBT, has seen a psychiatrist in the past  Constipation: -Chronic intermittent symptoms, worse the last 3 weeks -Symptoms: Occasionally goes 2 days without a bowel movement, occasionally has hard stools with straining and occasional tear from  straining and large hard stool -Denies: Melena, hematochezia, fevers, diarrhea, unexplained weight loss, abdominal pain -She sees gynecology for her pelvic and Pap  Mass of the abdomen: -Has noticed this for several months -Soft asymptomatic mass in the right upper quadrant, unchanged -Reports evaluation at length our urgent care clinic ultrasound that was normal      ROS: see pertinent positives and negatives per HPI.  Past Medical History  Diagnosis Date  . Hypothyroidism     managed by [facility]  . Mood disorder (HCC)     GAD, panic, depression  . History of chicken pox   . Allergic reaction to food     ? shellfish  . Prediabetes   . Mild hyperlipidemia   . Tobacco use     Past Surgical History  Procedure Laterality Date  . Craniotomy N/A 03/26/2015    Procedure: CRANIOTOMY HYPOPHYSECTOMY TRANSNASAL APPROACH;  Surgeon: Consuella Lose, MD;  Location: [place] [facility];  Service: Neurosurgery;  Laterality: N/A;  . Sinus endo w/fusion N/A 03/26/2015    Procedure: ENDOSCOPIC SINUS SURGERY WITH NAVIGATION;  Surgeon: Ruby Cola, MD;  Location: [facility];  Service: ENT;  Laterality: N/A;    Family History  Problem Relation Age of Onset  . Allergic rhinitis Mother   . Allergic rhinitis Father   . Prostate cancer Father   . Hyperlipidemia Mother   . Hyperlipidemia Father     Social History   Social History  . Marital Status: Single    Spouse Name: N/A  . Number of Children: N/A  . Years of Education: N/A   Occupational History  . intake speacialists    Social  History Main Topics  . Smoking status: Former Smoker -- 1.00 packs/day for 14 years    Types: Cigarettes    Start date: 07/20/2008  . Smokeless tobacco: None  . Alcohol Use: Yes     Comment: wine  . Drug Use: No  . Sexual Activity: Not Asked   Other Topics Concern  . 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  LORazepam (ATIVAN) 0.5 MG tablet, Take 1 tablet (0.5 mg total) by mouth 2 (two) times daily. Only as needed for severe anxiety. Use a little as possible., Disp: 15 tablet, Rfl: 0 .  Polyvinyl Alcohol-Povidone (REFRESH OP), Place 1 drop into both eyes., Disp: , Rfl:  .  sodium chloride (OCEAN) 0.65 % SOLN nasal spray, Place 2 sprays into both nostrils 4 (four) times daily., Disp: , Rfl:  .  SYNTHROID 112 MCG tablet, TAKE 1 TABLET(112 MCG) BY MOUTH DAILY BEFORE BREAKFAST, Disp: 45 tablet, Rfl: 2  EXAM:  Filed Vitals:   08/12/15 1546  BP: 100/74  Pulse: 88  Temp: 98.9 F (37.2 C)    Body mass index is 24.81 kg/(m^2).  GENERAL: vitals reviewed and listed above, alert, oriented, appears well hydrated and in no acute distress  HEENT: atraumatic, conjunttiva clear, no obvious abnormalities on inspection of external nose and ears  NECK: no obvious masses on inspection  LUNGS: clear to auscultation bilaterally, no wheezes, rales or rhonchi, good air movement  CV: HRRR, no peripheral edema  MS: moves all extremities without noticeable abnormality  ABD: slightly more prominent R lower rib cage with some overly adipose tissue, no appreciable discrete mass, no TTP, no rebound or guarding  PSYCH: pleasant and cooperative, anxious demeanor, speech and thought processing grossly intact, no obvious depression  ASSESSMENT AND PLAN:   Greater than 40 minutes spent with patient with greater than 50% of the time spent face-to-face in counseling. Discussed the following assessment and plan:  GAD (generalized anxiety disorder) Major depressive disorder, recurrent episode, in partial remission (HCC) Panic disorder - Plan: LORazepam (ATIVAN) 0.5 MG tablet, DISCONTINUED: LORazepam  (ATIVAN) 0.5 MG tablet -Spent a long time face-to-face with this patient discussing her discontent with her current job. Suggested getting away from her desk during her lunch break for a walk outside as she loves being outdoors, leaving work stress at work and scheduling fun activities on her days off. Congratulated  her on progress towards finding a new job and correcting her current work situation. Advised continued CBT and consideration of psychiatry help if she feels that her anxiety is interfering with her ability to work. Discussed various medications to help with anxiety. She is anxious about side effects to medications. She oddly reports that Wellbutrin helped her anxiety immensely in the past and she may consider restarting this. I did warn her that typically this medicine works well for depression, however sometimes can worsen anxiety. She requested a refill of her Ativan which was provided today to use for occasional panic attacks.  Constipation, unspecified constipation type -we discussed possible serious and likely etiologies, workup and treatment, treatment risks and return precautions -after this discussion, Jagger opted for  trial of fiber supplement and prn MiraLAX -Follow up in 1 month   Mass of abdomen -I do not appreciate a discrete mass on exam, she has mild asymmetry of the lower rib cage and adipose tissue which is likely normal. Advised concerning the next step would be to do a CT scan or MRI. I do not suspect an abnormal or underlying pathology. She opted to defer further evaluation at this time.  Hypothyroidism, unspecified hypothyroidism type Pituitary apoplexy ([place])  she plans to follow up with her endocrinologist, neurosurgeon and ear nose and throat specialist   -Patient advised to return or notify a doctor immediately if symptoms worsen or persist or new concerns arise.  Patient Instructions  BEFORE YOU LEAVE: -Schedule follow up in 1 month  For the mood  disorder: -Schedule fun activities on the weekend and get a way from your desk at lunchtime for a quick walk if possible -Consider starting another medication such as Prozac or the Wellbutrin which he reported worked well for your symptoms in the past -Continue CPT with Dr. Glennon Hamilton -Ativan was refilled for occasional use for panic attacks  For the constipation: -Metamucil or Citrucel daily and water prior to breakfast -MiraLAX as needed for 3-5 days if you start to become constipated  Please schedule a visit with your gynecologist for a pelvic exam and her Pap smear  Please schedule follow-up with her endocrinologist    Consider vitamin D3 [phone] international units daily     KIM, HANNAH R.

## 2015-08-12 NOTE — Progress Notes (Signed)
Pre visit review using our clinic review tool, if applicable. No additional management support is needed unless otherwise documented below in the visit note. 

## 2015-08-12 NOTE — Patient Instructions (Signed)
BEFORE YOU LEAVE: -Schedule follow up in 1 month  For the mood disorder: -Schedule fun activities on the weekend and get a way from your desk at lunchtime for a quick walk if possible -Consider starting another medication such as Prozac or the Wellbutrin which he reported worked well for your symptoms in the past -Continue CPT with Dr. Glennon Hamilton -Ativan was refilled for occasional use for panic attacks  For the constipation: -Metamucil or Citrucel daily and water prior to breakfast -MiraLAX as needed for 3-5 days if you start to become constipated  Please schedule a visit with your gynecologist for a pelvic exam and her Pap smear  Please schedule follow-up with her endocrinologist    Consider vitamin D3 [phone] international units daily

## 2015-08-18 ENCOUNTER — Ambulatory Visit (INDEPENDENT_AMBULATORY_CARE_PROVIDER_SITE_OTHER): Payer: 59 | Admitting: Psychology

## 2015-08-18 DIAGNOSIS — F410 Panic disorder [episodic paroxysmal anxiety] without agoraphobia: Secondary | ICD-10-CM

## 2015-08-30 ENCOUNTER — Other Ambulatory Visit: Payer: Self-pay | Admitting: Neurosurgery

## 2015-08-30 DIAGNOSIS — D352 Benign neoplasm of pituitary gland: Secondary | ICD-10-CM

## 2015-09-01 ENCOUNTER — Ambulatory Visit (INDEPENDENT_AMBULATORY_CARE_PROVIDER_SITE_OTHER): Payer: 59 | Admitting: Psychology

## 2015-09-01 DIAGNOSIS — F410 Panic disorder [episodic paroxysmal anxiety] without agoraphobia: Secondary | ICD-10-CM | POA: Diagnosis not present

## 2015-09-17 ENCOUNTER — Encounter: Payer: Self-pay | Admitting: Family Medicine

## 2015-09-17 ENCOUNTER — Ambulatory Visit (INDEPENDENT_AMBULATORY_CARE_PROVIDER_SITE_OTHER): Payer: 59 | Admitting: Family Medicine

## 2015-09-17 VITALS — BP 100/80 | HR 87 | Temp 98.1°F | Ht 64.0 in | Wt 146.6 lb

## 2015-09-17 DIAGNOSIS — K5900 Constipation, unspecified: Secondary | ICD-10-CM | POA: Diagnosis not present

## 2015-09-17 DIAGNOSIS — F3341 Major depressive disorder, recurrent, in partial remission: Secondary | ICD-10-CM

## 2015-09-17 DIAGNOSIS — E236 Other disorders of pituitary gland: Secondary | ICD-10-CM

## 2015-09-17 DIAGNOSIS — K649 Unspecified hemorrhoids: Secondary | ICD-10-CM | POA: Diagnosis not present

## 2015-09-17 DIAGNOSIS — F411 Generalized anxiety disorder: Secondary | ICD-10-CM | POA: Diagnosis not present

## 2015-09-17 HISTORY — DX: Unspecified hemorrhoids: K64.9

## 2015-09-17 NOTE — Progress Notes (Signed)
HPI:  Alyssa Bartlett is a pleasant 45 yo here for a 1 month follow up regarding constipation.  Constipation: -see notes from 2/23, reviewed -advised trial daily fiber supplement and prn mirilax along with yearly pelvic/pap -reports:symptoms have completely resolved, she did have a hemorrhoid flare before starting the treatments above  Depression/Anxiety: -general discontent with her job at last visit and stress related to this, reports things have improved significantly, they are in meetings to improve staff and provider satisfaction and reduce burnout -uses rare benzos, I do not usually rx these medications , but have made exception for small amount -she has been on many medications, feels wellbutrin worked the best, now feels does not need a medication since work is improving -sees Dr. Glennon Hamilton  History Pituitary tumor: -s/p resection in 2016, managed by Dr. Kathyrn Sheriff, [place] -reports MRI done and looked good with no repeat for another year  ROS: See pertinent positives and negatives per HPI.  Past Medical History  Diagnosis Date  . Hypothyroidism     managed by [facility]  . Mood disorder (HCC)     GAD, panic, depression  . History of chicken pox   . Allergic reaction to food     ? shellfish  . Prediabetes   . Mild hyperlipidemia   . Tobacco use     Past Surgical History  Procedure Laterality Date  . Craniotomy N/A 03/26/2015    Procedure: CRANIOTOMY HYPOPHYSECTOMY TRANSNASAL APPROACH;  Surgeon: Consuella Lose, MD;  Location: [facility];  Service: Neurosurgery;  Laterality: N/A;  . Sinus endo w/fusion N/A 03/26/2015    Procedure: ENDOSCOPIC SINUS SURGERY WITH NAVIGATION;  Surgeon: Ruby Cola, MD;  Location: [facility];  Service: ENT;  Laterality: N/A;    Family History  Problem Relation Age of Onset  . Allergic rhinitis Mother   . Allergic rhinitis Father   . Prostate cancer Father   . Hyperlipidemia Mother   . Hyperlipidemia Father      Social History   Social History  . Marital Status: Single    Spouse Name: N/A  . Number of Children: N/A  . 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 Smoking status: Former Smoker -- 1.00 packs/day for 14 years    Types: Cigarettes    Start date: 07/20/2008  . Smokeless tobacco: None  . Alcohol Use: Yes     Comment: wine  . Drug Use: No  . Sexual Activity: Not Asked   Other Topics Concern  . 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  LORazepam (ATIVAN) 0.5 MG tablet, Take 1 tablet (0.5 mg total) by mouth 2 (two) times daily. Only as needed for severe anxiety. Use a little as possible., Disp: 15 tablet, Rfl: 0 .  Polyvinyl Alcohol-Povidone (REFRESH OP), Place 1 drop into both eyes., Disp: , Rfl:  .  sodium chloride (OCEAN) 0.65 % SOLN nasal spray, Place 2 sprays into both nostrils 4 (four) times daily., Disp: , Rfl:  .  SYNTHROID 112 MCG tablet, TAKE 1 TABLET(112 MCG) BY MOUTH DAILY  BEFORE BREAKFAST, Disp: 45 tablet, Rfl: 2  EXAM:  Filed Vitals:   09/17/15 1601  BP: 100/80  Pulse: 87  Temp: 98.1 F (36.7 C)    Body mass index is 25.15 kg/(m^2).  GENERAL: vitals reviewed and listed above, alert, oriented, appears well hydrated and in no acute distress  HEENT: atraumatic, conjunttiva clear, no obvious abnormalities on inspection of external nose and ears  NECK: no obvious masses on inspection  LUNGS: clear to auscultation bilaterally, no wheezes, rales or rhonchi, good air movement  CV: HRRR, no peripheral edema  MS: moves all extremities without noticeable abnormality  PSYCH: pleasant and cooperative, no obvious  depression or anxiety  ASSESSMENT AND PLAN:  Discussed the following assessment and plan:  Major depressive disorder, recurrent episode, in partial remission (HCC)  GAD (generalized anxiety disorder)  Constipation, unspecified constipation type  Hemorrhoids, unspecified hemorrhoid type  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  There are no Patient Instructions on file for this visit.   Colin Benton R.

## 2015-09-17 NOTE — Progress Notes (Signed)
Pre visit review using our clinic review tool, if applicable. No additional management support is needed unless otherwise documented below in the visit note. 

## 2015-09-23 ENCOUNTER — Ambulatory Visit (INDEPENDENT_AMBULATORY_CARE_PROVIDER_SITE_OTHER): Payer: 59 | Admitting: Psychology

## 2015-09-23 DIAGNOSIS — F410 Panic disorder [episodic paroxysmal anxiety] without agoraphobia: Secondary | ICD-10-CM | POA: Diagnosis not present

## 2015-10-08 ENCOUNTER — Other Ambulatory Visit: Payer: Self-pay | Admitting: Family Medicine

## 2015-10-08 DIAGNOSIS — F410 Panic disorder [episodic paroxysmal anxiety] without agoraphobia: Secondary | ICD-10-CM

## 2015-10-08 MED ORDER — LORAZEPAM 0.5 MG PO TABS: 0.5000 mg | ORAL_TABLET | Freq: Two times a day (BID) | ORAL | Status: DC

## 2015-10-08 NOTE — Addendum Note (Signed)
Addended by: Lucretia Kern on: 10/08/2015 03:41 PM   Modules accepted: Orders

## 2015-10-08 NOTE — Telephone Encounter (Signed)
Printed Rx was faxed to [facility].

## 2015-10-13 ENCOUNTER — Encounter: Payer: Self-pay | Admitting: Family Medicine

## 2015-10-22 ENCOUNTER — Ambulatory Visit (INDEPENDENT_AMBULATORY_CARE_PROVIDER_SITE_OTHER): Payer: 59 | Admitting: Psychology

## 2015-10-22 DIAGNOSIS — F410 Panic disorder [episodic paroxysmal anxiety] without agoraphobia: Secondary | ICD-10-CM | POA: Diagnosis not present

## 2015-10-28 ENCOUNTER — Other Ambulatory Visit (INDEPENDENT_AMBULATORY_CARE_PROVIDER_SITE_OTHER): Payer: 59

## 2015-10-28 DIAGNOSIS — E236 Other disorders of pituitary gland: Secondary | ICD-10-CM | POA: Diagnosis not present

## 2015-10-28 DIAGNOSIS — E039 Hypothyroidism, unspecified: Secondary | ICD-10-CM | POA: Diagnosis not present

## 2015-10-28 LAB — TSH: TSH: 0.07 u[IU]/mL — AB (ref 0.35–4.50)

## 2015-10-28 LAB — CORTISOL: CORTISOL PLASMA: 15 ug/dL

## 2015-10-28 LAB — LUTEINIZING HORMONE: LH: 2.55 m[IU]/mL

## 2015-10-28 LAB — T4, FREE: FREE T4: 1.16 ng/dL (ref 0.60–1.60)

## 2015-10-28 LAB — FOLLICLE STIMULATING HORMONE: FSH: 4.9 m[IU]/mL

## 2015-10-29 ENCOUNTER — Telehealth: Payer: Self-pay | Admitting: Internal Medicine

## 2015-10-29 LAB — PROLACTIN: Prolactin: 7.1 ng/mL

## 2015-10-29 NOTE — Telephone Encounter (Signed)
Patient is calling for the results of her lab work °

## 2015-10-29 NOTE — Telephone Encounter (Signed)
Called pt and advised her that Dr Cruzita Lederer has not been able to review lab results and will advise her when she is able to review them.

## 2015-11-01 ENCOUNTER — Encounter: Payer: Self-pay | Admitting: Internal Medicine

## 2015-11-01 ENCOUNTER — Ambulatory Visit (INDEPENDENT_AMBULATORY_CARE_PROVIDER_SITE_OTHER): Payer: 59 | Admitting: Internal Medicine

## 2015-11-01 VITALS — BP 112/70 | HR 96 | Temp 98.6°F | Ht 64.0 in | Wt 148.5 lb

## 2015-11-01 DIAGNOSIS — E236 Other disorders of pituitary gland: Secondary | ICD-10-CM | POA: Diagnosis not present

## 2015-11-01 DIAGNOSIS — E038 Other specified hypothyroidism: Secondary | ICD-10-CM | POA: Diagnosis not present

## 2015-11-01 LAB — ACTH: C206 ACTH: 35 pg/mL (ref 6–50)

## 2015-11-01 NOTE — Progress Notes (Signed)
Patient ID: Alyssa Bartlett, female   DOB: 1970-08-14, 45 y.o.   MRN: [id number]   HPI  Alyssa Bartlett is a 45 y.o.-year-old female, returning for f/u for hypothyroidism and pituitary apoplexy (developed since last visit). Last visit 4 mo ago.  Hypothyroidism: Pt. has been dx with hypothyroidism in 2011; started Synthroid, then Levothyroxine >> did not feel good (aches, pains, fatigue) she was seeing [facility] providers and also saw endocrinology before; is on NatureThroid 48.7 mg bid (~160 mcg LT4 daily) >> feels better, weight normalized >> however, recently, increased anxiety in last 4 mo - after an allergic rxn (or Niacin flush - was taking a B complex at that time), worse fatigue, insomnia.  We changed from NT to Synthroid 112 mcg daily last year.  She takes the LT4: - fasting in am - with water - separated by 1h from b'fast  - no calcium, iron, PPIs, multivitamins   I reviewed pt's thyroid tests: Lab Results  Component Value Date   TSH 0.07* 10/28/2015   TSH 0.15* 04/09/2015   TSH 0.12* 02/18/2015   TSH 1.03 01/01/2015   FREET4 1.16 10/28/2015   FREET4 1.77* 04/09/2015   FREET4 2.06* 02/18/2015   FREET4 0.90 01/01/2015  09/17/2014: 0.779, TT4 6.3 (4.5-12)  Pt describes: - + fatigue - + flushing - upper part of the body - better - + anxiety >> She is seeing Irven Shelling (CBT). - Resolved palpitations - Very mild tremors - + constipation - no hair loss - + menses - restarted, heavy  Pt denies feeling nodules in neck, no hoarseness, no dysphagia/odynophagia, SOB with lying down.  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 Blood products are present in the pituitary compatible with pituitary apoplexy. The gland measures 26 mm cephalo  caudad dimension. It measures 19 x 19 mm in cross-sectional dimension. This displaces the optic chiasm superiorly. There is peripheral enhancement about the gland.  - s/p TSR b/c of visual loss (Dr. Kathyrn Sheriff) - MRI (03/28/2015): Sequelae of recent transsphenoidal pituitary resection are identified. Surgical packing material is present in the posterior nasal cavity. The surgical cavity in the sella/ suprasellar cistern measures approximately 19 x 16 mm and demonstrates uniform peripheral enhancement of 3-4 mm in thickness, with anterior aspect open to the nasal cavity. The pituitary infundibulum is displaced superiorly and slightly rightward by these postoperative changes. No discrete nodular enhancement/residual mass is identified. There is no mass effect on the optic chiasm. There may be mild displacement of the prechiasmatic optic nerves, left more so than right.  - She had another visual field test at the end of 2016 >> significant improvement in vision after surgery. 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  Pituitary labs recently normal, except low TSH:  Component     Latest Ref Rng 10/28/2015  TSH     0.35 - 4.50 uIU/mL 0.07 (L)  T4,Free(Direct)     0.60 - 1.60 ng/dL 1.16  Prolactin      7.1  Cortisol, Plasma      15.0  LH      2.55  FSH      4.9   Prev: Component     Latest Ref Rng 03/28/2015 04/09/2015  Cortisol - AM  6.7 - 22.6 ug/dL 16.7   TSH     0.35 - 4.50 uIU/mL  0.15 (L)  Free T4     0.60 - 1.60 ng/dL  1.77 (H)  T3, Free     2.3 - 4.2 pg/mL  3.4  Cortisol, Plasma       11.5  C206 ACTH     6 - 50 pg/mL  21  Prolactin       5.3  No increased thirst or urination.  09/16/2015: MRI pituitary: showed perhaps minimal peripheral residual tumor but otherwise a gross resection, mild sphenoid and right maxillary T2-hyperintense mucous retention cysts, and otherwise nicely patent and well-healed sinusotomies.   I reviewed  her chart and she also has a history of prediabetes, mild HL.  ROS: Constitutional: see HPI Eyes: no blurry vision, no xerophthalmia ENT: no sore throat, no nodules palpated in throat, no dysphagia/odynophagia, hoarseness Cardiovascular: no CP/no SOB/palpitations/leg swelling Respiratory: no cough/SOB Gastrointestinal: no N/V/D/+ C/+ Bloating Musculoskeletal: no muscle/joint aches Skin: no rashes, + flushing in upper part of the body Neurological: + tremors/no numbness/tingling/dizziness, no HA + More painful menses  I reviewed pt's medications, allergies, PMH, social hx, family hx, and changes were documented in the history of present illness. Otherwise, unchanged from my initial visit note.  Past Medical History  Diagnosis Date  . Hypothyroidism    . Mood disorder (HCC)     GAD, panic, depression  . History of chicken pox   . Allergic reaction to food     ? shellfish  . Prediabetes   . Mild hyperlipidemia   . Tobacco use    Past Surgical History  Procedure Laterality Date  . Craniotomy N/A 03/26/2015    Procedure: CRANIOTOMY HYPOPHYSECTOMY TRANSNASAL APPROACH;  Surgeon: Consuella Lose, MD;  Location: [facility];  Service: Neurosurgery;  Laterality: N/A;  . Sinus endo w/fusion N/A 03/26/2015    Procedure: ENDOSCOPIC SINUS SURGERY WITH NAVIGATION;  Surgeon: Ruby Cola, MD;  Location: [facility];  Service: ENT;  Laterality: N/A;   History   Social History  . Marital Status: Single    Spouse Name: N/A  . Number of Children: 0   Occupational History  . intake speacialists    Social History Main Topics  . Smoking status: Former Smoker -- 1.00 packs/day for 14 years    Types: Cigarettes    Start date: 07/20/2008  . Smokeless tobacco: Not on file  . Alcohol Use: Yes     Comment: wine 2-3 drinks 4-5x a week  . 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 LORazepam (ATIVAN) 0.5 MG tablet Take 1 tablet (0.5 mg total) by mouth 2 (two) times daily. Only as needed for severe anxiety. Use a little as possible. 15 tablet 0  . Polyvinyl Alcohol-Povidone (REFRESH OP) Place 1 drop into both eyes.    . sodium chloride (OCEAN) 0.65 % SOLN nasal spray Place 2 sprays into both nostrils 4 (four) times daily.    Marland Kitchen SYNTHROID 112 MCG tablet TAKE 1 TABLET(112 MCG) BY MOUTH DAILY BEFORE  BREAKFAST 45 tablet 2   No current facility-administered medications on file prior to visit.   Allergies  Allergen Reactions  . Shrimp [Shellfish Allergy]     unknown   Family History  Problem Relation Age of Onset  . Allergic rhinitis Mother   . Allergic rhinitis Father   . Prostate cancer Father   . Hyperlipidemia Mother   . Hyperlipidemia Father   HTN in M and F  PE: BP 112/70 mmHg  Pulse 96  Temp(Src) 98.6 F (37 C) (Oral)  Ht 5\' 4"  (1.626 m)  Wt 148 lb 8 oz (67.359 kg)  BMI 25.48 kg/m2  LMP 10/29/2015 Wt Readings from Last 3 Encounters:  11/01/15 148 lb 8 oz (67.359 kg)  09/17/15 146 lb 9.6 oz (66.497 kg)  08/12/15 144 lb 9.6 oz (65.59 kg)   Constitutional: normal weight, in NAD Eyes: PERRLA, EOMI, no exophthalmos ENT: moist mucous membranes, no thyromegaly,, no cervical lymphadenopathy Cardiovascular: RRR, No MRG Respiratory: CTA B Gastrointestinal: abdomen soft, NT, ND, BS+ Musculoskeletal: no deformities, strength intact in all 4 Skin: moist, warm, no rashes non- Neurological: + mild tremor with outstretched hands, DTR normal in all 4  ASSESSMENT: 1. Hypothyroidism  01/01/2015: Thyroid U/S:   Right thyroid lobe Measurements: 3.3 cm x 0.8 cm x 0.9 cm. Small hypoechoic nodule at the inferior right thyroid  measures approximately 3 mm. Heterogeneous appearance of the thyroid tissue  Left thyroid lobe  Measurements: 3.1 cm x 0.6 cm x 0.8 cm. No nodules. Heterogeneous appearance of the thyroid tissue.  Isthmus  Thickness: 3 mm. No nodules visualized.  Lymphadenopathy  None visualized.  IMPRESSION: Relatively unremarkable appearance of the thyroid with single small benign-appearing nodule on the right. No left-sided nodules.  2. Pituitary apoplexy  PLAN:  1. Patient with long-standing hypothyroidism, previously on NatureThroid therapy, now on Synthroid d.a.w. She appears euthyroid, except she has mild tremor with outstretched hands and flushing (long standing). She does complain of fatigue and is contemplating whether she should go back to NatureThroid or not. For now, she decided to remain on Synthroid for now. She does not appear to have a goiter, thyroid nodules, or neck compression symptoms. - Her TSH decreased after her pituitary apoplexy, after previously being normal on the equivalent dose of Synthroid 125 >> 112 g daily. Her free T4 was normal few days ago. Therefore, for now, will continue the same dose of Synthroid. - We discussed about correct intake of levothyroxine, fasting, with water, separated by at least 30 minutes from breakfast, and separated by more than 4 hours from calcium, iron, multivitamins, acid reflux medications (PPIs). She is taking it correctly - I will see her back in 6 months  2. 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 The surgery was performed because of visual loss in right eye. Her visual capacity improved after the surgery. - pituitary hormones were normal postop - We recently checked a TSH and free T4, along with the rest of the pituitary labs. Not all labs are back yet (ACTH, IGF-I, and estradiol levels still pending), but the ones that returned were normal except for TSH, which was decreased, most  likely from central hypothyroidism versus over replacement with Synthroid. No signs of DI postop. - She had a visual field at the end of last year >> much improved - She has pituitary MRI's by her neurosurgeon, Dr. Kathyrn Sheriff - I do not have the actual report of the last MRI, which she had in  08/2015, however, there were no concerns (there was questionable scar tissue versus recurrence in the sella, however, this was felt to be scar tissue). She will have a repeat MRI in a year. - I will see the patient back in 6 months.

## 2015-11-01 NOTE — Patient Instructions (Signed)
Please come back for a follow-up appointment in 6 months.  Please continue Synthroid 112 mcg daily.

## 2015-11-02 LAB — INSULIN-LIKE GROWTH FACTOR
IGF-I, LC/MS: 248 ng/mL (ref 52–328)
Z-Score (Female): 1.3 SD (ref ?–2.0)

## 2015-11-05 LAB — ESTRADIOL, FREE
ESTRADIOL: 47 pg/mL
Estradiol, Free: 0.96 pg/mL

## 2015-11-08 ENCOUNTER — Encounter: Payer: Self-pay | Admitting: Family Medicine

## 2015-11-19 ENCOUNTER — Ambulatory Visit: Payer: 59 | Admitting: Psychology

## 2015-11-23 ENCOUNTER — Other Ambulatory Visit: Payer: Self-pay | Admitting: Internal Medicine

## 2015-12-05 ENCOUNTER — Other Ambulatory Visit: Payer: Self-pay | Admitting: Family Medicine

## 2015-12-05 DIAGNOSIS — F410 Panic disorder [episodic paroxysmal anxiety] without agoraphobia: Secondary | ICD-10-CM

## 2015-12-07 ENCOUNTER — Ambulatory Visit (INDEPENDENT_AMBULATORY_CARE_PROVIDER_SITE_OTHER): Payer: 59 | Admitting: Psychology

## 2015-12-07 DIAGNOSIS — F410 Panic disorder [episodic paroxysmal anxiety] without agoraphobia: Secondary | ICD-10-CM

## 2015-12-07 MED ORDER — LORAZEPAM 0.5 MG PO TABS: 0.5000 mg | ORAL_TABLET | Freq: Two times a day (BID) | ORAL | Status: DC

## 2016-01-06 ENCOUNTER — Ambulatory Visit (INDEPENDENT_AMBULATORY_CARE_PROVIDER_SITE_OTHER): Payer: 59 | Admitting: Psychology

## 2016-01-06 DIAGNOSIS — F410 Panic disorder [episodic paroxysmal anxiety] without agoraphobia: Secondary | ICD-10-CM | POA: Diagnosis not present

## 2016-02-06 ENCOUNTER — Other Ambulatory Visit: Payer: Self-pay | Admitting: Family Medicine

## 2016-02-06 DIAGNOSIS — F410 Panic disorder [episodic paroxysmal anxiety] without agoraphobia: Secondary | ICD-10-CM

## 2016-02-07 ENCOUNTER — Ambulatory Visit (INDEPENDENT_AMBULATORY_CARE_PROVIDER_SITE_OTHER): Payer: 59 | Admitting: Psychology

## 2016-02-07 DIAGNOSIS — F410 Panic disorder [episodic paroxysmal anxiety] without agoraphobia: Secondary | ICD-10-CM

## 2016-02-07 MED ORDER — LORAZEPAM 0.5 MG PO TABS: 0.5000 mg | ORAL_TABLET | Freq: Two times a day (BID) | ORAL | 0 refills | Status: DC

## 2016-02-07 NOTE — Telephone Encounter (Signed)
Printed Rx was faxed to [facility] at [phone].

## 2016-02-15 ENCOUNTER — Encounter: Payer: Self-pay | Admitting: Family Medicine

## 2016-02-15 ENCOUNTER — Ambulatory Visit (INDEPENDENT_AMBULATORY_CARE_PROVIDER_SITE_OTHER): Payer: 59 | Admitting: Family Medicine

## 2016-02-15 VITALS — BP 112/72 | HR 81 | Temp 98.8°F | Ht 64.0 in | Wt 148.8 lb

## 2016-02-15 DIAGNOSIS — F411 Generalized anxiety disorder: Secondary | ICD-10-CM

## 2016-02-15 DIAGNOSIS — E236 Other disorders of pituitary gland: Secondary | ICD-10-CM

## 2016-02-15 DIAGNOSIS — E038 Other specified hypothyroidism: Secondary | ICD-10-CM | POA: Diagnosis not present

## 2016-02-15 DIAGNOSIS — F410 Panic disorder [episodic paroxysmal anxiety] without agoraphobia: Secondary | ICD-10-CM | POA: Diagnosis not present

## 2016-02-15 DIAGNOSIS — F3341 Major depressive disorder, recurrent, in partial remission: Secondary | ICD-10-CM

## 2016-02-15 NOTE — Progress Notes (Signed)
HPI:  Depression/Anxiety: -general discontent with her job in the past, better last visit -uses rare benzos, I do not usually rx these medications , but have made exception for small amount as she has not seen psych and feels in the only thing that helps when has severe anxiety -she has been on many medications, feels wellbutrin worked ok remotely - but symptoms mor anxiety related now; in the past reported intolerance to many meds including effexor and SSRIs -now worsening anxiety and wants to try lexapro that gyn prescribed; wants my thoughts on this, but plans to follow up with gyn -wonders if related to thyroid, but declines labs today -several friend passed and looking for new job - has had increased generalized anxiety, panic, sweaty palms, palpitations, etc -she saw psych and reports they were not helpful and wanted drug testing that insurance would not pay -she is looking for a job change -sees Dr. Glennon Hamilton for CBT  History Pituitary tumor: -s/p resection in 2016, managed by Dr. Kathyrn Sheriff, [facility] and Dr. Cruzita Lederer, endo -reports MRI done and looked good with no repeat for another year  Constipation: -see notes from 2/23, reviewed -advised trial daily fiber supplement and prn mirilax along with yearly pelvic/pap - symptoms resolved  Hypothyroid: -sees Dr. Cruzita Lederer   ROS: See pertinent positives and negatives per HPI.  Past Medical History:  Diagnosis Date  . Allergic reaction to food    ? shellfish  . Hemorrhoid 09/17/2015  . History of chicken pox   . Hypothyroidism    managed by [facility]  . Mild hyperlipidemia   . Mood disorder (HCC)    GAD, panic, depression  . Prediabetes   . Tobacco use     Past Surgical History:  Procedure Laterality Date  . CRANIOTOMY N/A 03/26/2015   Procedure: CRANIOTOMY HYPOPHYSECTOMY TRANSNASAL APPROACH;  Surgeon: Consuella Lose, MD;  Location: [facility];  Service: Neurosurgery;  Laterality: N/A;  . SINUS ENDO  W/FUSION N/A 03/26/2015   Procedure: ENDOSCOPIC SINUS SURGERY WITH NAVIGATION;  Surgeon: Ruby Cola, MD;  Location: [facility];  Service: ENT;  Laterality: N/A;    Family History  Problem Relation Age of Onset  . Allergic rhinitis Mother   . Allergic rhinitis Father   . Prostate cancer Father   . Hyperlipidemia Mother   . Hyperlipidemia Father     Social History   Social History  . Marital status: Single    Spouse name: N/A  . Number of children: N/A  . Years of education: N/A   Occupational History  . intake speacialists    Social History Main Topics  . Smoking status: Former Smoker    Packs/day: 1.00    Years: 14.00    Types: Cigarettes    Start date: 07/20/2008  . Smokeless tobacco: Never Used     Comment: chews Nicotine gum  . Alcohol use 0.0 oz/week     Comment: wine  . Drug use: No  . Sexual activity: Not Asked   Other Topics Concern  . 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  LORazepam (ATIVAN) 0.5 MG tablet, Take 1 tablet (0.5  mg total) by mouth 2 (two) times daily. Only as needed for severe anxiety. Use a little as possible., Disp: 15 tablet, Rfl: 0 .  Polyvinyl Alcohol-Povidone (REFRESH OP), Place 1 drop into both eyes., Disp: , Rfl:  .  sodium chloride (OCEAN) 0.65 % SOLN nasal spray, Place 2 sprays into both nostrils 4 (four) times daily., Disp: , Rfl:  .  SYNTHROID 112 MCG tablet, TAKE 1 TABLET(112 MCG) BY MOUTH DAILY BEFORE BREAKFAST, Disp: 45 tablet, Rfl: 2  EXAM:  Vitals:   02/15/16 1602  BP: 112/72  Pulse: 81  Temp: 98.8 F (37.1 C)    Body mass index is 25.54 kg/m.  GENERAL: vitals reviewed and listed above, alert, oriented, appears well hydrated and in  no acute distress  HEENT: atraumatic, conjunttiva clear, no obvious abnormalities on inspection of external nose and ears  NECK: no obvious masses on inspection  LUNGS: clear to auscultation bilaterally, no wheezes, rales or rhonchi, good air movement  CV: HRRR, no peripheral edema  MS: moves all extremities without noticeable abnormality  PSYCH: pleasant and cooperative, no obvious depression or anxiety  ASSESSMENT AND PLAN:  Discussed the following assessment and plan:  GAD (generalized anxiety disorder) Major depressive disorder, recurrent episode, in partial remission (HCC) Panic disorder -surprised she now wants to try SSRI as she adamantly against it in the past when I suggested it - hope she tolerates it and that it helps; advised psych re-eval if not.  Offered to follow up on this in 4-6 weeks but she prefers to see gyn for this.  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  -Patient advised to return or notify a doctor immediately if symptoms worsen or persist or new concerns arise.  There are no Patient Instructions on file for this visit.  Colin Benton R., DO

## 2016-02-15 NOTE — Progress Notes (Signed)
Pre visit review using our clinic review tool, if applicable. No additional management support is needed unless otherwise documented below in the visit note. 

## 2016-02-23 ENCOUNTER — Telehealth: Payer: Self-pay | Admitting: Family Medicine

## 2016-02-23 NOTE — Progress Notes (Signed)
HPI:  Acute visit for:  Palpitations: -long hx intermittent palpitation - worsened and more frequent the last 3 weeks -report palpitations now are occurring several times per day and feel like "heart skips a beat" - not accompanied by CP, SOB, DOE, syncope -reports a very stressful day at work 2 days ago and when got home had increased anxiety, palpitations, sweating, presyncopal feeling, sweating -> went to an [facility] and they checked her BS but advised she go to ED for labs and EKG (she did not go to ED as did not want to wait so is here today for "EKG and labs") -admits worsening anxiety/work related stress the last few weeks -seeing counselor, hx of poor tolerance to several meds in the past, I had advised SSRI but she had told me she did not tolerate, then recently gyn rxd ssri and she had planned to take but then did not start; I have advised psychiatry evaluation and care a number of times -she takes prn benzo for panic attacks but is anxious about taking this -she is getting counseling -she has finally set up a visit with a psychiatrist and is seeing him today, Dr. Tye Savoy - [place]) -complicated PMH with anxiety, panic disorder, pituitary apolexy, Hypothyroidism, Hot flashes  -she sees Dr. Cruzita Lederer in endocrinology and had a full pituitary panel several months ago - wants to recheck thyroid and prolactin -no fevers, malaise, HA, vision changes, CP, SOB, DOE, exertional symptoms, SI, thoughts of self harm  ROS: See pertinent positives and negatives per HPI.  Past Medical History:  Diagnosis Date  . Allergic reaction to food    ? shellfish  . Hemorrhoid 09/17/2015  . History of chicken pox   . Hypothyroidism    managed by [facility]  . Mild hyperlipidemia   . Mood disorder (HCC)    GAD, panic, depression  . Prediabetes   . Tobacco use     Past Surgical History:  Procedure Laterality Date  . CRANIOTOMY N/A 03/26/2015   Procedure: CRANIOTOMY HYPOPHYSECTOMY  TRANSNASAL APPROACH;  Surgeon: Consuella Lose, MD;  Location: [place] [facility];  Service: Neurosurgery;  Laterality: N/A;  . SINUS ENDO W/FUSION N/A 03/26/2015   Procedure: ENDOSCOPIC SINUS SURGERY WITH NAVIGATION;  Surgeon: Ruby Cola, MD;  Location: [facility];  Service: ENT;  Laterality: N/A;    Family History  Problem Relation Age of Onset  . Allergic rhinitis Mother   . Allergic rhinitis Father   . Prostate cancer Father   . Hyperlipidemia Mother   . Hyperlipidemia Father     Social History   Social History  . Marital status: Single    Spouse name: N/A  . Number of children: N/A  . Years of education: N/A   Occupational History  . intake speacialists    Social History Main Topics  . Smoking status: Former Smoker    Packs/day: 1.00    Years: 14.00    Types: Cigarettes    Start date: 07/20/2008  . Smokeless tobacco: Never Used     Comment: chews Nicotine gum  . Alcohol use 0.0 oz/week     Comment: wine  . Drug use: No  . Sexual activity: Not Asked   Other Topics Concern  . 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  LORazepam (ATIVAN) 0.5 MG tablet, Take 1 tablet (0.5 mg total) by mouth 2 (two) times daily. Only as needed for severe anxiety. Use a little as possible., Disp: 15 tablet, Rfl: 0 .  Polyvinyl Alcohol-Povidone (REFRESH OP), Place 1 drop into both eyes., Disp: , Rfl:  .  sodium chloride (OCEAN) 0.65 % SOLN nasal spray, Place 2 sprays into both nostrils 4 (four) times daily., Disp: , Rfl:  .  SYNTHROID 112 MCG tablet, TAKE 1 TABLET(112 MCG) BY MOUTH DAILY BEFORE BREAKFAST, Disp: 45 tablet, Rfl: 2  EXAM:  Vitals:   02/24/16 0802  BP: 120/80  Pulse: 82  Temp: 98.3 F (36.8  C)    Body mass index is 23.17 kg/m.  GENERAL: vitals reviewed and listed above, alert, oriented, appears well hydrated and in no acute distress  HEENT: atraumatic, conjunttiva clear, no obvious abnormalities on inspection of external nose and ears  NECK: no obvious masses on inspection  LUNGS: clear to auscultation bilaterally, no wheezes, rales or rhonchi, good air movement  CV: HRRR, no peripheral edema  MS: moves all extremities without noticeable abnormality  PSYCH: pleasant and cooperative, no obvious depression or anxiety  ASSESSMENT AND PLAN:  Discussed the following assessment and plan:  Palpitations - Plan: TSH, T4, Free, Ambulatory referral to Cardiology, EKG 12-Lead  Pre-syncope - Plan: CBC (no diff), Basic metabolic panel, Ambulatory referral to Cardiology, EKG 12-Lead  Anxiety  Other specified hypothyroidism  Pituitary apoplexy ([place]) - Plan: Prolactin  Panic disorder  -EKG read and interpreted with NSR -she is worried about her heart and we discussed obtaining echo and holter monitor versus cardiology eva - she opted to see a cardiologist and a referral was placed. -labs to r/o other and advised endo follow up -am glad she is seeing a psychiatrist as she admits to generalized and at times severe anxiety and panic and finding a controller med has been a challenge due to her reported intolerances and fears of taking medications; advised to continue CBT -Patient advised to return or notify a doctor immediately if symptoms worsen or persist or new concerns arise.  Patient Instructions  BEFORE YOU LEAVE: -follow up: 3-4 months -EKG -labs  -see the psychiatrist as planned  -Please schedule follow up with Dr. Cruzita Lederer if symptoms persist  -We placed a referral for you as discussed to the cardiologist. It usually takes about 1-2 weeks to process and schedule this referral. If you have not heard from [place] regarding this appointment in 2 weeks please contact our  office.    Colin Benton R., DO

## 2016-02-23 NOTE — Telephone Encounter (Signed)
PLEASE NOTE: All timestamps contained within this report are represented as [place] Standard Time. CONFIDENTIALTY NOTICE: This fax transmission is intended only for the addressee. It contains information that is legally privileged, confidential or otherwise protected from use or disclosure. If you are not the intended recipient, you are strictly prohibited from reviewing, disclosing, copying using or disseminating any of this information or taking any action in reliance on or regarding this information. If you have received this fax in error, please notify us immediately by telephone so that we can arrange for its return to [place]. Phone: [phone], Toll-Free: [phone], Fax: [phone] Page: 1 of 1 Call Id: [id number] [name] Patient Name: Alyssa Bartlett DOB: 06-15-1971 Initial Comment Caller States she is calling to follow up about having some labs done and possible an EKG, was seen last night for an anxiety/ panic attack where she was weak and sweating Nurse Assessment Nurse: Dimas Chyle, RN, Dellis Filbert Date/Time (Eastern Time): 02/23/2016 8:34:49 AM Confirm and document reason for call. If symptomatic, describe symptoms. You must click the next button to save text entered. ---Caller states she is calling to follow up about having some labs done and possible an EKG, was seen last night for an anxiety/ panic attack where she was weak and sweating. Has the patient traveled out of the country within the last 30 days? ---No Does the patient have any new or worsening symptoms? ---No Please document clinical information provided and list any resource used. ---Advised caller that I would assist with scheduling an appointment with PCP. Guidelines Guideline Title Affirmed Question Affirmed Notes Final Disposition User Clinical Call Dimas Chyle, RN, Dellis Filbert Comments Caller is non-symptomatic this morning.

## 2016-02-23 NOTE — Telephone Encounter (Signed)
Spoke to patient. Patient not having sx(s) this morning.  Advised patient to go to ED if sx(s) present today.  Encouraged patient to keep visit w/ Dr. Maudie Mercury on 09/07. Patient was very grateful for phone call.

## 2016-02-24 ENCOUNTER — Encounter: Payer: Self-pay | Admitting: Family Medicine

## 2016-02-24 ENCOUNTER — Ambulatory Visit (INDEPENDENT_AMBULATORY_CARE_PROVIDER_SITE_OTHER): Payer: 59 | Admitting: Family Medicine

## 2016-02-24 VITALS — BP 120/80 | HR 82 | Temp 98.3°F | Ht 64.0 in | Wt 135.0 lb

## 2016-02-24 DIAGNOSIS — R55 Syncope and collapse: Secondary | ICD-10-CM | POA: Diagnosis not present

## 2016-02-24 DIAGNOSIS — E038 Other specified hypothyroidism: Secondary | ICD-10-CM | POA: Diagnosis not present

## 2016-02-24 DIAGNOSIS — R002 Palpitations: Secondary | ICD-10-CM

## 2016-02-24 DIAGNOSIS — E236 Other disorders of pituitary gland: Secondary | ICD-10-CM

## 2016-02-24 DIAGNOSIS — F410 Panic disorder [episodic paroxysmal anxiety] without agoraphobia: Secondary | ICD-10-CM

## 2016-02-24 DIAGNOSIS — F419 Anxiety disorder, unspecified: Secondary | ICD-10-CM | POA: Diagnosis not present

## 2016-02-24 LAB — CBC
HEMATOCRIT: 41.8 % (ref 36.0–46.0)
Hemoglobin: 14.2 g/dL (ref 12.0–15.0)
MCHC: 34 g/dL (ref 30.0–36.0)
MCV: 90.6 fl (ref 78.0–100.0)
Platelets: 293 10*3/uL (ref 150.0–400.0)
RBC: 4.61 Mil/uL (ref 3.87–5.11)
RDW: 13.4 % (ref 11.5–15.5)
WBC: 5.1 10*3/uL (ref 4.0–10.5)

## 2016-02-24 LAB — BASIC METABOLIC PANEL
BUN: 14 mg/dL (ref 6–23)
CHLORIDE: 105 meq/L (ref 96–112)
CO2: 30 mEq/L (ref 19–32)
CREATININE: 0.72 mg/dL (ref 0.40–1.20)
Calcium: 9.3 mg/dL (ref 8.4–10.5)
GFR: 92.95 mL/min (ref >60.00)
Glucose, Bld: 93 mg/dL (ref 70–99)
Potassium: 4.5 mEq/L (ref 3.5–5.1)
Sodium: 141 mEq/L (ref 135–145)

## 2016-02-24 LAB — T4, FREE: Free T4: 1.51 ng/dL (ref 0.60–1.60)

## 2016-02-24 LAB — TSH: TSH: 0.19 u[IU]/mL — AB (ref 0.35–4.50)

## 2016-02-24 NOTE — Patient Instructions (Signed)
BEFORE YOU LEAVE: -follow up: 3-4 months -EKG -labs  -see the psychiatrist as planned  -Please schedule follow up with Dr. Cruzita Lederer if symptoms persist  -We placed a referral for you as discussed to the cardiologist. It usually takes about 1-2 weeks to process and schedule this referral. If you have not heard from [place] regarding this appointment in 2 weeks please contact our office.

## 2016-02-25 LAB — PROLACTIN: PROLACTIN: 6.6 ng/mL

## 2016-03-02 ENCOUNTER — Ambulatory Visit: Payer: Self-pay | Admitting: Internal Medicine

## 2016-03-02 ENCOUNTER — Ambulatory Visit (INDEPENDENT_AMBULATORY_CARE_PROVIDER_SITE_OTHER): Payer: 59 | Admitting: Psychology

## 2016-03-02 DIAGNOSIS — F410 Panic disorder [episodic paroxysmal anxiety] without agoraphobia: Secondary | ICD-10-CM | POA: Diagnosis not present

## 2016-03-09 ENCOUNTER — Encounter: Payer: Self-pay | Admitting: Internal Medicine

## 2016-03-13 ENCOUNTER — Ambulatory Visit (INDEPENDENT_AMBULATORY_CARE_PROVIDER_SITE_OTHER): Payer: 59 | Admitting: Psychology

## 2016-03-13 DIAGNOSIS — F410 Panic disorder [episodic paroxysmal anxiety] without agoraphobia: Secondary | ICD-10-CM | POA: Diagnosis not present

## 2016-03-24 ENCOUNTER — Encounter: Payer: Self-pay | Admitting: Internal Medicine

## 2016-03-24 ENCOUNTER — Ambulatory Visit (INDEPENDENT_AMBULATORY_CARE_PROVIDER_SITE_OTHER): Payer: 59 | Admitting: Internal Medicine

## 2016-03-24 VITALS — BP 128/72 | HR 77 | Ht 64.0 in | Wt 142.0 lb

## 2016-03-24 DIAGNOSIS — R002 Palpitations: Secondary | ICD-10-CM | POA: Diagnosis not present

## 2016-03-24 NOTE — Progress Notes (Signed)
New Outpatient Visit Date: 03/24/2016  Referring Provider: Lucretia Kern, DO [place],  [id number]  Chief Complaint  Patient presents with  . Palpitations    HPI:  Alyssa Bartlett is a 45 y.o. year-old female with history of prediabetes, hyperlipidemia, hypothyroidism with pituitary apoplexy followed by Dr. Cruzita Lederer, and anxiety/depression, who has been referred by Dr. Maudie Mercury for evaluation of palpitations and presyncope. She has noted intermittent palpitations for the last 1-1.5 years, beginning around the time that her pituitary mass was discovered. Initially, she noted intermittent fluttering in her chest. However, over the last several months it has been less of a fluttering and more the sensation of a skipped beat. The palpitations have occurred on a daily basis, sometimes several times per day, though their frequency has decreased significantly over the last month. The patient notes that she was started on Lexapro at that time for anxiety and has noticed marked improvement in her symptoms. She has only rare skipped beats now. She denies lightheadedness. She has felt some dizziness and "weird rushing sensation" in the chest during her panic attacks, as well as mild dyspnea. These are under much better control with Lexapro. She has not experienced chest pain or shortness of breath outside of her panic attacks. She is able to climb 3 flights of stairs without angina. She denies a history of cardiac disease or prior cardiac workup. She is not drinking any caffeine and rarely eats chocolate.  --------------------------------------------------------------------------------------------------  Cardiovascular History & Procedures: Cardiovascular Problems:  Palpitations  Risk Factors:  None (family history of CAD, though not premature)  Cath/PCI:  None  CV Surgery:  None  EP Procedures and Devices:  None  Non-Invasive Evaluation(s):  None  Recent CV Pertinent  Labs: Lab Results  Component Value Date   CHOL 163 03/12/2015   HDL 58.30 03/12/2015   LDLCALC 89 03/12/2015   TRIG 79.0 03/12/2015   CHOLHDL 3 03/12/2015   K 4.5 02/24/2016   BUN 14 02/24/2016   CREATININE 0.72 02/24/2016    --------------------------------------------------------------------------------------------------  Past Medical History:  Diagnosis Date  . Allergic reaction to food    ? shellfish  . Hemorrhoid 09/17/2015  . History of chicken pox   . Hypothyroidism    managed by [facility]  . Mild hyperlipidemia   . Mood disorder (HCC)    GAD, panic, depression  . Prediabetes   . Tobacco use     Past Surgical History:  Procedure Laterality Date  . CRANIOTOMY N/A 03/26/2015   Procedure: CRANIOTOMY HYPOPHYSECTOMY TRANSNASAL APPROACH;  Surgeon: Consuella Lose, MD;  Location: [place] [facility];  Service: Neurosurgery;  Laterality: N/A;  . SINUS ENDO W/FUSION N/A 03/26/2015   Procedure: ENDOSCOPIC SINUS SURGERY WITH NAVIGATION;  Surgeon: Ruby Cola, MD;  Location: [place] [facility];  Service: ENT;  Laterality: N/A;    Outpatient Encounter Prescriptions as of 03/24/2016  Medication Sig  . escitalopram (LEXAPRO) 20 MG tablet Take 20 mg by mouth at bedtime.  Marland Kitchen LORazepam (ATIVAN) 0.5 MG tablet Take 1 tablet (0.5 mg total) by mouth 2 (two) times daily. Only as needed for severe anxiety. Use a little as possible.  . nicotine polacrilex (EQ NICOTINE) 4 MG gum Take 4 mg by mouth as needed for smoking cessation.  . Polyvinyl Alcohol-Povidone (REFRESH OP) Place 1 drop into both eyes.  . sodium chloride (OCEAN) 0.65 % SOLN nasal spray Place 2 sprays into both nostrils 4 (four) times daily.  Marland Kitchen SYNTHROID 112 MCG tablet TAKE 1  TABLET(112 MCG) BY MOUTH DAILY BEFORE BREAKFAST   No facility-administered encounter medications on file as of 03/24/2016.     Allergies: Shrimp [shellfish allergy]  Social History   Social History  . Marital status: Single    Spouse  name: N/A  . Number of children: N/A  . Years of education: N/A   Occupational History  . intake speacialists    Social History Main Topics  . Smoking status: Former Smoker    Packs/day: 1.00    Years: 14.00    Types: Cigarettes    Start date: 07/20/2008    Quit date: 2011  . Smokeless tobacco: Never Used     Comment: chews Nicotine gum  . Alcohol use 6.0 oz/week    10 Glasses of wine per week  . Drug use: No  . Sexual activity: Not on file   Other Topics Concern  . 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 Allergic rhinitis Mother   . Hyperlipidemia Mother   . Allergic rhinitis Father   . Prostate cancer Father   . Hyperlipidemia Father   . Heart disease Father 17    CABG  . Heart attack Father 28  . Heart disease Maternal Grandfather     Review of Systems: Review of Systems  Constitutional: Negative for weight loss (Decreased appetite noted).  HENT: Negative.   Eyes: Negative.   Respiratory: Negative.   Cardiovascular: Positive for palpitations.  Gastrointestinal: Positive for blood in stool (Occasional scant blood with constipation), constipation and nausea. Negative for abdominal pain.  Genitourinary: Negative.   Musculoskeletal: Negative.   Skin: Negative.   Neurological: Negative.   Endo/Heme/Allergies: Negative.   Psychiatric/Behavioral: The patient is nervous/anxious.    --------------------------------------------------------------------------------------------------  Physical Exam: BP 128/72   Pulse 77   Ht 5\' 4"  (1.626 m)   Wt 142 lb (64.4 kg)   LMP 02/19/2016   BMI 24.37 kg/m   General:  Well-developed, well nourished woman, seated comfortably in  the exam room. HEENT: No conjunctival pallor or scleral icterus.  Moist mucous membranes.  OP clear. Neck: Supple without lymphadenopathy, thyromegaly, JVD, or HJR.  No carotid bruit. Lungs: Normal work of breathing.  Clear to auscultation bilaterally without wheezes or crackles. CV: Regular rate and rhythm without murmurs, rubs, or gallops.  Non-displaced PMI. Abd: Bowel sounds present.  Soft, NT/ND without hepatosplenomegaly Ext: No lower extremity edema.  Radial, PT, and DP pulses are 2+ bilaterally Skin: warm and dry without rash Neuro: CNIII-XII intact.  Strength and fine-touch sensation intact in upper and lower extremities bilaterally. Psych: Normal mood and affect.  EKG:  Normal sinus rhythm without significant abnormalities.  Lab Results  Component Value Date   WBC 5.1 02/24/2016   HGB 14.2 02/24/2016   HCT 41.8 02/24/2016   MCV 90.6 02/24/2016   PLT 293.0 02/24/2016    Lab Results  Component Value Date   NA 141 02/24/2016   K 4.5 02/24/2016   CL 105 02/24/2016   CO2 30 02/24/2016   BUN 14 02/24/2016   CREATININE 0.72 02/24/2016   GLUCOSE 93 02/24/2016   ALT  15 01/09/2009    Lab Results  Component Value Date   CHOL 163 03/12/2015   HDL 58.30 03/12/2015   LDLCALC 89 03/12/2015   TRIG 79.0 03/12/2015   CHOLHDL 3 03/12/2015   TSH (02/24/16): 0.19 (low) Free T4 (02/24/16): 1.51 (normal)  --------------------------------------------------------------------------------------------------  ASSESSMENT AND PLAN: 1. Palpitations  The patient reports more than a year of intermittent palpitations. She does not endorse worrisome symptoms such as syncope, though she has felt lightheaded at times during a panic attack. Does not seem that her palpitations are clearly related to her panic attacks as far as timing is concerned. However, she has noticed a marked decrease in the frequency of palpitations since starting Lexapro month ago. It may be that some of her palpitations in the  past were also due to endocrine fluctuations that have stabilized. Her EKG and exam are unremarkable today. Given her significant improvement in symptoms over the last month, we have agreed to defer further testing at this time. If the palpitations increase again, she may benefit from placement of an event monitor to further characterize the episodes. We will have Ms. Delarosa follow-up in 3 months to reassess her symptoms. She will contact us in the meantime if new concerns arise.  Follow-up: Return to clinic in 3 months.  Nelva Bush, MD 03/24/2016 10:20 AM

## 2016-03-24 NOTE — Patient Instructions (Signed)
Medication Instructions:  The current medical regimen is effective;  continue present plan and medications.  Follow-Up: Follow up in 3 months with Dr End.  If you need a refill on your cardiac medications before your next appointment, please call your pharmacy.  Thank you for choosing [facility]!!

## 2016-03-29 ENCOUNTER — Ambulatory Visit (INDEPENDENT_AMBULATORY_CARE_PROVIDER_SITE_OTHER): Payer: 59 | Admitting: Psychology

## 2016-03-29 DIAGNOSIS — F411 Generalized anxiety disorder: Secondary | ICD-10-CM | POA: Diagnosis not present

## 2016-04-10 ENCOUNTER — Ambulatory Visit (INDEPENDENT_AMBULATORY_CARE_PROVIDER_SITE_OTHER): Payer: 59 | Admitting: Psychology

## 2016-04-10 DIAGNOSIS — F410 Panic disorder [episodic paroxysmal anxiety] without agoraphobia: Secondary | ICD-10-CM

## 2016-04-24 ENCOUNTER — Ambulatory Visit (INDEPENDENT_AMBULATORY_CARE_PROVIDER_SITE_OTHER): Payer: 59 | Admitting: Psychology

## 2016-04-24 DIAGNOSIS — F410 Panic disorder [episodic paroxysmal anxiety] without agoraphobia: Secondary | ICD-10-CM | POA: Diagnosis not present

## 2016-05-01 ENCOUNTER — Other Ambulatory Visit: Payer: Self-pay | Admitting: Internal Medicine

## 2016-05-03 ENCOUNTER — Ambulatory Visit (INDEPENDENT_AMBULATORY_CARE_PROVIDER_SITE_OTHER): Payer: 59 | Admitting: Internal Medicine

## 2016-05-03 VITALS — BP 121/73 | HR 85 | Ht 64.0 in | Wt 136.4 lb

## 2016-05-03 DIAGNOSIS — E236 Other disorders of pituitary gland: Secondary | ICD-10-CM

## 2016-05-03 DIAGNOSIS — E038 Other specified hypothyroidism: Secondary | ICD-10-CM

## 2016-05-03 NOTE — Progress Notes (Signed)
Patient ID: Alyssa Bartlett, female   DOB: [birth year], 45 y.o.   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 Last visit 6 mo ago.  She started Lexapro for anxiety for 8 weeks >> feels much better.   Hypothyroidism: Pt. has been dx with hypothyroidism in 2011; started Synthroid, then Levothyroxine >> did not feel good (aches, pains, fatigue) she was seeing [facility] providers and also saw endocrinology before; is on NatureThroid 48.7 mg bid (~160 mcg LT4 daily) >> was feeling better, weight normalized >> however, had increased anxiety - after an allergic rxn (or Niacin flush - was taking a B complex at that time), worse fatigue, insomnia. She is now on Synthroid. Lexapro helped the above sxs.  Now on Synthroid 112 mcg daily. She takes the LT4: - fasting in am - with water - separated by 1h from b'fast  - no calcium, iron, PPIs, multivitamins   I reviewed pt's thyroid tests: Lab Results  Component Value Date   TSH 0.19 (L) 02/24/2016   TSH 0.07 (L) 10/28/2015   TSH 0.15 (L) 04/09/2015   TSH 0.12 (L) 02/18/2015   TSH 1.03 01/01/2015   FREET4 1.51 02/24/2016   FREET4 1.16 10/28/2015   FREET4 1.77 (H) 04/09/2015   FREET4 2.06 (H) 02/18/2015   FREET4 0.90 01/01/2015  09/17/2014: 0.779, TT4 6.3 (4.5-12)  Pt describes: - + fatigue - + mm weakness - hip pain - resolved flushing after starting Lexapro - resolved anxiety after starting Lexapro - no palpitations - no tremors - + constipation - no hair loss  Pt denies feeling nodules in neck, no hoarseness, no dysphagia/odynophagia, SOB with lying down.  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  - MRI (03/26/2015): The pituitary is enlarged with a  fluid level evident on T2 weighted images. Blood products are present in the pituitary compatible with pituitary apoplexy. The gland measures 26 mm cephalo caudad dimension. It measures 19 x 19 mm in cross-sectional dimension. This displaces the optic chiasm superiorly. There is peripheral enhancement about the gland.  - s/p TSR b/c of visual loss (Dr. Kathyrn Sheriff) - MRI (03/28/2015): Sequelae of recent transsphenoidal pituitary resection are identified. Surgical packing material is present in the posterior nasal cavity. The surgical cavity in the sella/ suprasellar cistern measures approximately 19 x 16 mm and demonstrates uniform peripheral enhancement of 3-4 mm in thickness, with anterior aspect open to the nasal cavity. The pituitary infundibulum is displaced superiorly and slightly rightward by these postoperative changes. No discrete nodular enhancement/residual mass is identified. There is no mass effect on the optic chiasm. There may be mild displacement of the prechiasmatic optic nerves, left more so than right.  - another visual field test at the end of 2016 >> significant improvement in vision after surgery. 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  Pituitary labs recently normal, except low TSH:  Component     Latest Ref Rng 10/28/2015  TSH     0.35 - 4.50 uIU/mL 0.07 (L)  T4,Free(Direct)     0.60 - 1.60 ng/dL 1.16  Prolactin      7.1  Cortisol, Plasma      15.0  LH      2.55  FSH  4.9   Prev: Component     Latest Ref Rng 03/28/2015 04/09/2015  Cortisol - AM     6.7 - 22.6 ug/dL 16.7   TSH     0.35 - 4.50 uIU/mL  0.15 (L)  Free T4     0.60 - 1.60 ng/dL  1.77 (H)  T3, Free     2.3 - 4.2 pg/mL  3.4  Cortisol, Plasma       11.5  C206 ACTH     6 - 50 pg/mL  21  Prolactin       5.3  No increased thirst or urination.  09/16/2015: MRI pituitary: showed perhaps minimal peripheral residual tumor but otherwise a gross resection, mild  sphenoid and right maxillary T2-hyperintense mucous retention cysts, and otherwise nicely patent and well-healed sinusotomies.   She also has a history of prediabetes, mild HL.  ROS: Constitutional: see HPI Eyes: no blurry vision, no xerophthalmia ENT: no sore throat, no nodules palpated in throat, no dysphagia/odynophagia, hoarseness Cardiovascular: no CP/no SOB/palpitations/leg swelling Respiratory: no cough/SOB Gastrointestinal: + N/no V/D/+ C Musculoskeletal: + muscle weakness - thighs/joint aches Skin: no rashes Neurological: no tremors/no numbness/tingling/dizziness, no HA  I reviewed pt's medications, allergies, PMH, social hx, family hx, and changes were documented in the history of present illness. Otherwise, unchanged from my initial visit note.  Past Medical History  Diagnosis Date  . Hypothyroidism    . Mood disorder (HCC)     GAD, panic, depression  . History of chicken pox   . Allergic reaction to food     ? shellfish  . Prediabetes   . Mild hyperlipidemia   . Tobacco use    Past Surgical History:  Procedure Laterality Date  . CRANIOTOMY N/A 03/26/2015   Procedure: CRANIOTOMY HYPOPHYSECTOMY TRANSNASAL APPROACH;  Surgeon: Consuella Lose, MD;  Location: [facility];  Service: Neurosurgery;  Laterality: N/A;  . SINUS ENDO W/FUSION N/A 03/26/2015   Procedure: ENDOSCOPIC SINUS SURGERY WITH NAVIGATION;  Surgeon: Ruby Cola, MD;  Location: [facility];  Service: ENT;  Laterality: N/A;   History   Social History  . Marital Status: Single    Spouse Name: N/A  . Number of Children: 0   Occupational History  . intake speacialists    Social History Main Topics  . Smoking status: Former Smoker -- 1.00 packs/day for 14 years    Types: Cigarettes    Start date: 07/20/2008  . Smokeless tobacco: Not on file  . Alcohol Use: Yes     Comment: wine 2-3 drinks 4-5x a week  . 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 escitalopram (LEXAPRO) 20 MG tablet Take 20 mg by mouth at bedtime.    Marland Kitchen LORazepam (ATIVAN) 0.5 MG tablet Take 1 tablet (0.5 mg total) by mouth 2 (two) times daily. Only as needed for severe anxiety. Use a little as possible. 15 tablet 0  . nicotine polacrilex (EQ NICOTINE) 4 MG gum Take 4 mg by mouth as needed for smoking cessation.    . Polyvinyl Alcohol-Povidone (REFRESH OP) Place  1 drop into both eyes.    . sodium chloride (OCEAN) 0.65 % SOLN nasal spray Place 2 sprays into both nostrils 4 (four) times daily.    Marland Kitchen SYNTHROID 112 MCG tablet TAKE 1 TABLET(112 MCG) BY MOUTH DAILY BEFORE BREAKFAST 45 tablet 0   No current facility-administered medications on file prior to visit.    Allergies  Allergen Reactions  . Shrimp [Shellfish Allergy]     unknown   Family History  Problem Relation Age of Onset  . Allergic rhinitis Mother   . Hyperlipidemia Mother   . Allergic rhinitis Father   . Prostate cancer Father   . Hyperlipidemia Father   . Heart disease Father 57    CABG  . Heart attack Father 21  . Heart disease Maternal Grandfather   HTN in M and F  PE: BP 121/73 (BP Location: Left Arm, Patient Position: Sitting, Cuff Size: Normal)   Pulse 85   Ht 5\' 4"  (1.626 m)   Wt 136 lb 6.4 oz (61.9 kg)   SpO2 100%   BMI 23.41 kg/m  Wt Readings from Last 3 Encounters:  05/03/16 136 lb 6.4 oz (61.9 kg)  03/24/16 142 lb (64.4 kg)  02/24/16 135 lb (61.2 kg)   Constitutional: normal weight, in NAD Eyes: PERRLA, EOMI, no exophthalmos ENT: moist mucous membranes, no thyromegaly,, no cervical lymphadenopathy Cardiovascular: RRR, No MRG Respiratory: CTA B Gastrointestinal: abdomen soft, NT, ND,  BS+ Musculoskeletal: no deformities, strength intact in all 4 Skin: moist, warm, no rashes non- Neurological: no tremor with outstretched hands, DTR normal in all 4  ASSESSMENT: 1. Hypothyroidism  01/01/2015: Thyroid U/S:   Right thyroid lobe: 3.3 cm x 0.8 cm x 0.9 cm. Small hypoechoic nodule at the inferior right thyroid measures approximately 3 mm. Heterogeneous appearance of the thyroid tissue  Left thyroid lobe: 3.1 cm x 0.6 cm x 0.8 cm. No nodules. Heterogeneous appearance of the thyroid tissue.  Isthmus Thickness: 3 mm. No nodules visualized.  Lymphadenopathy: None visualized. IMPRESSION: Relatively unremarkable appearance of the thyroid with single small benign-appearing nodule on the right. No left-sided nodules.  2. Pituitary apoplexy  PLAN:  1. Patient with long-standing hypothyroidism, previously on NatureThroid therapy, now on Synthroid d.a.w. She appears euthyroid, except she still has fatigue and mm weakness. She does not appear to have a goiter, thyroid nodules, or neck compression symptoms. - Her TSH decreased after her pituitary apoplexy, after previously being normal on the equivalent dose of Synthroid 125 >> 112 g daily. We discussed that she may have a component of central hypothyroidism over imposed over primary hypothyroidism. It is difficult to differentiate between the 2, however, the treatment is the same, with levothyroxine. Monitoring the treatment is difficult in the setting of central hypothyroidism, because TSH is not usable. For this reason, I advised her to come back for labs in a.m., fasting, after she skips levothyroxine that day. We will also check thyroid antibodies. - We discussed about correct intake of levothyroxine, fasting, with water, separated by at least 30 minutes from breakfast, and separated by more than 4 hours from calcium, iron, multivitamins, acid reflux medications (PPIs). She is taking it correctly - I will see her back in 6  months  2. 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 The surgery was performed because of visual loss in right eye. Her visual capacity improved after the surgery. She is now feeling back to baseline especially after starting her anxiety medication. She still has  some fatigue, but not increased. 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 No signs of DI. - She had a visual field at the end of last year >> much improved - She has pituitary MRI's by her neurosurgeon, Dr. Kathyrn Sheriff - last MRI in 08/2015 - there were no concerns (there was questionable scar tissue versus recurrence in the sella, however, this was felt to be scar tissue). She will have a repeat MRI in 08/2016. - I will see the patient back in 6 months. We will check her pituitary labs again at that time.  Needs refills LT4.  Orders Placed This Encounter  Procedures  . T4, free  . T3, free  . TSH  . Thyroid peroxidase antibody  . Thyroglobulin antibody   Addendum: Pt did not return for labs.  Philemon Kingdom, MD PhD [facility] Endocrinology

## 2016-05-03 NOTE — Patient Instructions (Signed)
Please come back for labs fasting, skipping Levothyroxine that morning.  Please come back for a follow-up appointment in 6 months.

## 2016-05-10 ENCOUNTER — Ambulatory Visit (INDEPENDENT_AMBULATORY_CARE_PROVIDER_SITE_OTHER): Payer: 59 | Admitting: Psychology

## 2016-05-10 DIAGNOSIS — F410 Panic disorder [episodic paroxysmal anxiety] without agoraphobia: Secondary | ICD-10-CM

## 2016-05-30 ENCOUNTER — Encounter: Payer: Self-pay | Admitting: Internal Medicine

## 2016-06-16 ENCOUNTER — Other Ambulatory Visit: Payer: Self-pay | Admitting: Internal Medicine

## 2016-06-22 ENCOUNTER — Ambulatory Visit: Payer: Self-pay | Admitting: Internal Medicine

## 2016-07-22 ENCOUNTER — Other Ambulatory Visit: Payer: Self-pay | Admitting: Internal Medicine

## 2016-08-08 ENCOUNTER — Ambulatory Visit (INDEPENDENT_AMBULATORY_CARE_PROVIDER_SITE_OTHER): Payer: 59 | Admitting: Psychology

## 2016-08-08 DIAGNOSIS — F411 Generalized anxiety disorder: Secondary | ICD-10-CM

## 2016-08-14 IMAGING — US US SOFT TISSUE HEAD/NECK
1 series · 14 of 25 positions shown · non-contrast
Comparison: None.

CLINICAL DATA: 44-year-old female. Left-sided thyroid lesions on
physical exam.

EXAM:
THYROID ULTRASOUND
TECHNIQUE: Ultrasound examination of the thyroid gland and adjacent soft
tissues was performed.

[Series 1: us soft tissue head/neck · 0.05mm/px · 14 of 36 slices shown]
[im 1/36]
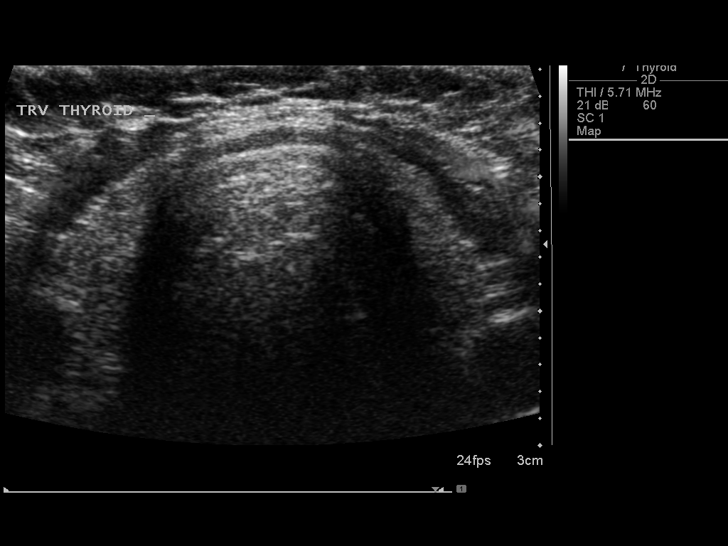
[im 3/36]
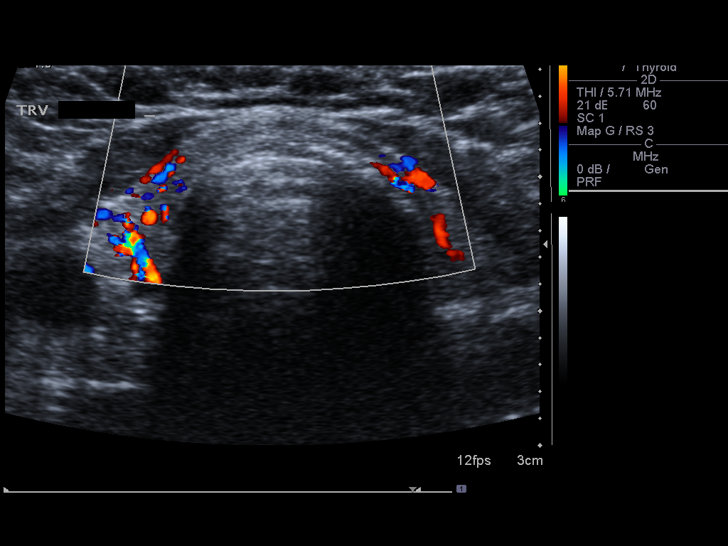
[im 6/36]
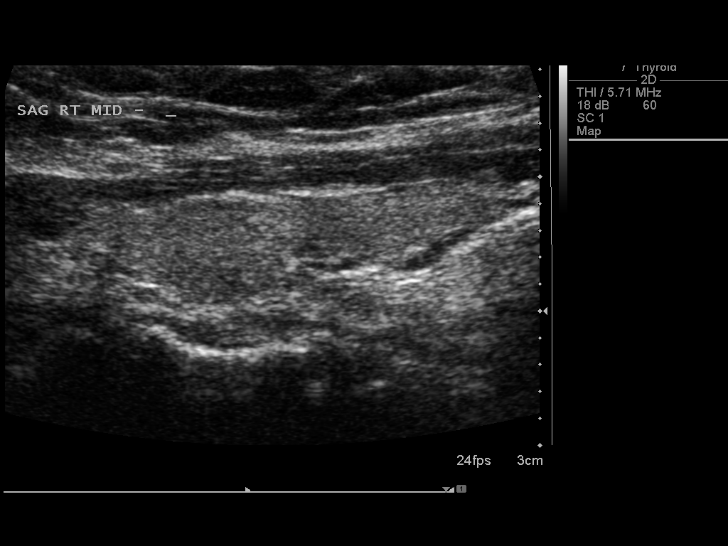
[im 9/36]
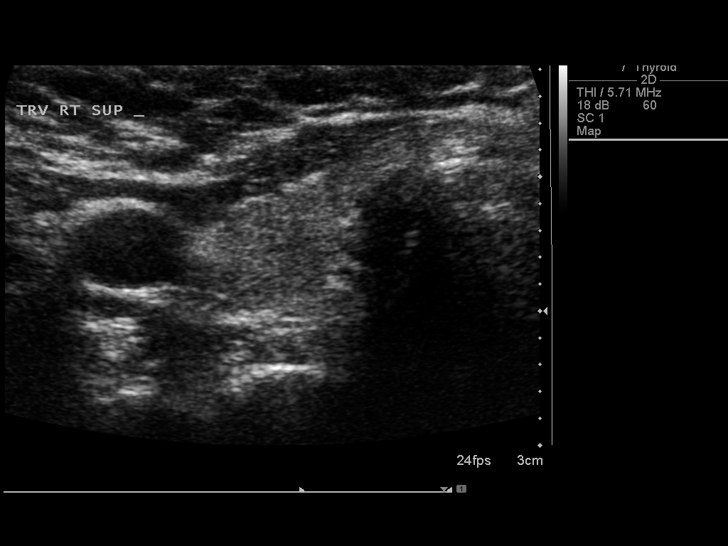
[im 12/36]
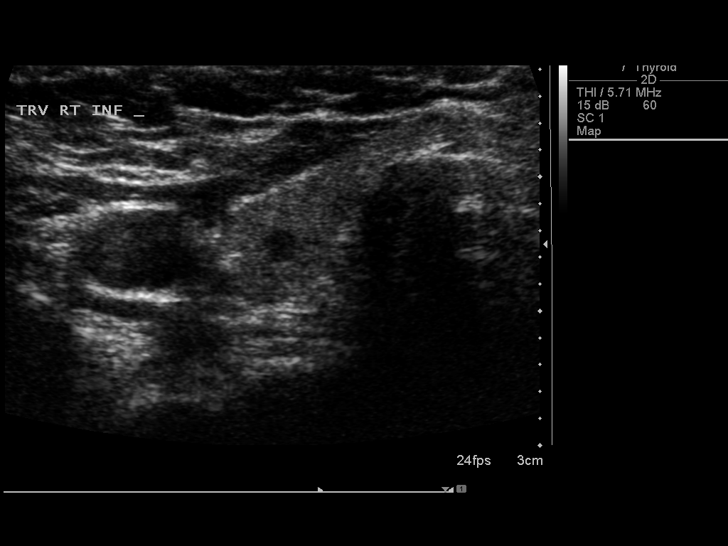
[im 14/36]
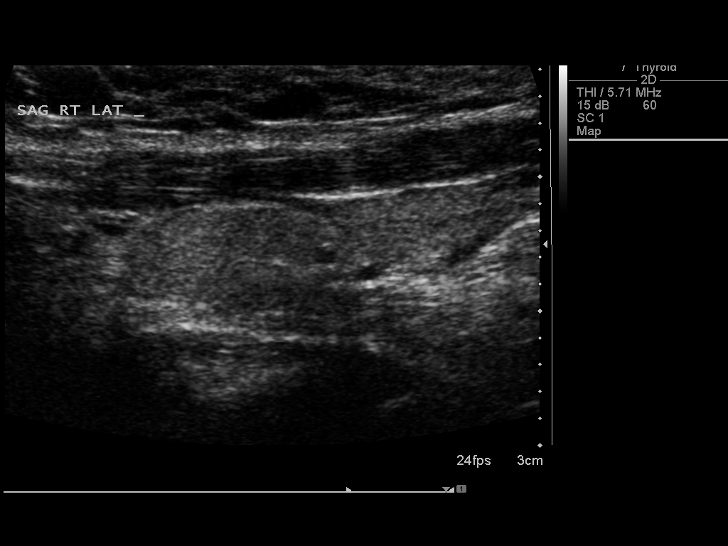
[im 17/36]
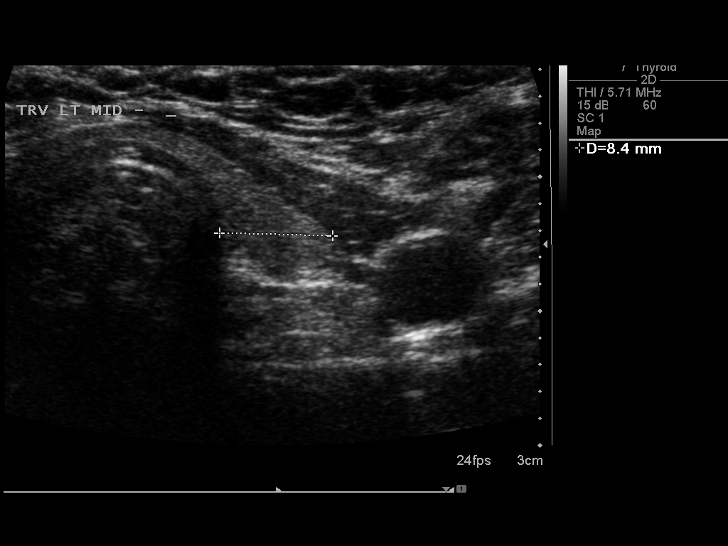
[im 19/36]
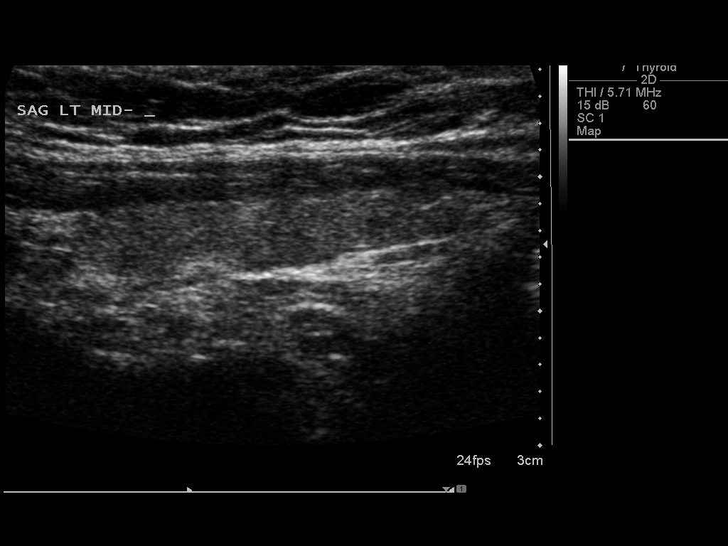
[im 22/36]
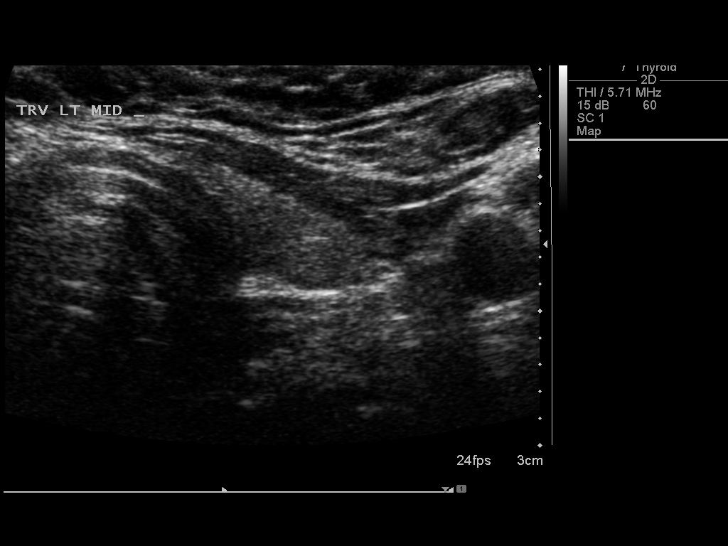
[im 24/36]
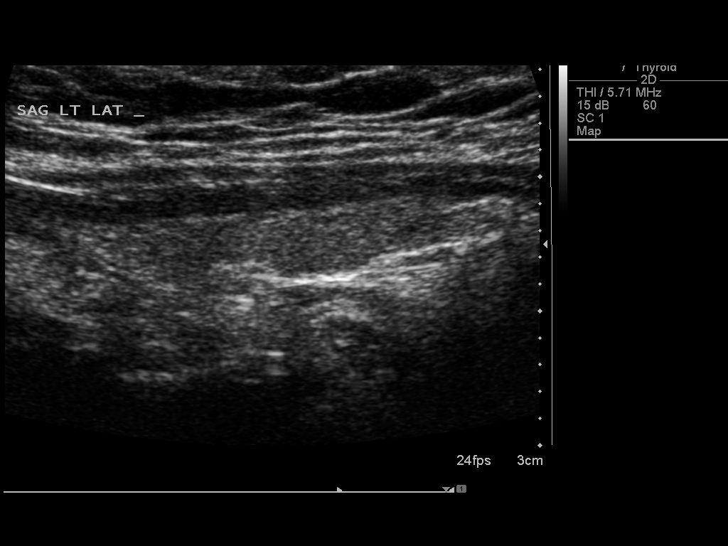
[im 27/36]
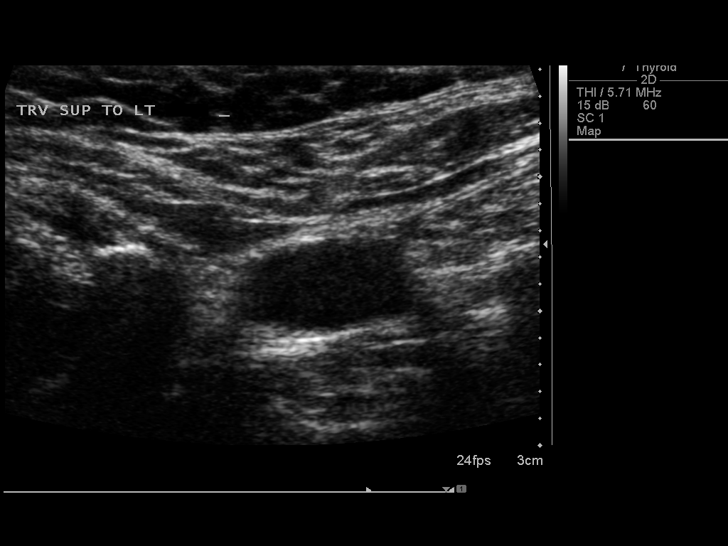
[im 30/36]
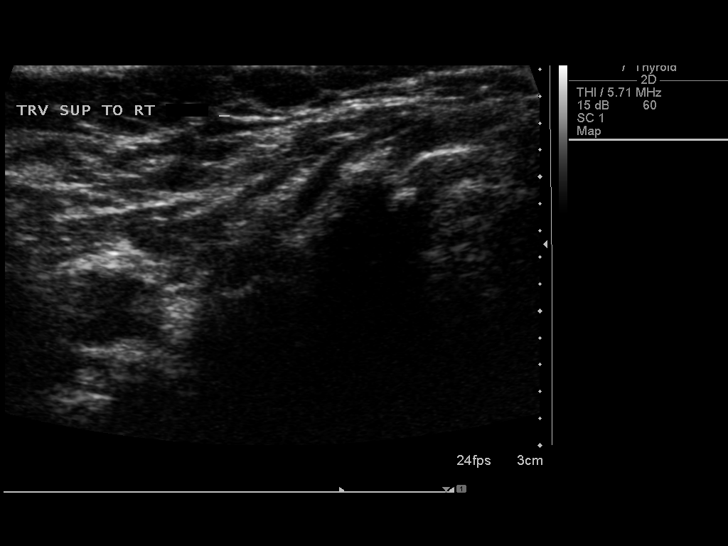
[im 33/36]
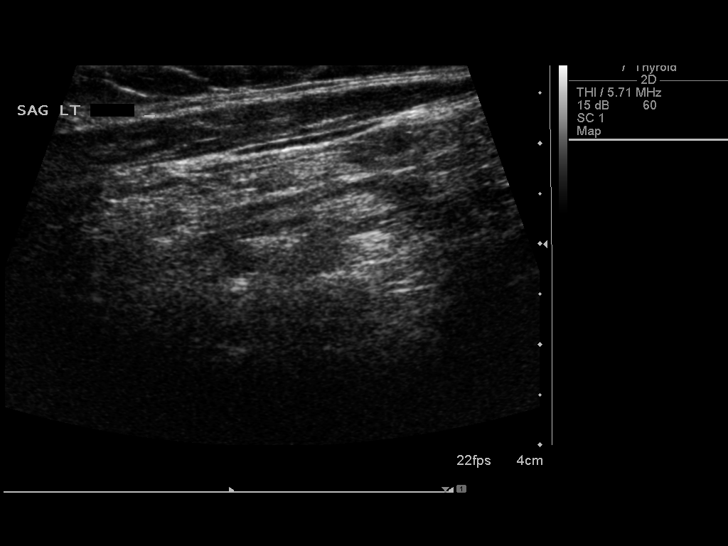
[im 36/36]
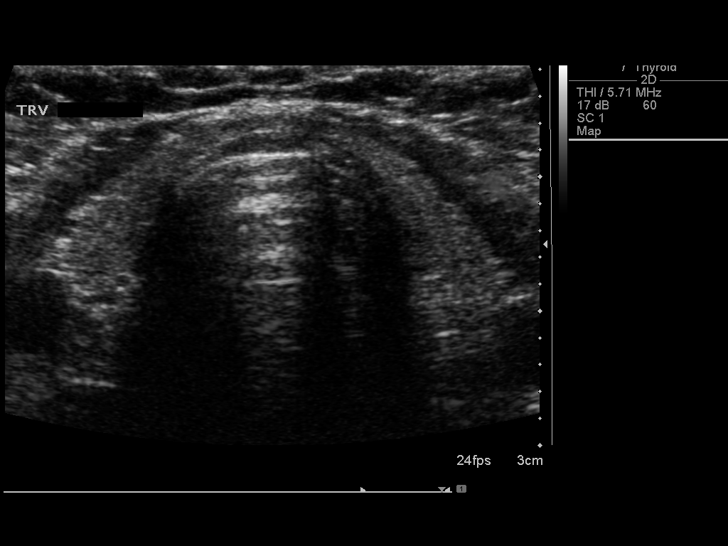

[14 of 25 positions shown; findings below may reference images not displayed]

FINDINGS: Right thyroid lobe

Measurements: 3.3 cm x 0.8 cm x 0.9 cm. Small hypoechoic nodule at
the inferior right thyroid measures approximately 3 mm.
Heterogeneous appearance of the thyroid tissue

Left thyroid lobe

Measurements: 3.1 cm x 0.6 cm x 0.8 cm. No nodules. Heterogeneous
appearance of the thyroid tissue.

Isthmus

Thickness: 3 mm.  No nodules visualized.

Lymphadenopathy

None visualized.
IMPRESSION: Relatively unremarkable appearance of the thyroid with single small
benign-appearing nodule on the right. No left-sided nodules.

Findings do not meet current SRU consensus criteria for biopsy.
Follow-up by clinical exam is recommended. If patient has known risk
factors for thyroid carcinoma, consider follow-up ultrasound in 12
months. If patient is clinically hyperthyroid, consider nuclear
medicine thyroid uptake and scan.Reference: Management of Thyroid
Nodules Detected at US: Society of Radiologists in Ultrasound

## 2016-08-28 ENCOUNTER — Other Ambulatory Visit: Payer: Self-pay | Admitting: Internal Medicine

## 2016-09-07 ENCOUNTER — Ambulatory Visit: Payer: 59 | Admitting: Psychology

## 2016-09-19 ENCOUNTER — Ambulatory Visit (INDEPENDENT_AMBULATORY_CARE_PROVIDER_SITE_OTHER): Payer: 59 | Admitting: Psychology

## 2016-09-19 DIAGNOSIS — F410 Panic disorder [episodic paroxysmal anxiety] without agoraphobia: Secondary | ICD-10-CM

## 2016-10-22 ENCOUNTER — Other Ambulatory Visit: Payer: Self-pay | Admitting: Internal Medicine

## 2016-10-30 ENCOUNTER — Other Ambulatory Visit (INDEPENDENT_AMBULATORY_CARE_PROVIDER_SITE_OTHER): Payer: 59

## 2016-10-30 DIAGNOSIS — E038 Other specified hypothyroidism: Secondary | ICD-10-CM

## 2016-10-30 LAB — T4, FREE: FREE T4: 1.26 ng/dL (ref 0.60–1.60)

## 2016-10-30 LAB — T3, FREE: T3 FREE: 3.6 pg/mL (ref 2.3–4.2)

## 2016-10-30 LAB — TSH: TSH: 0.01 u[IU]/mL — ABNORMAL LOW (ref 0.35–4.50)

## 2016-10-31 ENCOUNTER — Ambulatory Visit (INDEPENDENT_AMBULATORY_CARE_PROVIDER_SITE_OTHER): Payer: 59 | Admitting: Internal Medicine

## 2016-10-31 ENCOUNTER — Encounter: Payer: Self-pay | Admitting: Internal Medicine

## 2016-10-31 VITALS — BP 110/70 | HR 83 | Ht 64.0 in | Wt 143.0 lb

## 2016-10-31 DIAGNOSIS — E236 Other disorders of pituitary gland: Secondary | ICD-10-CM | POA: Diagnosis not present

## 2016-10-31 DIAGNOSIS — E038 Other specified hypothyroidism: Secondary | ICD-10-CM

## 2016-10-31 LAB — THYROGLOBULIN ANTIBODY: Thyroglobulin Ab: <1 IU/mL (ref <2)

## 2016-10-31 LAB — THYROID PEROXIDASE ANTIBODY: Thyroperoxidase Ab SerPl-aCnc: 1 IU/mL (ref <9)

## 2016-10-31 MED ORDER — SYNTHROID 112 MCG PO TABS: ORAL_TABLET | ORAL | 3 refills | Status: DC

## 2016-10-31 NOTE — Progress Notes (Signed)
Patient ID: Alyssa Bartlett, female   DOB: January 26, 1971, 46 y.o.   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 Last visit 6 mo ago.  She started Lexapro for anxiety for 8 weeks >> feels much better.   Hypothyroidism: Pt. has been dx with hypothyroidism in 2011; started Synthroid, then Levothyroxine >> did not feel good (aches, pains, fatigue) she was seeing [facility] providers and also saw endocrinology before; is on NatureThroid 48.7 mg bid (~160 mcg LT4 daily) >> was feeling better, weight normalized >> however, had increased anxiety - after an allergic rxn (or Niacin flush - was taking a B complex at that time), worse fatigue, insomnia. She is now on Synthroid. Lexapro helped the above sxs.  Pt is on levothyroxine 112 mcg daily, taken: - in am - fasting - at least 60 min from b'fast - no Ca, Fe, MVI, PPIs - not on Biotin  I reviewed pt's thyroid tests: Lab Results  Component Value Date   TSH 0.01 Repeated and verified X2. (L) 10/30/2016   TSH 0.19 (L) 02/24/2016   TSH 0.07 (L) 10/28/2015   TSH 0.15 (L) 04/09/2015   TSH 0.12 (L) 02/18/2015   FREET4 1.26 10/30/2016   FREET4 1.51 02/24/2016   FREET4 1.16 10/28/2015   FREET4 1.77 (H) 04/09/2015   FREET4 2.06 (H) 02/18/2015  09/17/2014: 0.779, TT4 6.3 (4.5-12)  Pt describes: - + fatigue - + mm weakness - hip pain - resolved flushing after starting Lexapro - resolved anxiety after starting Lexapro - no palpitations - no tremors - + constipation - no hair loss  Pt denies feeling nodules in neck, no hoarseness, no dysphagia/odynophagia, SOB with lying down.  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  - MRI (03/26/2015): The pituitary is  enlarged with a fluid level evident on T2 weighted images. Blood products are present in the pituitary compatible with pituitary apoplexy. The gland measures 26 mm cephalo caudad dimension. It measures 19 x 19 mm in cross-sectional dimension. This displaces the optic chiasm superiorly. There is peripheral enhancement about the gland.  - s/p TSR b/c of visual loss (Dr. Kathyrn Sheriff) - MRI (03/28/2015): Sequelae of recent transsphenoidal pituitary resection are identified. Surgical packing material is present in the posterior nasal cavity. The surgical cavity in the sella/ suprasellar cistern measures approximately 19 x 16 mm and demonstrates uniform peripheral enhancement of 3-4 mm in thickness, with anterior aspect open to the nasal cavity. The pituitary infundibulum is displaced superiorly and slightly rightward by these postoperative changes. No discrete nodular enhancement/residual mass is identified. There is no mass effect on the optic chiasm. There may be mild displacement of the prechiasmatic optic nerves, left more so than right.  - another visual field test at the end of 2016 >> significant improvement in vision after surgery. 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 - MRI (09/16/15) perhaps minimal peripheral residual tumor but otherwise a gross resection, mild sphenoid and right maxillary T2-hyperintense mucous retention cysts, and otherwise nicely patent and well-healed sinusotomies.   She had a new MRI 08/2016.  Pituitary labs recently normal, except low TSH:  Component     Latest Ref Rng 10/28/2015  TSH     0.35 - 4.50  uIU/mL 0.07 (L)  T4,Free(Direct)     0.60 - 1.60 ng/dL 1.16  Prolactin      7.1  Cortisol, Plasma      15.0  LH      2.55  FSH      4.9   Prev: Component     Latest Ref Rng 03/28/2015 04/09/2015  Cortisol - AM     6.7 - 22.6 ug/dL 16.7   TSH     0.35 - 4.50 uIU/mL  0.15 (L)  Free T4     0.60 - 1.60 ng/dL  1.77 (H)  T3, Free      2.3 - 4.2 pg/mL  3.4  Cortisol, Plasma       11.5  C206 ACTH     6 - 50 pg/mL  21  Prolactin       5.3  No increased thirst or urination.  09/16/2015: MRI pituitary: showed perhaps minimal peripheral residual tumor but otherwise a gross resection, mild sphenoid and right maxillary T2-hyperintense mucous retention cysts, and otherwise nicely patent and well-healed sinusotomies.   She also has a history of prediabetes, mild HL.  ROS: Constitutional: no weight gain/no weight loss, no fatigue, no subjective hyperthermia, no subjective hypothermia Eyes: no blurry vision, no xerophthalmia ENT: no sore throat, no nodules palpated in throat, no dysphagia, no odynophagia, no hoarseness Cardiovascular: no CP/no SOB/no palpitations/no leg swelling Respiratory: no cough/no SOB/no wheezing Gastrointestinal: no N/no V/no D/no C/no acid reflux Musculoskeletal: no muscle aches/no joint aches Skin: no rashes, no hair loss Neurological: no tremors/no numbness/no tingling/no dizziness  I reviewed pt's medications, allergies, PMH, social hx, family hx, and changes were documented in the history of present illness. Otherwise, unchanged from my initial visit note.   Past Medical History  Diagnosis Date  . Hypothyroidism    . Mood disorder (HCC)     GAD, panic, depression  . History of chicken pox   . Allergic reaction to food     ? shellfish  . Prediabetes   . Mild hyperlipidemia   . Tobacco use    Past Surgical History:  Procedure Laterality Date  . CRANIOTOMY N/A 03/26/2015   Procedure: CRANIOTOMY HYPOPHYSECTOMY TRANSNASAL APPROACH;  Surgeon: Consuella Lose, MD;  Location: [facility];  Service: Neurosurgery;  Laterality: N/A;  . SINUS ENDO W/FUSION N/A 03/26/2015   Procedure: ENDOSCOPIC SINUS SURGERY WITH NAVIGATION;  Surgeon: Ruby Cola, MD;  Location: [facility];  Service: ENT;  Laterality: N/A;   History   Social History  . Marital Status: Single    Spouse Name: N/A  .  Number of Children: 0   Occupational History  . intake speacialists    Social History Main Topics  . Smoking status: Former Smoker -- 1.00 packs/day for 14 years    Types: Cigarettes    Start date: 07/20/2008  . Smokeless tobacco: Not on file  . Alcohol Use: Yes     Comment: wine 2-3 drinks 4-5x a week  . 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 escitalopram (LEXAPRO) 20 MG tablet  Take 20 mg by mouth at bedtime.    Marland Kitchen LORazepam (ATIVAN) 0.5 MG tablet Take 1 tablet (0.5 mg total) by mouth 2 (two) times daily. Only as needed for severe anxiety. Use a little as possible. 15 tablet 0  . nicotine polacrilex (EQ NICOTINE) 4 MG gum Take 4 mg by mouth as needed for smoking cessation.    . Polyvinyl Alcohol-Povidone (REFRESH OP) Place 1 drop into both eyes.    . sodium chloride (OCEAN) 0.65 % SOLN nasal spray Place 2 sprays into both nostrils 4 (four) times daily.    Marland Kitchen SYNTHROID 112 MCG tablet TAKE 1 TABLET(112 MCG) BY MOUTH DAILY BEFORE BREAKFAST 45 tablet 0   No current facility-administered medications on file prior to visit.    Allergies  Allergen Reactions  . Shrimp [Shellfish Allergy]     unknown   Family History  Problem Relation Age of Onset  . Allergic rhinitis Mother   . Hyperlipidemia Mother   . Allergic rhinitis Father   . Prostate cancer Father   . Hyperlipidemia Father   . Heart disease Father 80       CABG  . Heart attack Father 85  . Heart disease Maternal Grandfather   HTN in M and F  PE: BP 110/70 (BP Location: Left Arm, Patient Position: Sitting)   Pulse 83   Ht 5\' 4"  (1.626 m)   Wt 143 lb (64.9 kg)   LMP  10/22/2016   SpO2 96%   BMI 24.55 kg/m  Wt Readings from Last 3 Encounters:  10/31/16 143 lb (64.9 kg)  05/03/16 136 lb 6.4 oz (61.9 kg)  03/24/16 142 lb (64.4 kg)  Constitutional: normalweight, in NAD Eyes: PERRLA, EOMI, no exophthalmos ENT: moist mucous membranes, no thyromegaly, no cervical lymphadenopathy Cardiovascular: RRR, No MRG Respiratory: CTA B Gastrointestinal: abdomen soft, NT, ND, BS+ Musculoskeletal: no deformities, strength intact in all 4 Skin: moist, warm, no rashes Neurological: no tremor with outstretched hands, DTR normal in all 4  ASSESSMENT: 1. Hypothyroidism  01/01/2015: Thyroid U/S:   Right thyroid lobe: 3.3 cm x 0.8 cm x 0.9 cm. Small hypoechoic nodule at the inferior right thyroid measures approximately 3 mm. Heterogeneous appearance of the thyroid tissue  Left thyroid lobe: 3.1 cm x 0.6 cm x 0.8 cm. No nodules. Heterogeneous appearance of the thyroid tissue.  Isthmus Thickness: 3 mm. No nodules visualized.  Lymphadenopathy: None visualized. IMPRESSION: Relatively unremarkable appearance of the thyroid with single small benign-appearing nodule on the right. No left-sided nodules.  2. Pituitary apoplexy  PLAN:  1. Patient with long-standing hypothyroidism, previously on NatureThroid therapy, now on Synthroid d.a.w. She appears euthyroid, except she still has fatigue and mm weakness. She does not appear to have a goiter, thyroid nodules, or neck compression symptoms. - Her TSH decreased after her pituitary apoplexy, after previously being normal on the equivalent dose of Synthroid 125 >> 112 g daily. We discussed that she may have a component of central hypothyroidism over imposed over primary hypothyroidism. It is difficult to differentiate between the 2, however, the treatment is the same, with levothyroxine. Monitoring the treatment is difficult in the setting of central hypothyroidism, because TSH is not usable. For this reason, I advised her to  come back for labs in a.m., fasting, after she skips levothyroxine that day. We will also check thyroid antibodies. - We discussed about correct intake of levothyroxine, fasting, with water, separated by at least 30 minutes from breakfast, and separated by more than 4 hours from  calcium, iron, multivitamins, acid reflux medications (PPIs). She is taking it correctly - I will see her back in 6 months  2. 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 The surgery was performed because of visual loss in right eye. Her visual capacity improved after the surgery. She is now feeling back to baseline especially after starting her anxiety medication. She still has some fatigue, but not increased. 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 No signs of DI. - She had a visual field at the end of last year >> much improved - She has pituitary MRI's by her neurosurgeon, Dr. Kathyrn Sheriff - last MRI in 08/2015 - there were no concerns (there was questionable scar tissue versus recurrence in the sella, however, this was felt to be scar tissue). She will have a repeat MRI in 08/2016. - I will see the patient back in 6 months. We will check her pituitary labs again at that time.  Philemon Kingdom, MD PhD [facility] Endocrinology

## 2016-10-31 NOTE — Patient Instructions (Addendum)
Please continue Levothyroxine 112 mcg daily.  Take the thyroid hormone every day, with water, at least 30 minutes before breakfast, separated by at least 4 hours from: - acid reflux medications - calcium - iron - multivitamins  Please come back for a follow-up appointment in 6 months.  

## 2016-10-31 NOTE — Progress Notes (Signed)
Patient ID: Alyssa Bartlett, female   DOB: Apr 26, 1971, 46 y.o.   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 Last visit 6 mo ago.  Hypothyroidism: Pt. has been dx with hypothyroidism in 2011; started Synthroid, then Levothyroxine >> did not feel good (aches, pains, fatigue) she was seeing [facility] providers and also saw endocrinology before; was on NatureThroid 48.7 mg bid (~160 mcg LT4 daily) >> was feeling better, weight normalized >> however, had increased anxiety - after an allergic rxn (or Niacin flush - was taking a B complex at that time), worse fatigue, insomnia. She is now on Synthroid. Lexapro helped the above sxs.  Pt is on levothyroxine 112 mcg daily, taken: - in am - fasting - at least 60 min from b'fast - no Ca, Fe, MVI, PPIs - not on Biotin  I reviewed pt's thyroid tests - latest free t4 normal (TSH low, but she has a component of central hypothyroidism >> TSH not entirely helpful):  Lab Results  Component Value Date   TSH 0.01 Repeated and verified X2. (L) 10/30/2016   TSH 0.19 (L) 02/24/2016   TSH 0.07 (L) 10/28/2015   TSH 0.15 (L) 04/09/2015   TSH 0.12 (L) 02/18/2015   FREET4 1.26 10/30/2016   FREET4 1.51 02/24/2016   FREET4 1.16 10/28/2015   FREET4 1.77 (H) 04/09/2015   FREET4 2.06 (H) 02/18/2015  09/17/2014: 0.779, TT4 6.3 (4.5-12)  Pt denies: - feeling nodules in neck - hoarseness - dysphagia - choking - SOB with lying down  She has no complaints at this visit. No fatigue, good energy, no weight changes.  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  - MRI (03/26/2015): The pituitary is enlarged with a fluid level evident on T2 weighted  images. Blood products are present in the pituitary compatible with pituitary apoplexy. The gland measures 26 mm cephalo caudad dimension. It measures 19 x 19 mm in cross-sectional dimension. This displaces the optic chiasm superiorly. There is peripheral enhancement about the gland.   - s/p TSR b/c of visual loss (Dr. Kathyrn Sheriff)  - MRI (03/28/2015): Sequelae of recent transsphenoidal pituitary resection are identified. Surgical packing material is present in the posterior nasal cavity. The surgical cavity in the sella/ suprasellar cistern measures approximately 19 x 16 mm and demonstrates uniform peripheral enhancement of 3-4 mm in thickness, with anterior aspect open to the nasal cavity. The pituitary infundibulum is displaced superiorly and slightly rightward by these postoperative changes. No discrete nodular enhancement/residual mass is identified. There is no mass effect on the optic chiasm. There may be mild displacement of the prechiasmatic optic nerves, left more so than right.   Pituitary labs normal, except TSH: Component     Latest Ref Rng 03/28/2015 04/09/2015  Cortisol - AM     6.7 - 22.6 ug/dL 16.7   TSH     0.35 - 4.50 uIU/mL  0.15 (L)  Free T4     0.60 - 1.60 ng/dL  1.77 (H)  T3, Free     2.3 - 4.2 pg/mL  3.4  Cortisol, Plasma       11.5  C206 ACTH     6 - 50 pg/mL  21  Prolactin       5.3  No increased thirst or urination.  - another visual field test at the end of 2016 >> significant improvement in vision after surgery. 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  - MRI (09/16/15) perhaps minimal peripheral residual tumor but otherwise a gross resection, mild sphenoid and right maxillary T2-hyperintense mucous retention cysts, and otherwise nicely patent and well-healed sinusotomies.   Repeated pituitary labs normal, except low TSH: Component     Latest Ref Rng 10/28/2015  TSH     0.35 - 4.50 uIU/mL 0.07 (L)  T4,Free(Direct)     0.60 -  1.60 ng/dL 1.16  Prolactin      7.1  Cortisol, Plasma      15.0  LH      2.55  FSH      4.9   09/16/2015: MRI pituitary: showed perhaps minimal peripheral residual tumor but otherwise a gross resection, mild sphenoid and right maxillary T2-hyperintense mucous retention cysts, and otherwise nicely patent and well-healed sinusotomies.   She had another MRI in 08/2016, which showed stable residual tumor vs scar tissue. She signed a release of information form so I can obtain the CD with the images.  Her vision has recovered entirely after her pituitary apoplexy and subsequent surgery.  She also has a history of prediabetes, mild HL.  ROS: Constitutional: no weight gain/no weight loss, no fatigue, no subjective hyperthermia, no subjective hypothermia Eyes: no blurry vision, no xerophthalmia ENT: no sore throat, no nodules palpated in throat, no dysphagia, no odynophagia, no hoarseness Cardiovascular: no CP/no SOB/no palpitations/no leg swelling Respiratory: no cough/no SOB/no wheezing Gastrointestinal: no N/no V/no D/no C/no acid reflux Musculoskeletal: no muscle aches/no joint aches Skin: no rashes, no hair loss Neurological: no tremors/no numbness/no tingling/no dizziness  I reviewed pt's medications, allergies, PMH, social hx, family hx, and changes were documented in the history of present illness. Otherwise, unchanged from my initial visit note.   Past Medical History  Diagnosis Date  . Hypothyroidism    . Mood disorder (HCC)     GAD, panic, depression  . History of chicken pox   . Allergic reaction to food     ? shellfish  . Prediabetes   . Mild hyperlipidemia   . Tobacco use    Past Surgical History:  Procedure Laterality Date  . CRANIOTOMY N/A 03/26/2015   Procedure: CRANIOTOMY HYPOPHYSECTOMY TRANSNASAL APPROACH;  Surgeon: Consuella Lose, MD;  Location: [facility];  Service: Neurosurgery;  Laterality: N/A;  . SINUS ENDO W/FUSION N/A 03/26/2015   Procedure:  ENDOSCOPIC SINUS SURGERY WITH NAVIGATION;  Surgeon: Ruby Cola, MD;  Location: [facility];  Service: ENT;  Laterality: N/A;   History   Social History  . Marital Status: Single    Spouse Name: N/A  . Number of Children: 0   Occupational History  . intake speacialists    Social History Main Topics  . Smoking status: Former Smoker -- 1.00 packs/day for 14 years    Types: Cigarettes    Start date: 07/20/2008  . Smokeless tobacco: Not on file  . Alcohol Use: Yes     Comment: wine 2-3 drinks 4-5x a week  . 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 escitalopram (LEXAPRO) 20 MG tablet Take 20 mg by mouth at bedtime.    Marland Kitchen LORazepam (ATIVAN) 0.5 MG tablet Take 1 tablet (0.5 mg total) by mouth 2 (two) times daily. Only as needed for severe anxiety. Use a little as possible. 15 tablet 0  . nicotine polacrilex (EQ NICOTINE) 4 MG gum Take 4 mg by mouth as needed for smoking cessation.    . Polyvinyl Alcohol-Povidone (REFRESH OP) Place 1 drop into both eyes.    . sodium chloride (OCEAN) 0.65 % SOLN nasal spray Place 2 sprays into both nostrils 4 (four) times daily.    Marland Kitchen SYNTHROID 112 MCG tablet TAKE 1 TABLET(112 MCG) BY MOUTH DAILY BEFORE BREAKFAST 45 tablet 0   No current facility-administered medications on file prior to visit.    Allergies  Allergen Reactions  . Shrimp [Shellfish Allergy]     unknown   Family History  Problem Relation Age of Onset  . Allergic rhinitis Mother   . Hyperlipidemia Mother   . Allergic rhinitis Father   . Prostate cancer Father   . Hyperlipidemia Father   . Heart disease Father 76       CABG  . Heart  attack Father 16  . Heart disease Maternal Grandfather   HTN in M and F  PE: BP 110/70 (BP Location: Left Arm, Patient Position: Sitting)   Pulse 83   Ht 5\' 4"  (1.626 m)   Wt 143 lb (64.9 kg)   LMP 10/22/2016   SpO2 96%   BMI 24.55 kg/m  Wt Readings from Last 3 Encounters:  10/31/16 143 lb (64.9 kg)  05/03/16 136 lb 6.4 oz (61.9 kg)  03/24/16 142 lb (64.4 kg)  Constitutional: normalweight, in NAD Eyes: PERRLA, EOMI, no exophthalmos ENT: moist mucous membranes, no thyromegaly, no cervical lymphadenopathy Cardiovascular: RRR, No MRG Respiratory: CTA B Gastrointestinal: abdomen soft, NT, ND, BS+ Musculoskeletal: no deformities, strength intact in all 4 Skin: moist, warm, no rashes Neurological: no tremor with outstretched hands, DTR normal in all 4  ASSESSMENT: 1. Hypothyroidism  01/01/2015: Thyroid U/S:   Right thyroid lobe: 3.3 cm x 0.8 cm x 0.9 cm. Small hypoechoic nodule at the inferior right thyroid measures approximately 3 mm. Heterogeneous appearance of the thyroid tissue  Left thyroid lobe: 3.1 cm x 0.6 cm x 0.8 cm. No nodules. Heterogeneous appearance of the thyroid tissue.  Isthmus Thickness: 3 mm. No nodules visualized.  Lymphadenopathy: None visualized. IMPRESSION: Relatively unremarkable appearance of the thyroid with single small benign-appearing nodule on the right. No left-sided nodules.  2. Pituitary apoplexy  PLAN:  1. Patient with long-standing hypothyroidism, previously on nature thyroid therapy, now on Synthroid d.a.w. She has no complaints at this visit and appears euthyroid. No goiter, thyroid nodules or neck compression symptoms. 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 We cannot rely on TSH, since this is usually low in central hypothyroidism. Therefore, she has to have thyroid labs checked in a.m., fasting, after skipping the Synthroid dose that morning. She  had labs checked yesterday and the free T4 and free T3 were normal, with a TSH that was suppressed. I am a little concerned that the TSH was more suppressed compared to previous values, but I will continue to monitor trends.  For now, within the continue her current dose of levothyroxine. - we discussed about taking the thyroid hormone every day, with water, >30 minutes before breakfast, separated by >4 hours from acid reflux medications, calcium, iron, multivitamins. Pt. is taking it correctly    Patient with long-standing hypothyroidism, previously on NatureThroid therapy, now on Synthroid d.a.w. She appears euthyroid, except she still has fatigue and mm weakness. She does not appear to have a goiter, thyroid nodules, or neck compression symptoms. - Her TSH decreased after her pituitary apoplexy, after previously being normal on the equivalent dose of Synthroid 125 >> 112 g daily.  - We discussed that she may have a component of central hypothyroidism over imposed over primary hypothyroidism. It is difficult to differentiate between the 2, however, the treatment is the same, with levothyroxine. Monitoring the treatment is difficult in the setting of central hypothyroidism, because TSH is not usable. For this reason, I advised her to come back for labs in a.m., fasting, after she skips levothyroxine that day. We will also check thyroid antibodies. - We discussed about correct intake of levothyroxine, fasting, with water, separated by at least 30 minutes from breakfast, and separated by more than 4 hours from calcium, iron, multivitamins, acid reflux medications (PPIs). She is taking it correctly - I will see her back in 6 months  2. 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 The surgery was performed because of visual loss in right eye. Her visual capacity improved after the surgery. She is now feeling back to baseline especially after starting her  anxiety medication. She still has some fatigue, but not increased. 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 No signs of DI. - She had a visual field at the end of last year >> much improved - She has pituitary MRI's by her neurosurgeon, Dr. Kathyrn Sheriff - last MRI in 08/2015 - there were no concerns (there was questionable scar tissue versus recurrence in the sella, however, this was felt to be scar tissue). She will have a repeat MRI in 08/2016. - I will see the patient back in 6 months. We will check her pituitary labs again at that time.  Appointment on 10/30/2016  Component Date Value Ref Range Status  . Free T4 10/30/2016 1.26  0.60 - 1.60 ng/dL Final   Comment: Specimens from patients who are undergoing biotin therapy and /or ingesting biotin supplements may contain high levels of biotin.  The higher biotin concentration in these specimens interferes with this Free T4 assay.  Specimens that contain high levels  of biotin may cause false high results for this Free T4 assay.  Please interpret results in light of the total clinical presentation of the patient.    . T3, Free 10/30/2016 3.6  2.3 - 4.2 pg/mL Final  . TSH 10/30/2016 0.01 Repeated and verified X2.* 0.35 - 4.50 uIU/mL Final  . Thyroperoxidase Ab SerPl-aCnc 10/30/2016 1  <9 IU/mL Final  . Thyroglobulin Ab 10/30/2016 <1  <2 IU/mL Final   TSH is still low, as expected. Free T4 and free T3 are normal. Thyroid antibodies are normal. No signs of Hashimoto's thyroiditis. We will continue with her current levothyroxine dose.   Philemon Kingdom, MD PhD [facility]

## 2016-11-02 ENCOUNTER — Ambulatory Visit (INDEPENDENT_AMBULATORY_CARE_PROVIDER_SITE_OTHER): Payer: 59 | Admitting: Psychology

## 2016-11-02 DIAGNOSIS — F410 Panic disorder [episodic paroxysmal anxiety] without agoraphobia: Secondary | ICD-10-CM

## 2016-11-08 IMAGING — MR MR HEAD WO/W CM
15 of 24 series · 29 of 48 positions shown · IV contrast (multihance)
Comparison: 03/26/2015

CLINICAL DATA: Pituitary apoplexy. Transsphenoidal pituitary tumor
resection 03/26/2015.

EXAM:
MRI HEAD WITHOUT AND WITH CONTRAST
TECHNIQUE: Multiplanar, multiecho pulse sequences of the brain and surrounding
structures were obtained without and with intravenous contrast.
CONTRAST:  20mL MULTIHANCE GADOBENATE DIMEGLUMINE 529 MG/ML IV SOLN

[Series 3: FLAIR · sagittal · 5.0mm · 0.47mm/px · 1 of 21 slices shown (1 of 4)]
[im 1/21]
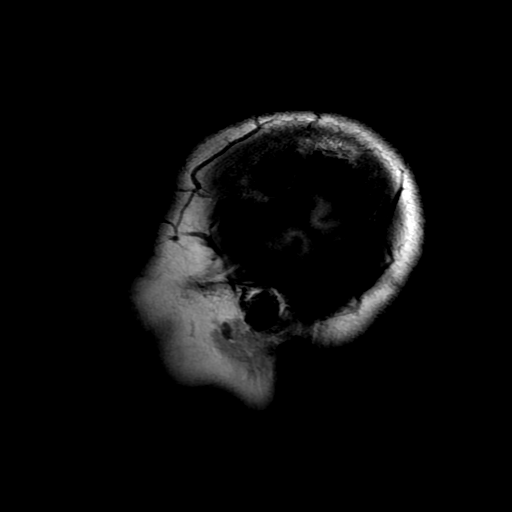

[Series 5: DWI · axial · 3.6mm · 0.94mm/px · z∈[-138,+6]mm · 5 of 82 slices shown (1 of 4)]
[im 1/82]
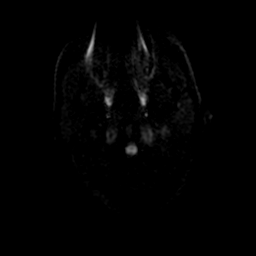
[im 21/82]
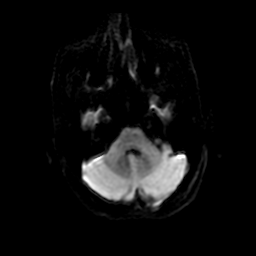
[im 41/82]
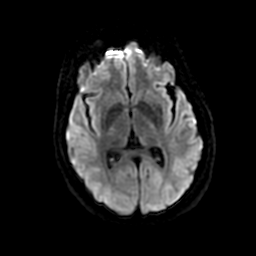
[im 61/82]
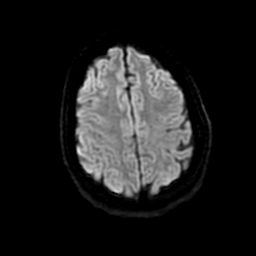
[im 82/82]
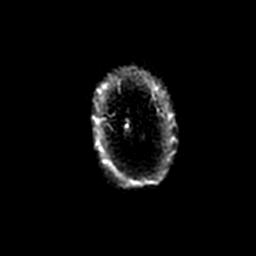

[Series 6: T2 · axial · 5.0mm · 0.47mm/px · 1 of 24 slices shown (1 of 2)]
[im 1/24]
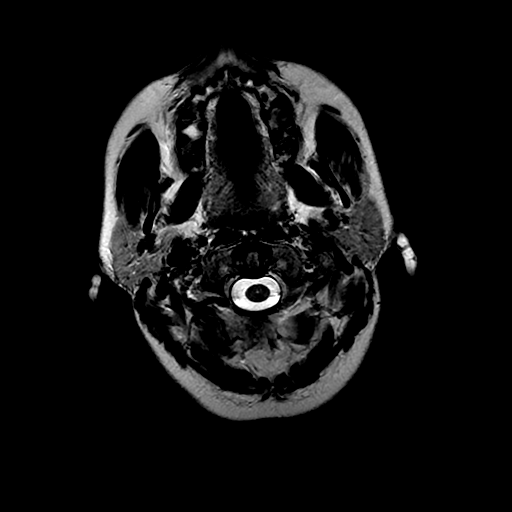

[Series 7: FLAIR · axial · 5.0mm · 0.47mm/px · 1 of 24 slices shown (2 of 4)]
[im 1/24]
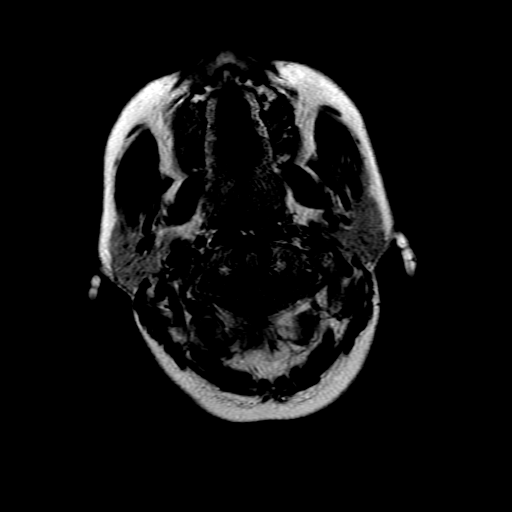

[Series 8: DWI · coronal · 5.0mm · 0.94mm/px · 4 of 72 slices shown (2 of 4)]
[im 1/72]
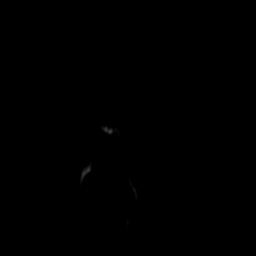
[im 24/72]
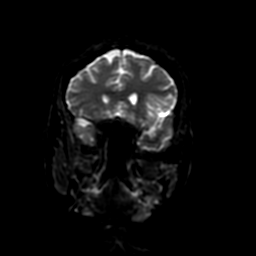
[im 48/72]
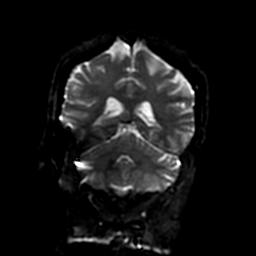
[im 72/72]
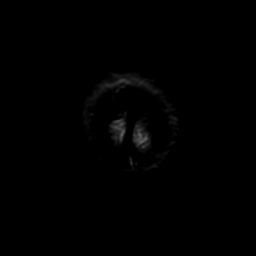

[Series 9: FLAIR · axial · 5.0mm · 0.47mm/px · z∈[-129,+9]mm · 2 of 24 slices shown (3 of 4)]
[im 1/24]
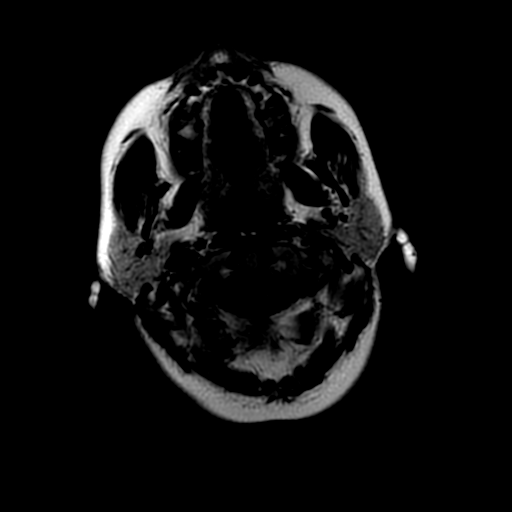
[im 24/24]
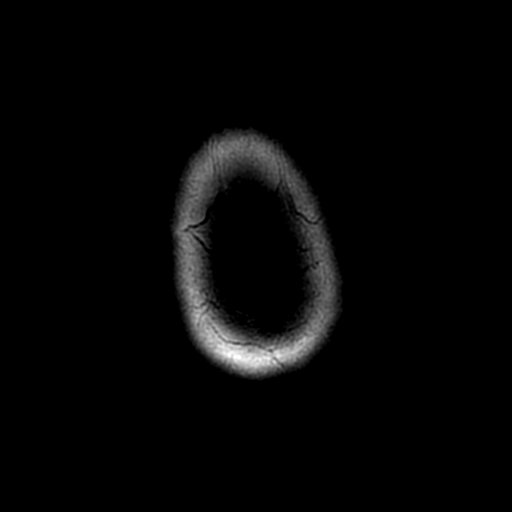

[Series 12: T2 · coronal · 5.0mm · 0.47mm/px · 2 of 28 slices shown (2 of 2)]
[im 1/28]
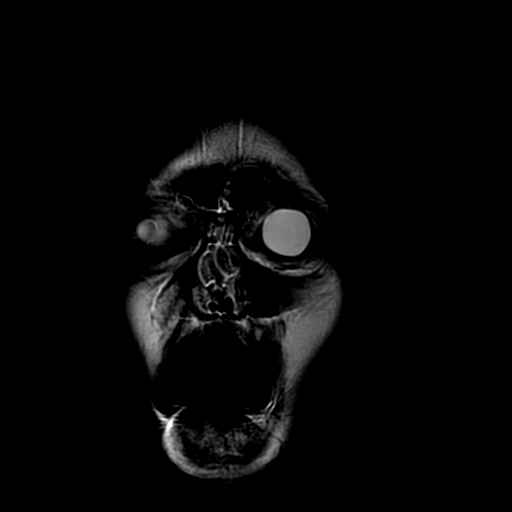
[im 28/28]
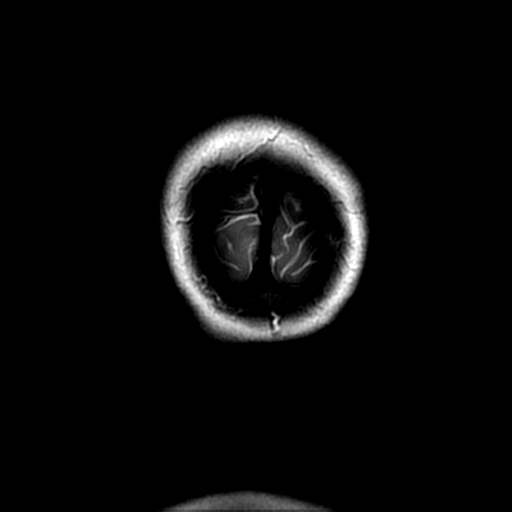

[Series 13: T1 · sagittal · non-contrast · 3.0mm · 0.29mm/px · 1 of 12 slices shown (1 of 4)]
[im 1/12]
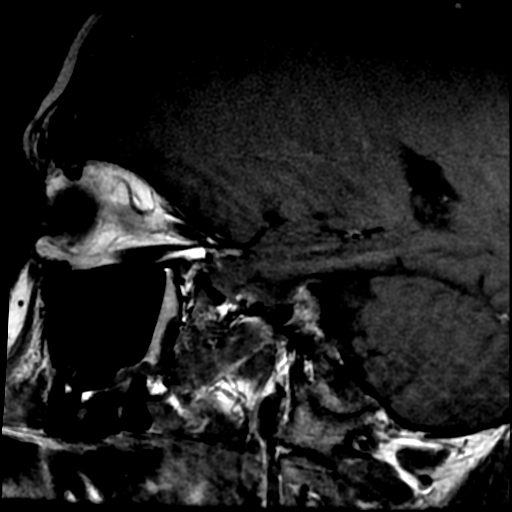

[Series 16: T1 · coronal · non-contrast · 3.0mm · 0.29mm/px · 1 of 12 slices shown (2 of 4)]
[im 1/12]
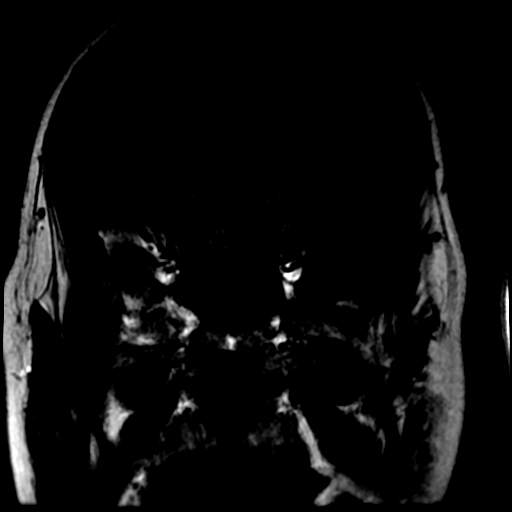

[Series 18: T1 post-contrast · sagittal · 3.0mm · 0.29mm/px · 1 of 12 slices shown]
[im 1/12]
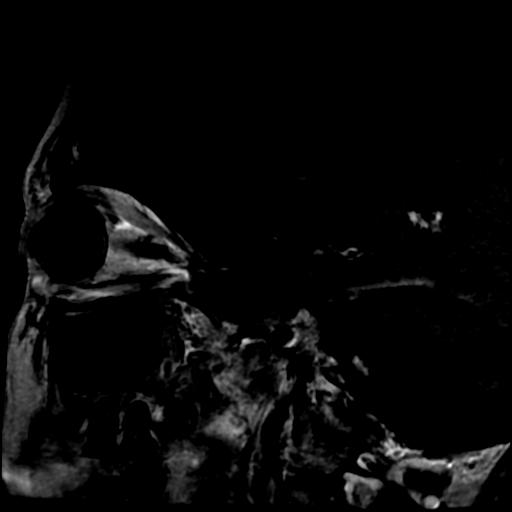

[Series 21: T1 · coronal · 3.0mm · 0.29mm/px · 1 of 12 slices shown (3 of 4)]
[im 1/12]
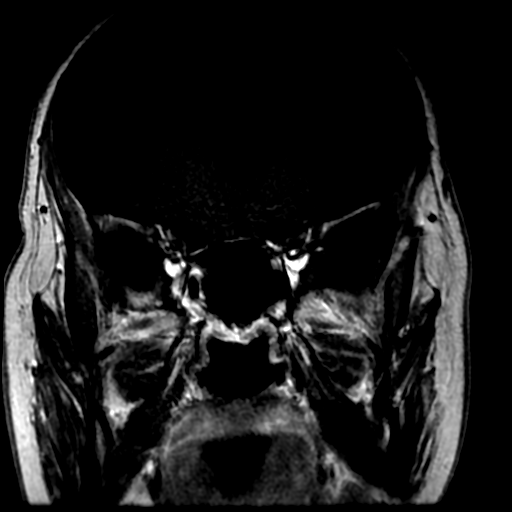

[Series 23: FLAIR · sagittal · 5.0mm · 0.47mm/px · 1 of 21 slices shown (4 of 4)]
[im 1/21]
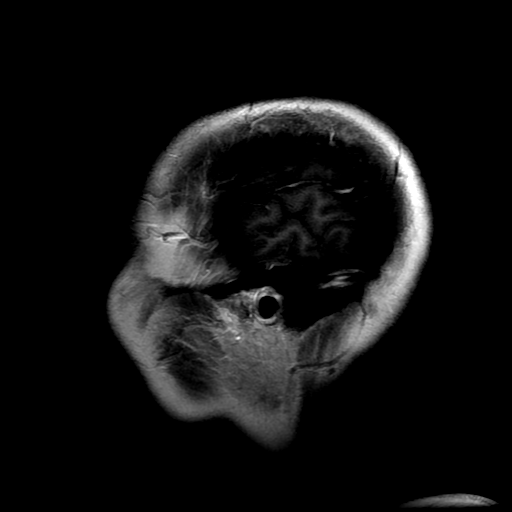

[Series 24: T1 · coronal · 5.0mm · 0.47mm/px · 2 of 28 slices shown (4 of 4)]
[im 1/28]
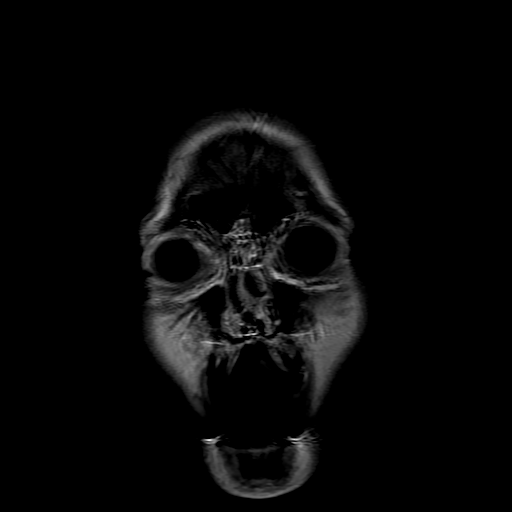
[im 28/28]
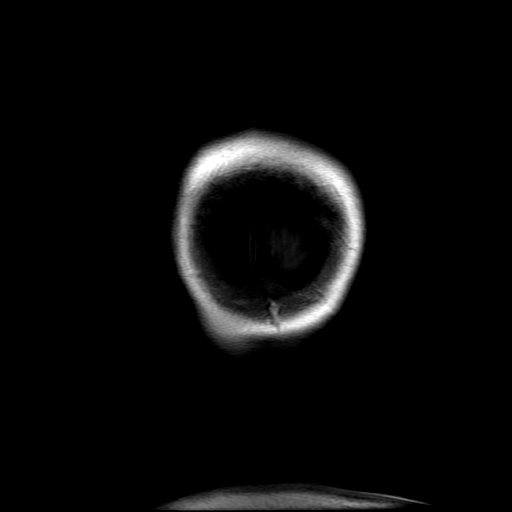

[Series 500: DWI · axial · 3.6mm · 0.94mm/px · z∈[-138,+6]mm · 3 of 41 slices shown (3 of 4)]
[im 1/41]
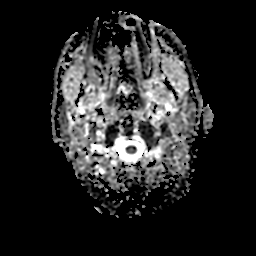
[im 21/41]
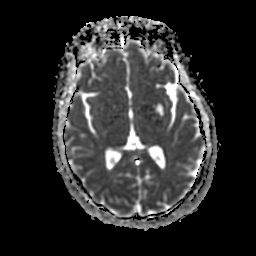
[im 41/41]
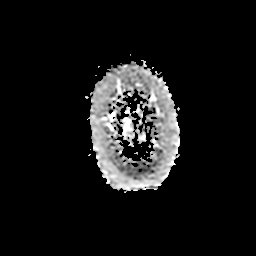

[Series 800: DWI · coronal · 5.0mm · 0.94mm/px · 3 of 36 slices shown (4 of 4)]
[im 1/36]
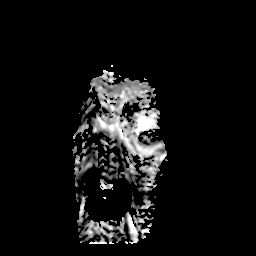
[im 18/36]
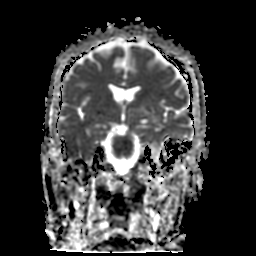
[im 36/36]
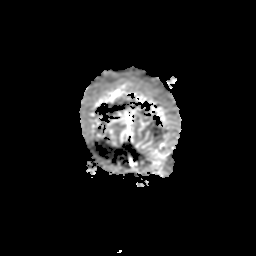

[29 of 48 positions shown; findings below may reference images not displayed]

FINDINGS: Sequelae of recent transsphenoidal pituitary resection are
identified. Surgical packing material is present in the posterior
nasal cavity. The surgical cavity in the sella/ suprasellar cistern
measures approximately 19 x 16 mm and demonstrates uniform
peripheral enhancement of 3-4 mm in thickness, with anterior aspect
open to the nasal cavity. The pituitary infundibulum is displaced
superiorly and slightly rightward by these postoperative changes. No
discrete nodular enhancement/residual mass is identified. There is
no mass effect on the optic chiasm. There may be mild displacement
of the prechiasmatic optic nerves, left more so than right.

There is no evidence of acute infarct, intracranial hemorrhage,
mass, midline shift, or extra-axial fluid collection. Ventricles and
sulci are normal. A dilated perivascular space is again noted at the
inferior aspect of the left lentiform nucleus. Brain parenchyma is
otherwise normal in signal. No abnormal brain parenchymal
enhancement is identified.

Orbits are unremarkable. Mastoid air cells are clear. Major
intracranial vascular flow voids are preserved, with the right
vertebral artery appearing dominant.
IMPRESSION: Postoperative changes from recent transsphenoidal pituitary
resection with surgical cavity as above. No definite residual mass
is identified, and there is no residual mass effect on the optic
chiasm. This will serve as a baseline for future examinations.

## 2016-12-26 ENCOUNTER — Ambulatory Visit (INDEPENDENT_AMBULATORY_CARE_PROVIDER_SITE_OTHER): Payer: 59 | Admitting: Psychology

## 2016-12-26 DIAGNOSIS — F410 Panic disorder [episodic paroxysmal anxiety] without agoraphobia: Secondary | ICD-10-CM | POA: Diagnosis not present

## 2017-03-08 ENCOUNTER — Ambulatory Visit (INDEPENDENT_AMBULATORY_CARE_PROVIDER_SITE_OTHER): Payer: 59 | Admitting: Psychology

## 2017-03-08 ENCOUNTER — Encounter: Payer: Self-pay | Admitting: Family Medicine

## 2017-03-08 DIAGNOSIS — F410 Panic disorder [episodic paroxysmal anxiety] without agoraphobia: Secondary | ICD-10-CM

## 2017-05-03 ENCOUNTER — Ambulatory Visit (INDEPENDENT_AMBULATORY_CARE_PROVIDER_SITE_OTHER): Payer: 59 | Admitting: Internal Medicine

## 2017-05-03 ENCOUNTER — Encounter: Payer: Self-pay | Admitting: Internal Medicine

## 2017-05-03 VITALS — BP 118/78 | HR 103 | Ht 64.0 in | Wt 156.4 lb

## 2017-05-03 DIAGNOSIS — E039 Hypothyroidism, unspecified: Secondary | ICD-10-CM | POA: Diagnosis not present

## 2017-05-03 DIAGNOSIS — E236 Other disorders of pituitary gland: Secondary | ICD-10-CM | POA: Diagnosis not present

## 2017-05-03 NOTE — Progress Notes (Signed)
Patient ID: Alyssa Bartlett, female   DOB: 06-30-1970, 46 y.o.   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 Last visit months ago  Hypothyroidism: Pt. has been dx with hypothyroidism in 2011; started Synthroid, then Levothyroxine >> did not feel good (aches, pains, fatigue) she was seeing [place] [facility] providers and also saw endocrinology before; was on NatureThroid 48.7 mg bid (~160 mcg LT4 daily) >> was feeling better, weight normalized >> however, had increased anxiety - after an allergic rxn (or Niacin flush - was taking a B complex at that time), worse fatigue, insomnia. She is on Synthroid. Lexapro helped the above sxs.  Pt is on levothyroxine 112 mcg daily, taken: - in am - fasting - at least 30 min from b'fast - no Ca, Fe, MVI, PPIs - not on Biotin - missed one dose in last mo  I reviewed pt's thyroid tests - latest free t4 normal (TSH still low, but she has a component of central hypothyroidism >> TSH not entirely helpful):  Lab Results  Component Value Date   TSH 0.01 Repeated and verified X2. 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  - MRI  (03/26/2015): The pituitary is enlarged with a fluid level evident on T2 weighted images. Blood products are present in the pituitary compatible with pituitary apoplexy. The gland measures 26 mm cephalo caudad dimension. It measures 19 x 19 mm in cross-sectional dimension. This displaces the optic chiasm superiorly. There is peripheral enhancement about the gland.   - s/p TSR b/c of visual loss (Dr. Kathyrn Sheriff)  - MRI (03/28/2015): Sequelae of recent transsphenoidal pituitary resection are identified. Surgical packing material is present in the posterior nasal cavity. The surgical cavity in the sella/ suprasellar cistern measures approximately 19 x 16 mm and demonstrates uniform peripheral enhancement of 3-4 mm in thickness, with anterior aspect open to the nasal cavity. The pituitary infundibulum is displaced superiorly and slightly rightward by these postoperative changes. No discrete nodular enhancement/residual mass is identified. There is no mass effect on the optic chiasm. There may be mild displacement of the prechiasmatic optic nerves, left more so than right.   Pituitary labs normal, except low TSH: Component     Latest Ref Rng 03/28/2015 04/09/2015  Cortisol - AM     6.7 - 22.6 ug/dL 16.7   TSH     0.35 - 4.50 uIU/mL  0.15 (L)  Free T4     0.60 - 1.60 ng/dL  1.77 (H)  T3, Free     2.3 - 4.2  pg/mL  3.4  Cortisol, Plasma       11.5  C206 ACTH     6 - 50 pg/mL  21  Prolactin       5.3  No increased thirst or urination.  Visual field test at the end of 2016 >> significant improvement in vision after surgery. 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  09/16/2015: MRI pituitary: showed perhaps minimal peripheral residual tumor but otherwise a gross resection, mild sphenoid and right maxillary T2-hyperintense mucous retention cysts, and otherwise nicely patent and well-healed sinusotomies.   Repeated pituitary labs normal, except low TSH: Component      Latest Ref Rng 10/28/2015  TSH     0.35 - 4.50 uIU/mL 0.07 (L)  T4,Free(Direct)     0.60 - 1.60 ng/dL 1.16  Prolactin      7.1  Cortisol, Plasma      15.0  LH      2.55  FSH      4.9    MRI in 08/2016 showed stable residual tumor vs. scar tissue, but believed to be more c/w scar tissue.  Her vision has recovered entirely after her pituitary apoplexy and subsequent surgery.  She also has a history of prediabetes, mild HL.  ROS: Constitutional: no weight gain/no weight loss, + fatigue, no subjective hyperthermia, no subjective hypothermia Eyes: no blurry vision, no xerophthalmia ENT: no sore throat, no nodules palpated in throat, no dysphagia, no odynophagia, no hoarseness Cardiovascular: no CP/no SOB/no palpitations/no leg swelling Respiratory: no cough/no SOB/no wheezing Gastrointestinal: no N/no V/no D/no C/no acid reflux Musculoskeletal: no muscle aches/no joint aches Skin: no rashes, no hair loss Neurological: no tremors/no numbness/no tingling/no dizziness  I reviewed pt's medications, allergies, PMH, social hx, family hx, and changes were documented in the history of present illness. Otherwise, unchanged from my initial visit note.   Past Medical History  Diagnosis Date  . Hypothyroidism    . Mood disorder (HCC)     GAD, panic, depression  . History of chicken pox   . Allergic reaction to food     ? shellfish  . Prediabetes   . Mild hyperlipidemia   . Tobacco use    Past Surgical History:  Procedure Laterality Date  . CRANIOTOMY N/A 03/26/2015   Procedure: CRANIOTOMY HYPOPHYSECTOMY TRANSNASAL APPROACH;  Surgeon: Consuella Lose, MD;  Location: [facility];  Service: Neurosurgery;  Laterality: N/A;  . SINUS ENDO W/FUSION N/A 03/26/2015   Procedure: ENDOSCOPIC SINUS SURGERY WITH NAVIGATION;  Surgeon: Ruby Cola, MD;  Location: [facility];  Service: ENT;  Laterality: N/A;   History   Social History  . Marital Status: Single    Spouse Name: N/A  .  Number of Children: 0   Occupational History  . intake speacialists    Social History Main Topics  . Smoking status: Former Smoker -- 1.00 packs/day for 14 years    Types: Cigarettes    Start date: 07/20/2008  . Smokeless tobacco: Not on file  . Alcohol Use: Yes     Comment: wine 2-3 drinks 4-5x a week  . 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 escitalopram (LEXAPRO) 20 MG tablet Take 20 mg by mouth at bedtime.    Marland Kitchen LORazepam (ATIVAN) 0.5 MG tablet Take 1 tablet (0.5 mg total) by mouth 2 (two) times daily. Only as needed for severe anxiety. Use a little as possible. 15 tablet 0  . nicotine polacrilex (EQ NICOTINE) 4 MG gum Take 4 mg by mouth as needed for smoking cessation.    . Polyvinyl Alcohol-Povidone (REFRESH OP) Place 1 drop into both eyes.    . sodium chloride (OCEAN) 0.65 % SOLN nasal spray Place 2 sprays into both nostrils 4 (four) times daily.    Marland Kitchen SYNTHROID 112 MCG tablet TAKE 1 TABLET(112 MCG) BY MOUTH DAILY BEFORE BREAKFAST 90 tablet 3   No current facility-administered medications on file prior to visit.    Allergies  Allergen Reactions  . Shrimp [Shellfish Allergy]     unknown   Family History  Problem Relation Age of Onset  . Allergic rhinitis Mother   . Hyperlipidemia Mother   . Allergic rhinitis Father   . Prostate cancer Father   . Hyperlipidemia Father   . Heart disease Father 42       CABG  . Heart attack Father 63  . Heart disease Maternal Grandfather   HTN in M and F  PE: BP 118/78 (BP Location: Left Arm, Patient Position: Sitting, Cuff Size: Normal)   Pulse (!) 103   Ht 5\' 4"  (1.626 m)   Wt 156 lb 6.4  oz (70.9 kg)   SpO2 98%   BMI 26.85 kg/m  Wt Readings from Last 3 Encounters:  05/03/17 156 lb 6.4 oz (70.9 kg)  10/31/16 143 lb (64.9 kg)  05/03/16 136 lb 6.4 oz (61.9 kg)   Constitutional: normal weight, in NAD Eyes: PERRLA, EOMI, no exophthalmos ENT: moist mucous membranes, no thyromegaly, no cervical lymphadenopathy Cardiovascular: tachycardia, RR, No MRG Respiratory: CTA B Gastrointestinal: abdomen soft, NT, ND, BS+ Musculoskeletal: no deformities, strength intact in all 4 Skin: moist, warm, no rashes Neurological: + mild tremor with outstretched hands, DTR normal in all 4  ASSESSMENT: 1. Hypothyroidism  01/01/2015: Thyroid U/S:   Right thyroid lobe: 3.3 cm x 0.8 cm x 0.9 cm. Small hypoechoic nodule at the inferior right thyroid measures approximately 3 mm. Heterogeneous appearance of the thyroid tissue  Left thyroid lobe: 3.1 cm x 0.6 cm x 0.8 cm. No nodules. Heterogeneous appearance of the thyroid tissue.  Isthmus Thickness: 3 mm. No nodules visualized.  Lymphadenopathy: None visualized. IMPRESSION: Relatively unremarkable appearance of the thyroid with single small benign-appearing nodule on the right. No left-sided nodules.  2. H/o Pituitary apoplexy  PLAN:  1. Patient with long-standing hypothyroidism, previously on nature thyroid therapy, now on Synthroid d.a.w. 112 mcg daily. At last visit, we checked her thyroid antibodies and they were not elevated, so no signs of Hashimoto's thyroiditis. - latest thyroid labs reviewed with pt >> normal  - pt feels good on this dose. - we discussed about taking the thyroid hormone every day, with water, >30 minutes before breakfast, separated by >4 hours from acid reflux medications, calcium, iron, multivitamins. Pt. is taking it correctly. - will check thyroid tests today: TSH and fT4 - If labs are abnormal, she will need to return for repeat TFTs in 1.5 months - OTW, RTC in 1 year  2. H/o Pituitary apoplexy - s/p  transsphenoidal resection of pituitary tumor after an episode of hemorrhage.  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  Her visual capacity improved after the surgery and she is asymptomatic now. - Pituitary hormones were checked after the surgery and they were normal, with the exception of a low TSH which could imply central hypothyroidism.  No signs of DI - Visual field greatly improved in 2017 -She is also followed by neurosurgery, Dr. Kathyrn Sheriff - last MRI in 08/2016 (there was stable scar tissue versus recurrence in the sella, but it was felt that it was most likely scar tissue). I reviewed the images of this MRI along with the pm >> small mass in sella - We will recheck her pituitary labs fasting, off Synthroid - in 1-2 days: Orders Placed This Encounter  Procedures  . T4, free  . T3, free  . Cortisol  . ACTH  . Follicle stimulating hormone  . Luteinizing hormone  . Insulin-like growth factor  . Prolactin  . TSH  - I will see the patient back in 1 year  Addendum 06/07/17 Labs still pending...  Philemon Kingdom, MD PhD [facility]

## 2017-05-03 NOTE — Patient Instructions (Addendum)
Please stop at the lab.  Please continue Synthroid 112 mcg daily.  Take the thyroid hormone every day, with water, at least 30 minutes before breakfast, separated by at least 4 hours from: - acid reflux medications - calcium - iron - multivitamins  Please come bck for labs fasting, at 8 am, without taking Synthroid that am.  Please come back for a follow-up appointment in 1 year.

## 2017-05-08 ENCOUNTER — Encounter: Payer: Self-pay | Admitting: Gastroenterology

## 2017-05-24 ENCOUNTER — Ambulatory Visit (INDEPENDENT_AMBULATORY_CARE_PROVIDER_SITE_OTHER): Payer: Self-pay | Admitting: Psychology

## 2017-05-24 DIAGNOSIS — F410 Panic disorder [episodic paroxysmal anxiety] without agoraphobia: Secondary | ICD-10-CM

## 2017-06-28 ENCOUNTER — Encounter: Payer: Self-pay | Admitting: Family Medicine

## 2017-07-10 ENCOUNTER — Ambulatory Visit (INDEPENDENT_AMBULATORY_CARE_PROVIDER_SITE_OTHER): Payer: 59 | Admitting: Gastroenterology

## 2017-07-10 ENCOUNTER — Encounter: Payer: Self-pay | Admitting: Gastroenterology

## 2017-07-10 VITALS — BP 116/84 | HR 76 | Ht 63.5 in | Wt 169.0 lb

## 2017-07-10 DIAGNOSIS — R12 Heartburn: Secondary | ICD-10-CM | POA: Diagnosis not present

## 2017-07-10 DIAGNOSIS — K219 Gastro-esophageal reflux disease without esophagitis: Secondary | ICD-10-CM

## 2017-07-10 DIAGNOSIS — K641 Second degree hemorrhoids: Secondary | ICD-10-CM

## 2017-07-10 DIAGNOSIS — K5909 Other constipation: Secondary | ICD-10-CM

## 2017-07-10 DIAGNOSIS — K625 Hemorrhage of anus and rectum: Secondary | ICD-10-CM

## 2017-07-10 MED ORDER — PEG-KCL-NACL-NASULF-NA ASC-C 140 G PO SOLR: 1.0000 | Freq: Once | ORAL | 0 refills | Status: AC

## 2017-07-10 NOTE — Patient Instructions (Signed)
You have been scheduled for a colonoscopy. Please follow written instructions given to you at your visit today.  Please pick up your prep supplies at the pharmacy within the next 1-3 days. If you use inhalers (even only as needed), please bring them with you on the day of your procedure.   Take Prilosec 20 mg daily before breakfast  Use Benefiber 1 tablespoon three times a day with meals  Use Miralax 1 capful at bedtime as needed

## 2017-07-10 NOTE — Progress Notes (Signed)
Alyssa Bartlett    [id number]    [birth year]  Primary Care Physician:Kim, Nickola Major, DO  Referring Physician: Lucretia Kern, DO [address] [place], [place] [address]  Chief complaint: Blood in stool  HPI: 47 year old female here with complaints of intermittent blood per rectum.  She has had intermittent occasional small-volume bright red blood per rectum for the past 1-2 years, appears to be worse in the past 4-6 months with increased frequency of episodes.  Has also noticed passing clots of blood with pieces of tissue 2-3 times in the last few months.  She has chronic constipation and also thinks has hemorrhoids.  She is taking over-the-counter laxatives on and off as needed.  No family history of IBD or colon cancer.  Father had complicated diverticulitis with resection and colostomy that was reversed later.  Also complains of significant heartburn from throat to upper stomach worse in the past month, constantly clear her throat and feels it is irritated.  Eyes dysphagia, odynophagia, nausea, vomiting, weight loss, melena or abdominal pain. Currently not on any acid suppression medication.  Relevant history includes history of hypothyroidism, anxiety and had surgery for pituitary tumor    Outpatient Encounter Medications as of 07/10/2017  Medication Sig  . escitalopram (LEXAPRO) 20 MG tablet Take 20 mg by mouth at bedtime.  Marland Kitchen LORazepam (ATIVAN) 0.5 MG tablet Take 1 tablet (0.5 mg total) by mouth 2 (two) times daily. Only as needed for severe anxiety. Use a little as possible.  . sodium chloride (OCEAN) 0.65 % SOLN nasal spray Place 1 spray into both nostrils as needed for congestion.  Marland Kitchen SYNTHROID 112 MCG tablet TAKE 1 TABLET(112 MCG) BY MOUTH DAILY BEFORE BREAKFAST  . [DISCONTINUED] nicotine polacrilex (EQ NICOTINE) 4 MG gum Take 4 mg by mouth as needed for smoking cessation.  . [DISCONTINUED] Polyvinyl Alcohol-Povidone (REFRESH OP) Place 1 drop into both eyes.  .  [DISCONTINUED] sodium chloride (OCEAN) 0.65 % SOLN nasal spray Place 2 sprays into both nostrils 4 (four) times daily.   No facility-administered encounter medications on file as of 07/10/2017.     Allergies as of 07/10/2017 - Review Complete 07/10/2017  Allergen Reaction Noted  . Shrimp [shellfish allergy]  09/10/2014    Past Medical History:  Diagnosis Date  . Allergic reaction to food    ? shellfish  . Anxiety   . Hemorrhoid 09/17/2015  . History of chicken pox   . Hypothyroidism    managed by [facility]  . Mild hyperlipidemia   . Mood disorder (HCC)    GAD, panic, depression  . Prediabetes   . Tobacco use     Past Surgical History:  Procedure Laterality Date  . CRANIOTOMY N/A 03/26/2015   Procedure: CRANIOTOMY HYPOPHYSECTOMY TRANSNASAL APPROACH;  Surgeon: Consuella Lose, MD;  Location: [facility];  Service: Neurosurgery;  Laterality: N/A;  . SINUS ENDO W/FUSION N/A 03/26/2015   Procedure: ENDOSCOPIC SINUS SURGERY WITH NAVIGATION;  Surgeon: Ruby Cola, MD;  Location: [facility];  Service: ENT;  Laterality: N/A;    Family History  Problem Relation Age of Onset  . Allergic rhinitis Mother   . Hyperlipidemia Mother   . Diabetes Mother   . Allergic rhinitis Father   . Prostate cancer Father   . Hyperlipidemia Father   . Heart disease Father 47       CABG  . Heart attack Father 56  . Diverticulitis Father  had surgery  . Diabetes Father   . Heart disease Maternal Grandfather   . Alzheimer's disease Maternal Grandfather     Social History   Socioeconomic History  . Marital status: Single    Spouse name: Not on file  . Number of children: 0  . Years of education: Not on file  . Highest education level: Not on file  Social Needs  . Financial resource strain: Not on file  . Food insecurity - worry: Not on file  . Food insecurity - inability: Not on file  . Transportation needs - medical: Not on file  . Transportation needs -  non-medical: Not on file  Occupational History  . Occupation: intake speacialists  Tobacco Use  . Smoking status: Former Smoker    Packs/day: 1.00    Years: 14.00    Pack years: 14.00    Types: Cigarettes    Start date: 07/20/2008    Last attempt to quit: 2011    Years since quitting: 8.0  . Smokeless tobacco: Never Used  . Tobacco comment:  Nicotine gum quit 04/2017  Substance and Sexual Activity  . Alcohol use: Yes    Alcohol/week: 6.0 oz    Types: 10 Glasses of wine per week  . Drug use: No  . Sexual activity: Not on file  Other Topics Concern  . 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  HENT: Negative.   Eyes: Negative for blurred vision.  Respiratory: Negative for cough, shortness of breath and wheezing.   Cardiovascular: Negative for chest pain and palpitations.  Gastrointestinal: as per HPI Genitourinary: Negative for dysuria, urgency, frequency and hematuria.  Musculoskeletal: Negative for myalgias, back pain and joint pain.  Skin: Negative for itching and rash.  Neurological: Negative for dizziness, tremors, focal weakness, seizures and loss of consciousness.  Endo/Heme/Allergies: Positive for seasonal allergies.  Psychiatric/Behavioral: Negative for depression, suicidal ideas and hallucinations.  All other systems reviewed and are negative.   Physical Exam: Vitals:   07/10/17 0908  BP: 116/84  Pulse: 76   Body mass index is 29.47 kg/m. Gen:      No acute distress HEENT:  EOMI, sclera anicteric Neck:     No masses; no thyromegaly Lungs:    Clear to auscultation bilaterally; normal respiratory effort CV:         Regular rate  and rhythm; no murmurs Abd:      + bowel sounds; soft, non-tender; no palpable masses, no distension Ext:    No edema; adequate peripheral perfusion Skin:      Warm and dry; no rash Neuro: alert and oriented x 3 Psych: normal mood and affect Rectal exam: Normal anal sphincter tone, no anal fissure or external hemorrhoids Anoscopy: Small internal hemorrhoids, no active bleeding, normal dentate line, no visible nodules  Data Reviewed:  Reviewed labs, radiology imaging, old records and pertinent past GI work up   Assessment and Plan/Recommendations:  47 year old female here with complaints of intermittent blood per rectum for past 2 years, mostly small volume bright red blood when she wipes but in the past few months  she started noticing clots of blood Scheduled for colonoscopy to evaluate and exclude neoplasia or inflammatory bowel disease  Constipation:  Start MiraLAX 1 capful at bedtime as needed along with Benefiber 1 tablespoon 3 times daily with meals Increase fluid intake to 10-12 , 8oz cups of water daily   Intermittent heartburn recent onset Will do a trial of Prilosec 20 mg daily, 30 minutes before breakfast Antireflux measures and lifestyle modifications in detail If continues to have persistent symptoms with no improvement despite PPI therapy for 2 months, will consider EGD for further evaluation to be scheduled at follow-up visit  Return in 2 months or sooner if needed  K. Denzil Magnuson , MD [phone] Mon-Fri 8a-5p [phone] after 5p, weekends, holidays  CC: Lucretia Kern, DO

## 2017-07-12 ENCOUNTER — Encounter: Payer: Self-pay | Admitting: Gastroenterology

## 2017-07-17 ENCOUNTER — Encounter: Payer: Self-pay | Admitting: Gastroenterology

## 2017-07-18 ENCOUNTER — Telehealth: Payer: Self-pay | Admitting: Gastroenterology

## 2017-07-18 NOTE — Telephone Encounter (Signed)
Called and left message for patient  That a Plenvu prep kit will be out front for patient

## 2017-07-18 NOTE — Telephone Encounter (Signed)
Patient states insurance will not cover prep for colon on Monday 2.4.19 and wants office to try to call something else in. Pt had ov on 1.22.19.

## 2017-07-20 ENCOUNTER — Telehealth: Payer: Self-pay | Admitting: Gastroenterology

## 2017-07-20 NOTE — Telephone Encounter (Signed)
Patient states she has become ill and does not think she can make it this Monday 2.4.19.Marland KitchenMarland Kitchenpt will cb to resch once she looks at work calendar.

## 2017-07-20 NOTE — Telephone Encounter (Signed)
ok 

## 2017-07-23 ENCOUNTER — Encounter: Payer: Self-pay | Admitting: Gastroenterology

## 2017-09-27 ENCOUNTER — Other Ambulatory Visit (INDEPENDENT_AMBULATORY_CARE_PROVIDER_SITE_OTHER): Payer: Self-pay

## 2017-09-27 DIAGNOSIS — E039 Hypothyroidism, unspecified: Secondary | ICD-10-CM

## 2017-09-27 DIAGNOSIS — E236 Other disorders of pituitary gland: Secondary | ICD-10-CM

## 2017-09-27 LAB — T3, FREE: T3, Free: 3.4 pg/mL (ref 2.3–4.2)

## 2017-09-27 LAB — CORTISOL: Cortisol, Plasma: 8.6 ug/dL

## 2017-09-27 LAB — T4, FREE: Free T4: 0.99 ng/dL (ref 0.60–1.60)

## 2017-09-27 LAB — LUTEINIZING HORMONE: LH: 0.96 m[IU]/mL

## 2017-09-27 LAB — TSH: TSH: 0.09 u[IU]/mL — ABNORMAL LOW (ref 0.35–4.50)

## 2017-09-27 LAB — FOLLICLE STIMULATING HORMONE: FSH: 3.4 m[IU]/mL

## 2017-09-28 ENCOUNTER — Telehealth: Payer: Self-pay | Admitting: Internal Medicine

## 2017-09-28 ENCOUNTER — Encounter: Payer: Self-pay | Admitting: Internal Medicine

## 2017-09-28 NOTE — Telephone Encounter (Signed)
Please call patient at ph# [phone] to give her lab results-she has really bad joint pain going on and is wondering if it has to do with her thyroid levels or should she see her PCP

## 2017-10-01 ENCOUNTER — Ambulatory Visit (INDEPENDENT_AMBULATORY_CARE_PROVIDER_SITE_OTHER): Payer: 59 | Admitting: Family Medicine

## 2017-10-01 ENCOUNTER — Encounter: Payer: Self-pay | Admitting: Family Medicine

## 2017-10-01 VITALS — BP 110/80 | HR 97 | Temp 98.6°F | Wt 174.6 lb

## 2017-10-01 DIAGNOSIS — B360 Pityriasis versicolor: Secondary | ICD-10-CM

## 2017-10-01 DIAGNOSIS — M2550 Pain in unspecified joint: Secondary | ICD-10-CM

## 2017-10-01 LAB — PROLACTIN: Prolactin: 8.3 ng/mL

## 2017-10-01 LAB — CBC WITH DIFFERENTIAL/PLATELET
BASOS PCT: 0.6 % (ref 0.0–3.0)
Basophils Absolute: 0 10*3/uL (ref 0.0–0.1)
EOS PCT: 1.8 % (ref 0.0–5.0)
Eosinophils Absolute: 0.1 10*3/uL (ref 0.0–0.7)
HCT: 38.4 % (ref 36.0–46.0)
Hemoglobin: 13.1 g/dL (ref 12.0–15.0)
LYMPHS ABS: 0.9 10*3/uL (ref 0.7–4.0)
Lymphocytes Relative: 13.2 % (ref 12.0–46.0)
MCHC: 34.1 g/dL (ref 30.0–36.0)
MCV: 90.8 fl (ref 78.0–100.0)
MONO ABS: 0.5 10*3/uL (ref 0.1–1.0)
Monocytes Relative: 6.6 % (ref 3.0–12.0)
NEUTROS PCT: 77.8 % — AB (ref 43.0–77.0)
Neutro Abs: 5.6 10*3/uL (ref 1.4–7.7)
Platelets: 296 10*3/uL (ref 150.0–400.0)
RBC: 4.23 Mil/uL (ref 3.87–5.11)
RDW: 13 % (ref 11.5–15.5)
WBC: 7.2 10*3/uL (ref 4.0–10.5)

## 2017-10-01 LAB — SEDIMENTATION RATE: SED RATE: 29 mm/h — AB (ref 0–20)

## 2017-10-01 LAB — INSULIN-LIKE GROWTH FACTOR
IGF-I, LC/MS: 196 ng/mL (ref 52–328)
Z-Score (Female): 0.7 SD (ref ?–2.0)

## 2017-10-01 LAB — C-REACTIVE PROTEIN: CRP: 1.5 mg/dL (ref 0.5–20.0)

## 2017-10-01 LAB — ACTH: C206 ACTH: 28 pg/mL (ref 6–50)

## 2017-10-01 MED ORDER — FLUCONAZOLE 150 MG PO TABS: ORAL_TABLET | ORAL | 2 refills | Status: DC

## 2017-10-01 NOTE — Progress Notes (Addendum)
Subjective:     Patient ID: Alyssa Bartlett, female   DOB: [birth year], 47 y.o.   MRN: [id number]  HPI Patient seen with poly-arthralgias past one and one half weeks. She had sudden onset of symmetric arthralgias involving mostly hands, knees, elbows, wrists, and now feet. She noticed pain and some swelling of MCP joints and to a lesser extent PIP joints. No fevers or chills.   She has skin rash consistent with tenia versicolor which she's had in the past and is interested in treating that. She's had some nonspecific fatigue.  She apparently tried topical selenium-based solution in the past without much success  Denies any recent tick bites. No new medications. Early morning stiffness and symptoms are worse in the morning.  Denies any family history of inflammatory arthritis. She's been under tremendous stress recently. She has a brother in ICU who is quadriplegic from complications of spinal infection. She has medical power of attorney.  Past Medical History:  Diagnosis Date  . Allergic reaction to food    ? shellfish  . Anxiety   . Hemorrhoid 09/17/2015  . History of chicken pox   . Hypothyroidism    managed by [facility]  . Mild hyperlipidemia   . Mood disorder (HCC)    GAD, panic, depression  . Prediabetes   . Tobacco use    Past Surgical History:  Procedure Laterality Date  . CRANIOTOMY N/A 03/26/2015   Procedure: CRANIOTOMY HYPOPHYSECTOMY TRANSNASAL APPROACH;  Surgeon: Consuella Lose, MD;  Location: [facility];  Service: Neurosurgery;  Laterality: N/A;  . SINUS ENDO W/FUSION N/A 03/26/2015   Procedure: ENDOSCOPIC SINUS SURGERY WITH NAVIGATION;  Surgeon: Ruby Cola, MD;  Location: [facility];  Service: ENT;  Laterality: N/A;    reports that she quit smoking about 8 years ago. Her smoking use included cigarettes. She started smoking about 9 years ago. She has a 14.00 pack-year smoking history. She has never used smokeless tobacco. She reports that she  drinks about 6.0 oz of alcohol per week. She reports that she does not use drugs. family history includes Allergic rhinitis in her father and mother; Alzheimer's disease in her maternal grandfather; Diabetes in her father and mother; Diverticulitis in her father; Heart attack (age of onset: 80) in her father; Heart disease in her maternal grandfather; Heart disease (age of onset: 71) in her father; Hyperlipidemia in her father and mother; Prostate cancer in her father. No Active Allergies   Review of Systems  Constitutional: Negative for appetite change, chills, fever and unexpected weight change.  Respiratory: Negative for cough and shortness of breath.   Cardiovascular: Negative for chest pain.  Gastrointestinal: Negative for abdominal pain.  Endocrine: Negative for polydipsia and polyuria.  Genitourinary: Negative for dysuria.  Musculoskeletal: Positive for arthralgias.  Skin: Positive for rash.  Hematological: Negative for adenopathy. Does not bruise/bleed easily.       Objective:   Physical Exam  Constitutional: She appears well-developed and well-nourished.  Neck: Neck supple. No thyromegaly present.  Cardiovascular: Normal rate and regular rhythm. Exam reveals no gallop.  No murmur heard. Pulmonary/Chest: Effort normal and breath sounds normal. No respiratory distress. She has no wheezes. She has no rales.  Musculoskeletal: She exhibits no edema.  Patient may have some very mild swelling MCP joints but no warmth or erythema. No visible swelling of her wrist, elbows, or knees  Lymphadenopathy:    She has no cervical adenopathy.  Skin: Rash noted.  She has scattered slightly hyperpigmented scaly  rash on her trunk consistent with tenia versicolor       Assessment:     #1 polyarthralgias involving multiple joints as above with relatively acute sudden onset and symmetric involement. Rule out inflammatory arthritis  #2 recurrent tinea versicolor    Plan:     -Check further  labs with CBC, antinuclear antibody, sedimentation rate, C-reactive protein, CCP antibody, rheumatoid factor, Lyme antibodies -Consider trial of fluconazole 300 mg 1 dose and then repeat 300 mg 1 dose in one week. She is aware that tinea versicolor tends to recur regardless of treatment.  Eulas Post MD [facility] reviewed with patient. She had mildly elevated sedimentation rate and mildly elevated rheumatoid factor and we explained this is a very nonspecific test. Her C-reactive protein level was normal. CBC normal. CCP antibodies normal. ANA negative.  She still having considerable joint pain. Advil does help slightly. No visible change in swelling. We'll try brief prednisone taper starting at 40 mg daily. We recommend follow-up with Dr. Maudie Mercury if she's not seeing improvement with this and also if she has recurrent arthralgias after finishing the prednisone. Consider rheumatology referral if not improving soon  Eulas Post MD [facility]

## 2017-10-01 NOTE — Patient Instructions (Signed)
Stye A stye is a bump on your eyelid caused by a bacterial infection. A stye can form inside the eyelid (internal stye) or outside the eyelid (external stye). An internal stye may be caused by an infected oil-producing gland inside your eyelid. An external stye may be caused by an infection at the base of your eyelash (hair follicle). Styes are very common. Anyone can get them at any age. They usually occur in just one eye, but you may have more than one in either eye. What are the causes? The infection is almost always caused by bacteria called Staphylococcus aureus. This is a common type of bacteria that lives on your skin. What increases the risk? You may be at higher risk for a stye if you have had one before. You may also be at higher risk if you have:  Diabetes.  Long-term illness.  Long-term eye redness.  A skin condition called seborrhea.  High fat levels in your blood (lipids).  What are the signs or symptoms? Eyelid pain is the most common symptom of a stye. Internal styes are more painful than external styes. Other signs and symptoms may include:  Painful swelling of your eyelid.  A scratchy feeling in your eye.  Tearing and redness of your eye.  Pus draining from the stye.  How is this diagnosed? Your health care provider may be able to diagnose a stye just by examining your eye. The health care provider may also check to make sure:  You do not have a fever or other signs of a more serious infection.  The infection has not spread to other parts of your eye or areas around your eye.  How is this treated? Most styes will clear up in a few days without treatment. In some cases, you may need to use antibiotic drops or ointment to prevent infection. Your health care provider may have to drain the stye surgically if your stye is:  Large.  Causing a lot of pain.  Interfering with your vision.  This can be done using a thin blade or a needle. Follow these  instructions at home:  Take medicines only as directed by your health care provider.  Apply a clean, warm compress to your eye for 10 minutes, 4 times a day.  Do not wear contact lenses or eye makeup until your stye has healed.  Do not try to pop or drain the stye. Contact a health care provider if:  You have chills or a fever.  Your stye does not go away after several days.  Your stye affects your vision.  Your eyeball becomes swollen, red, or painful. This information is not intended to replace advice given to you by your health care provider. Make sure you discuss any questions you have with your health care provider. Document Released: 03/15/2005 Document Revised: 01/30/2016 Document Reviewed: 09/19/2013 Elsevier Interactive Patient Education  [employer].  We will call you with labs done today.     Tinea Versicolor Tinea versicolor is a common fungal infection of the skin. It causes a rash that appears as light or dark patches on the skin. The rash most often occurs on the chest, back, neck, or upper arms. This condition is more common during warm weather. Other than affecting how your skin looks, tinea versicolor usually does not cause other problems. In most cases, the infection goes away in a few weeks with treatment. It may take a few months for the patches on your skin to clear  up. What are the causes? Tinea versicolor occurs when a type of fungus that is normally present on the skin starts to overgrow. This fungus is a kind of yeast. The exact cause of the overgrowth is not known. This condition cannot be passed from one person to another (noncontagious). What increases the risk? This condition is more likely to develop when certain factors are present, such as:  Heat and humidity.  Sweating too much.  Hormone changes.  Oily skin.  A weak defense (immune) system.  What are the signs or symptoms? Symptoms of this condition may include:  A rash on your  skin that is made up of light or dark patches. The rash may have: ? Patches of tan or pink spots on light skin. ? Patches of white or brown spots on dark skin. ? Patches of skin that do not tan. ? Well-marked edges. ? Scales on the discolored areas.  Mild itching.  How is this diagnosed? A health care provider can usually diagnose this condition by looking at your skin. During the exam, he or she may use ultraviolet light to help determine the extent of the infection. In some cases, a skin sample may be taken by scraping the rash. This sample will be viewed under a microscope to check for yeast overgrowth. How is this treated? Treatment for this condition may include:  Dandruff shampoo that is applied to the affected skin during showers or bathing.  Over-the-counter medicated skin cream, lotion, or soaps.  Prescription antifungal medicine in the form of skin cream or pills.  Medicine to help reduce itching.  Follow these instructions at home:  Take medicines only as directed by your health care provider.  Apply dandruff shampoo to the affected area if told to do so by your health care provider. You may be instructed to scrub the affected skin for several minutes each day.  Do not scratch the affected area of skin.  Avoid hot and humid conditions.  Do not use tanning booths.  Try to avoid sweating a lot. Contact a health care provider if:  Your symptoms get worse.  You have a fever.  You have redness, swelling, or pain at the site of your rash.  You have fluid, blood, or pus coming from your rash.  Your rash returns after treatment. This information is not intended to replace advice given to you by your health care provider. Make sure you discuss any questions you have with your health care provider. Document Released: 06/02/2000 Document Revised: 02/06/2016 Document Reviewed: 03/17/2014 Elsevier Interactive Patient Education  2018 [employer].

## 2017-10-01 NOTE — Telephone Encounter (Signed)
Pt received lab results via [place]. Closing encounter.

## 2017-10-03 ENCOUNTER — Encounter: Payer: Self-pay | Admitting: Family Medicine

## 2017-10-03 LAB — B. BURGDORFI ANTIBODIES: B burgdorferi Ab IgG+IgM: <0.9 index

## 2017-10-03 LAB — CYCLIC CITRUL PEPTIDE ANTIBODY, IGG

## 2017-10-03 LAB — RHEUMATOID FACTOR: Rhuematoid fact SerPl-aCnc: 18 IU/mL — ABNORMAL HIGH (ref <14)

## 2017-10-03 LAB — ANA: ANA: NEGATIVE

## 2017-10-03 MED ORDER — PREDNISONE 10 MG PO TABS: ORAL_TABLET | ORAL | 0 refills | Status: DC

## 2017-10-03 NOTE — Addendum Note (Signed)
Addended by: Eulas Post on: 10/03/2017 06:58 PM   Modules accepted: Orders

## 2017-10-17 ENCOUNTER — Telehealth: Payer: Self-pay | Admitting: Family Medicine

## 2017-10-17 DIAGNOSIS — M2550 Pain in unspecified joint: Secondary | ICD-10-CM

## 2017-10-17 NOTE — Telephone Encounter (Signed)
Copied from [place] [id number]. Topic: Quick Communication - See Telephone Encounter >> Oct 17, 2017  8:51 AM Conception Chancy, NT wrote: CRM for notification. See Telephone encounter for: 10/17/17.  Patient was seen on 10/01/17 by Dr. Elease Hashimoto for joint pain all over body. She states she has finished taking predniSONE (DELTASONE) 10 MG tablet and is still experiencing joint pain. She was advised to follow up with Dr. Elease Hashimoto if she was still having pain. Patient would like Dr. Elease Hashimoto to call her in regards to this. States she may need to be referred to a specialist. Please advise.

## 2017-10-17 NOTE — Telephone Encounter (Signed)
I think we should go ahead with rheumatology referral.  See if she has preference.  If not, consider Dr Amil Amen.

## 2017-10-17 NOTE — Telephone Encounter (Signed)
Patient is aware and referral placed 

## 2017-11-02 ENCOUNTER — Other Ambulatory Visit: Payer: Self-pay | Admitting: Internal Medicine

## 2018-02-11 ENCOUNTER — Other Ambulatory Visit: Payer: Self-pay | Admitting: Internal Medicine

## 2018-05-03 ENCOUNTER — Ambulatory Visit: Payer: BLUE CROSS/BLUE SHIELD | Admitting: Internal Medicine

## 2018-05-03 ENCOUNTER — Encounter: Payer: Self-pay | Admitting: Internal Medicine

## 2018-05-03 VITALS — BP 136/82 | HR 87 | Ht 63.5 in | Wt 184.0 lb

## 2018-05-03 DIAGNOSIS — E236 Other disorders of pituitary gland: Secondary | ICD-10-CM | POA: Diagnosis not present

## 2018-05-03 DIAGNOSIS — E039 Hypothyroidism, unspecified: Secondary | ICD-10-CM | POA: Diagnosis not present

## 2018-05-03 LAB — LUTEINIZING HORMONE: LH: 0.76 m[IU]/mL

## 2018-05-03 LAB — CORTISOL: CORTISOL PLASMA: 6.4 ug/dL

## 2018-05-03 LAB — TSH: TSH: 0.4 u[IU]/mL (ref 0.35–4.50)

## 2018-05-03 LAB — FOLLICLE STIMULATING HORMONE: FSH: 3 m[IU]/mL

## 2018-05-03 LAB — T4, FREE: Free T4: 1.05 ng/dL (ref 0.60–1.60)

## 2018-05-03 LAB — T3, FREE: T3, Free: 3.8 pg/mL (ref 2.3–4.2)

## 2018-05-03 NOTE — Patient Instructions (Signed)
Please stop at the lab.  Please continue Synthroid 112 mcg daily.  Take the thyroid hormone every day, with water, at least 30 minutes before breakfast, separated by at least 4 hours from: - acid reflux medications - calcium - iron - multivitamins  Please come back for a follow-up appointment in 1 year. 

## 2018-05-03 NOTE — Progress Notes (Signed)
Patient ID: Alyssa Bartlett, female   DOB: [birth year], 47 y.o.   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 Last visit 1 year ago.  Hypothyroidism:  Reviewed history: Pt. has been dx with hypothyroidism in 2011; started Synthroid, then Levothyroxine >> did not feel good (aches, pains, fatigue) she was seeing [facility] providers and also saw endocrinology before; was on NatureThroid 48.7 mg bid (~160 mcg LT4 daily) >> was feeling better, weight normalized >> however, had increased anxiety - after an allergic rxn (or Niacin flush - was taking a B complex at that time), worse fatigue, insomnia. She is on Synthroid. Lexapro helped the above sxs.  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  - MRI (03/26/2015): The pituitary is enlarged with a fluid level evident on T2 weighted images. Blood products are present in the pituitary compatible with pituitary apoplexy. The gland measures 26 mm cephalo caudad dimension. It measures 19 x 19 mm in cross-sectional dimension. This displaces the optic chiasm superiorly. There is peripheral enhancement about the gland.   - s/p TSR b/c of visual loss (Dr. Kathyrn Sheriff)  - MRI (03/28/2015): Sequelae of recent transsphenoidal pituitary resection are identified. Surgical packing material is present in the posterior nasal cavity. The surgical cavity in the sella/ suprasellar cistern measures approximately 19 x 16 mm and demonstrates uniform peripheral enhancement of 3-4 mm in thickness, with anterior aspect open to the nasal cavity. The pituitary infundibulum is displaced superiorly and slightly rightward by these postoperative changes. No discrete nodular enhancement/residual mass is identified. There is no mass effect on the optic chiasm. There may be mild displacement of the prechiasmatic optic nerves, left more so than right.   Pituitary labs normal, except low TSH: Component     Latest Ref Rng 03/28/2015 04/09/2015  Cortisol - AM     6.7 - 22.6 ug/dL 16.7   TSH     0.35 - 4.50 uIU/mL  0.15 (L)  Free T4     0.60 - 1.60 ng/dL  1.77 (H)  T3, Free     2.3 - 4.2  pg/mL  3.4  Cortisol, Plasma       11.5  C206 ACTH     6 - 50 pg/mL  21  Prolactin       5.3  She denies increased thirst or urination.  Visual field test at the end of 2016 >> significant improvement in vision after surgery. 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  09/16/2015: MRI pituitary: showed perhaps minimal peripheral residual tumor but otherwise a gross resection, mild sphenoid and right maxillary T2-hyperintense mucous retention cysts, and otherwise nicely patent and well-healed sinusotomies.   Repeated pituitary labs normal,  except low TSH: Component     Latest Ref Rng 10/28/2015  TSH     0.35 - 4.50 uIU/mL 0.07 (L)  T4,Free(Direct)     0.60 - 1.60 ng/dL 1.16  Prolactin      7.1  Cortisol, Plasma      15.0  LH      2.55  FSH      4.9    MRI in 08/2016: Stable residual tumor versus scar tissue, but believed to be more consistent with scar tissue.  Repeat pituitary labs obtained 7 months ago: Component     Latest Ref Rng & Units 09/27/2017  IGF-I, LC/MS     52 - 328 ng/mL 196  Z-Score (Female)     -2.0 - 2 SD 0.7  TSH     0.35 - 4.50 uIU/mL 0.09 (L)  Prolactin     ng/mL 8.3  LH     mIU/mL 0.96  FSH     mIU/ML 3.4  C206 ACTH     6 - 50 pg/mL 28  Cortisol, Plasma     ug/dL 8.6  Triiodothyronine,Free,Serum     2.3 - 4.2 pg/mL 3.4  T4,Free(Direct)     0.60 - 1.60 ng/dL 0.99    MRI (09/2017): stable  Her vision recovered entirely after her pituitary apoplexy and subsequent surgery.  She also has a history of prediabetes, latest HbA1c was normal:  Lab Results  Component Value Date   HGBA1C 5.5 03/12/2015   She also has history of mild HL, latest lipid panel was at goal:  Lab Results  Component Value Date   CHOL 163 03/12/2015   HDL 58.30 03/12/2015   LDLCALC 89 03/12/2015   TRIG 79.0 03/12/2015   CHOLHDL 3 03/12/2015    ROS: Constitutional: no weight gain/no weight loss, no fatigue, no subjective hyperthermia, no subjective hypothermia Eyes: no blurry vision, no xerophthalmia ENT: no sore throat, + see HPI Cardiovascular: no CP/no SOB/no palpitations/no leg swelling Respiratory: no cough/no SOB/no wheezing Gastrointestinal: no N/no V/no D/no C/no acid reflux Musculoskeletal: no muscle aches/no joint aches Skin: no rashes, no hair loss Neurological: no tremors/no numbness/no tingling/no dizziness  I reviewed pt's medications, allergies, PMH, social hx, family hx, and changes were documented in the history of present illness. Otherwise, unchanged from my initial visit  note.  Past Medical History  Diagnosis Date  . Hypothyroidism    . Mood disorder (HCC)     GAD, panic, depression  . History of chicken pox   . Allergic reaction to food     ? shellfish  . Prediabetes   . Mild hyperlipidemia   . Tobacco use    Past Surgical History:  Procedure Laterality Date  . CRANIOTOMY N/A 03/26/2015   Procedure: CRANIOTOMY HYPOPHYSECTOMY TRANSNASAL APPROACH;  Surgeon: Consuella Lose, MD;  Location: [facility];  Service: Neurosurgery;  Laterality: N/A;  . SINUS ENDO  W/FUSION N/A 03/26/2015   Procedure: ENDOSCOPIC SINUS SURGERY WITH NAVIGATION;  Surgeon: Ruby Cola, MD;  Location: [facility];  Service: ENT;  Laterality: N/A;   History   Social History  . Marital Status: Single    Spouse Name: N/A  . Number of Children: 0   Occupational History  . intake speacialists    Social History Main Topics  . Smoking status: Former Smoker -- 1.00 packs/day for 14 years    Types: Cigarettes    Start date: 07/20/2008  . Smokeless tobacco: Not on file  . Alcohol Use: Yes     Comment: wine 2-3 drinks 4-5x a week  . 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 escitalopram (LEXAPRO) 20 MG tablet Take 20 mg by mouth at bedtime.    . fluconazole (DIFLUCAN) 150 MG tablet Take two tablets times one dose and then repeat two tablets in one week. 4 tablet 2  . LORazepam (ATIVAN) 0.5 MG tablet Take 1 tablet (0.5 mg total) by mouth 2 (two) times daily. Only as needed for severe anxiety. Use a little as possible. 15 tablet 0  . predniSONE (DELTASONE) 10 MG tablet Taper as follows: 4-4-4-3-3-2-2-1-1 24 tablet 0  . sodium chloride  (OCEAN) 0.65 % SOLN nasal spray Place 1 spray into both nostrils as needed for congestion.    Marland Kitchen SYNTHROID 112 MCG tablet TAKE 1 TABLET(112 MCG) BY MOUTH DAILY BEFORE BREAKFAST 90 tablet 0   No current facility-administered medications on file prior to visit.    No Active Allergies Family History  Problem Relation Age of Onset  . Allergic rhinitis Mother   . Hyperlipidemia Mother   . Diabetes Mother   . Allergic rhinitis Father   . Prostate cancer Father   . Hyperlipidemia Father   . Heart disease Father 29       CABG  . Heart attack Father 25  . Diverticulitis Father        had surgery  . Diabetes Father   . Heart disease Maternal Grandfather   . Alzheimer's disease Maternal Grandfather   HTN in M and F  PE: BP 136/82   Pulse 87   Ht 5' 3.5" (1.613 m) Comment: measured  Wt 184 lb (83.5 kg)   LMP 04/06/2018   SpO2 98%   BMI 32.08 kg/m  Wt Readings from Last 3 Encounters:  05/03/18 184 lb (83.5 kg)  10/01/17 174 lb 9.6 oz (79.2 kg)  07/10/17 169 lb (76.7 kg)   Constitutional: overweight, in NAD Eyes: PERRLA, EOMI, no exophthalmos ENT: moist mucous membranes, no thyromegaly, no cervical lymphadenopathy Cardiovascular: RRR, No MRG Respiratory: CTA B Gastrointestinal: abdomen soft, NT, ND, BS+ Musculoskeletal: no deformities, strength intact in all 4 Skin: moist, warm, no rashes Neurological: no tremor with outstretched hands, DTR normal in all 4  ASSESSMENT: 1. Hypothyroidism  01/01/2015: Thyroid U/S:   Right thyroid lobe: 3.3 cm x 0.8 cm x 0.9 cm. Small hypoechoic nodule at the inferior right thyroid measures approximately 3 mm. Heterogeneous appearance of the thyroid tissue  Left thyroid  lobe: 3.1 cm x 0.6 cm x 0.8 cm. No nodules. Heterogeneous appearance of the thyroid tissue.  Isthmus Thickness: 3 mm. No nodules visualized.  Lymphadenopathy: None visualized. IMPRESSION: Relatively unremarkable appearance of the thyroid with single small benign-appearing  nodule on the right. No left-sided nodules.  2. H/o Pituitary tumor with apoplexy  PLAN:  1. Patient with long-standing hypothyroidism, previously on Nature-Throid, now on Synthroid d.a.w. 112 mcg daily.  We checked her thyroid antibodies in the past and they were not elevated, therefore, no evidence of Hashimoto's thyroiditis. - latest thyroid labs reviewed with pt >> normal in 09/2017 - she continues on LT4  DAW 112 mcg daily - pt feels good on this dose. - we discussed about taking the thyroid hormone every day, with water, >30 minutes before breakfast, separated by >4 hours from acid reflux medications, calcium, iron, multivitamins. Pt. is taking it correctly. - will check thyroid tests today: TSH, fT3 and fT4 - If labs are abnormal, she will need to return for repeat TFTs in 1.5 months - Otherwise, I will see her back in a year  2. H/o Pituitary apoplexy - She is s/p transsphenoidal resection of pituitary tumor after an episode of hemorrhage.  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  Her visual capacity improved to normal after surgery and she is asymptomatic now.  Visual field greatly improved in 2017.  She is followed by neurosurgery, Dr. Kathyrn Sheriff, with last MRI 08/2016.  This showed stable scar tissue versus recurrence in the sella, but it was felt that this was most likely scar tissue. - We checked her pituitary hormones after the surgery and they were normal, except for a low TSH which could imply central hypothyroidism.  No signs of DI.  At last visit, we repeated her pituitary labs (09/2017) they were normal. - We will recheck her pituitary labs fasting today (she did have her LT4 this am) - I will see the patient back in 1 year  Needs to lay down during lab draw, also use smallest needle.   Office Visit on 05/03/2018  Component Date Value Ref Range Status  . TSH 05/03/2018 0.40  0.35 - 4.50 uIU/mL Final  . Prolactin 05/03/2018 9.4  ng/mL Final    Comment:             Reference Range  Females         Non-pregnant        3.0-30.0         Pregnant           10.0-209.0         Postmenopausal      2.0-20.0 . . .   Ladoris Gene 05/03/2018 0.76  mIU/mL Final   Comment: Female Reference Range:20-70 yrs     1.5-9.3 mIU/mL>70 yrs       3.1-35.6 mIU/mLFemale Reference Range:Follicular Phase     4.3-15.4 mIU/mLMidcycle             8.7-76.3 mIU/mLLuteal Phase         0.5-16.9 mIU/mL  Post Menopausal      15.9-54.0  mIU/mLPregnant             <1.5 mIU/mLContraceptives       0.7-5.6 mIU/mL   . [facility] 05/03/2018 3.0  mIU/ML Final   Female Reference Range:  1.4-18.1 mIU/mLFemale Reference Range:Follicular Phase          2.5-10.2 mIU/mLMidCycle Peak  3.4-33.4 mIU/mLLuteal Phase          1.5-9.1 mIU/mLPost Menopausal     23.0-116.3 mIU/mLPregnant          <0.3 mIU/mL  . Free T4 05/03/2018 1.05  0.60 - 1.60 ng/dL Final   Comment: Specimens from patients who are undergoing biotin therapy and /or ingesting biotin supplements may contain high levels of biotin.  The higher biotin concentration in these specimens interferes with this Free T4 assay.  Specimens that contain high levels  of biotin may cause false high results for this Free T4 assay.  Please interpret results in light of the total clinical presentation of the patient.    . T3, Free 05/03/2018 3.8  2.3 - 4.2 pg/mL Final  . Cortisol, Plasma 05/03/2018 6.4  ug/dL Final   AM:  4.3 - 22.4 ug/dLPM:  3.1 - 16.7 ug/dL   All labs are normal.  Philemon Kingdom, MD PhD [facility] Endocrin[facility]

## 2018-05-07 LAB — INSULIN-LIKE GROWTH FACTOR
IGF-I, LC/MS: 147 ng/mL (ref 52–328)
Z-Score (Female): 0.1 SD (ref ?–2.0)

## 2018-05-07 LAB — ACTH: C206 ACTH: 21 pg/mL (ref 6–50)

## 2018-05-07 LAB — PROLACTIN: Prolactin: 9.4 ng/mL

## 2018-05-14 ENCOUNTER — Other Ambulatory Visit: Payer: Self-pay | Admitting: Internal Medicine

## 2018-07-24 DIAGNOSIS — Z23 Encounter for immunization: Secondary | ICD-10-CM | POA: Diagnosis not present

## 2018-08-05 DIAGNOSIS — Z1231 Encounter for screening mammogram for malignant neoplasm of breast: Secondary | ICD-10-CM | POA: Diagnosis not present

## 2018-08-05 DIAGNOSIS — Z6831 Body mass index (BMI) 31.0-31.9, adult: Secondary | ICD-10-CM | POA: Diagnosis not present

## 2018-08-05 DIAGNOSIS — Z01419 Encounter for gynecological examination (general) (routine) without abnormal findings: Secondary | ICD-10-CM | POA: Diagnosis not present

## 2018-08-13 ENCOUNTER — Ambulatory Visit (AMBULATORY_SURGERY_CENTER): Payer: Self-pay

## 2018-08-13 VITALS — Ht 64.0 in | Wt 184.0 lb

## 2018-08-13 DIAGNOSIS — K625 Hemorrhage of anus and rectum: Secondary | ICD-10-CM

## 2018-08-13 MED ORDER — PEG-KCL-NACL-NASULF-NA ASC-C 140 G PO SOLR: 1.0000 | Freq: Once | ORAL | Status: AC

## 2018-08-13 NOTE — Progress Notes (Signed)
Per pt, no allergies to soy or egg products.Pt not taking any weight loss meds or using  O2 at home.  Pt refused emmi video. 

## 2018-08-14 ENCOUNTER — Encounter: Payer: Self-pay | Admitting: Gastroenterology

## 2018-08-20 ENCOUNTER — Encounter: Payer: Self-pay | Admitting: Gastroenterology

## 2018-08-20 ENCOUNTER — Other Ambulatory Visit: Payer: Self-pay | Admitting: Gastroenterology

## 2018-08-20 ENCOUNTER — Ambulatory Visit (AMBULATORY_SURGERY_CENTER): Payer: BLUE CROSS/BLUE SHIELD | Admitting: Gastroenterology

## 2018-08-20 ENCOUNTER — Telehealth: Payer: Self-pay | Admitting: Gastroenterology

## 2018-08-20 VITALS — BP 92/84 | HR 100 | Temp 98.4°F | Resp 13 | Ht 64.0 in | Wt 184.0 lb

## 2018-08-20 DIAGNOSIS — D128 Benign neoplasm of rectum: Secondary | ICD-10-CM

## 2018-08-20 DIAGNOSIS — D127 Benign neoplasm of rectosigmoid junction: Secondary | ICD-10-CM | POA: Diagnosis not present

## 2018-08-20 DIAGNOSIS — D120 Benign neoplasm of cecum: Secondary | ICD-10-CM | POA: Diagnosis not present

## 2018-08-20 DIAGNOSIS — K621 Rectal polyp: Secondary | ICD-10-CM | POA: Diagnosis not present

## 2018-08-20 DIAGNOSIS — K625 Hemorrhage of anus and rectum: Secondary | ICD-10-CM

## 2018-08-20 DIAGNOSIS — D123 Benign neoplasm of transverse colon: Secondary | ICD-10-CM | POA: Diagnosis not present

## 2018-08-20 DIAGNOSIS — D125 Benign neoplasm of sigmoid colon: Secondary | ICD-10-CM

## 2018-08-20 DIAGNOSIS — J690 Pneumonitis due to inhalation of food and vomit: Secondary | ICD-10-CM

## 2018-08-20 DIAGNOSIS — Z1211 Encounter for screening for malignant neoplasm of colon: Secondary | ICD-10-CM | POA: Diagnosis not present

## 2018-08-20 DIAGNOSIS — R509 Fever, unspecified: Secondary | ICD-10-CM

## 2018-08-20 MED ORDER — SODIUM CHLORIDE 0.9 % IV SOLN: 500.0000 mL | Freq: Once | INTRAVENOUS | Status: DC

## 2018-08-20 MED ORDER — AMOXICILLIN-POT CLAVULANATE 875-125 MG PO TABS: 1.0000 | ORAL_TABLET | Freq: Two times a day (BID) | ORAL | 0 refills | Status: DC

## 2018-08-20 NOTE — Progress Notes (Signed)
Pt had 197ml emesis during procedure, suctioned immediately and deep suction with soft cath Sats good in 90's. To PACU pt alert and talking, Pt placed on O@2  Sats 92 on O2 then pt vomitied now sats @88 %. Pt is having some coughing. Encouraged good deep breaths and cough. Pt sounds clear to auscultation. Pt report to be feeling some better after spending a few minutes with her. Dr Havery Moros at bedside going over report. RN will continue to monitor.Marland Kitchentb

## 2018-08-20 NOTE — Progress Notes (Signed)
Patient had some coughing with the procedure and an episode of emesis which was quickly suctioned per anesthesia. Upon recovering from the procedure the patient had significant coughing, suspect she may have aspirated some gastric contents. Monitored for several minutes, given albuterol treatment. She felt much better and had normal oxygen saturation on room air and lungs clear. Given precautions to call with any further respiratory symptoms or development of fever, etc. She agreed and was discharged in stable condition

## 2018-08-20 NOTE — Telephone Encounter (Signed)
Called the patient. She developed a fever this afternoon. She has some intermittent coughing but no shortness of breath or chest pain. She actually feels better now than she did previously when she got home. No abdominal pain, tolerating PO well. As per charting from colonoscopy, she had an episode of vomiting during colonoscopy, with concern for possible aspiration. Breathing improved and oxygen sats normal upon discharge. Discussed options with her. Will give her some Augmentin to take twice daily given concern for aspiration, she will start that now. She should take some tylenol to treat the fever and monitor this evening. If she has any shortness of breath or worsening symptoms overnight she should go to the ED for further evaluation. Otherwise if feeling improved or stable, will recommend she have a CXR at our office in the morning. She can call after office line for advice in the interim. All questions answered, she agreed with the plan.

## 2018-08-20 NOTE — Patient Instructions (Signed)
YOU HAD AN ENDOSCOPIC PROCEDURE TODAY AT [facility]:   Refer to the procedure report that was given to you for any specific questions about what was found during the examination.  If the procedure report does not answer your questions, please call your gastroenterologist to clarify.  If you requested that your care partner not be given the details of your procedure findings, then the procedure report has been included in a sealed envelope for you to review at your convenience later.  YOU SHOULD EXPECT: Some feelings of bloating in the abdomen. Passage of more gas than usual.  Walking can help get rid of the air that was put into your GI tract during the procedure and reduce the bloating. If you had a lower endoscopy (such as a colonoscopy or flexible sigmoidoscopy) you may notice spotting of blood in your stool or on the toilet paper. If you underwent a bowel prep for your procedure, you may not have a normal bowel movement for a few days.  Please Note:  You might notice some irritation and congestion in your nose or some drainage.  This is from the oxygen used during your procedure.  There is no need for concern and it should clear up in a day or so.  SYMPTOMS TO REPORT IMMEDIATELY:   Following lower endoscopy (colonoscopy or flexible sigmoidoscopy):  Excessive amounts of blood in the stool  Significant tenderness or worsening of abdominal pains  Swelling of the abdomen that is new, acute  Fever of 100F or higher   For urgent or emergent issues, a gastroenterologist can be reached at any hour by calling [phone].   DIET:  We do recommend a small meal at first, but then you may proceed to your regular diet.  Drink plenty of fluids but you should avoid alcoholic beverages for 24 hours.  MEDICATIONS: Continue present medications. No Ibuprofen, Naproxen, or other non-steroidal anti-inflammatory meds for 2 weeks after polyp removal.  Please see handouts given to you by  your recovery nurse.  ACTIVITY:  You should plan to take it easy for the rest of today and you should NOT DRIVE or use heavy machinery until tomorrow (because of the sedation medicines used during the test).    FOLLOW UP: Our staff will call the number listed on your records the next business day following your procedure to check on you and address any questions or concerns that you may have regarding the information given to you following your procedure. If we do not reach you, we will leave a message.  However, if you are feeling well and you are not experiencing any problems, there is no need to return our call.  We will assume that you have returned to your regular daily activities without incident.  If any biopsies were taken you will be contacted by phone or by letter within the next 1-3 weeks.  Please call us at [phone] if you have not heard about the biopsies in 3 weeks.   Thank you for allowing [place] to provide for your healthcare needs today.  SIGNATURES/CONFIDENTIALITY: You and/or your care partner have signed paperwork which will be entered into your electronic medical record.  These signatures attest to the fact that that the information above on your After Visit Summary has been reviewed and is understood.  Full responsibility of the confidentiality of this discharge information lies with you and/or your care-partner.

## 2018-08-20 NOTE — Telephone Encounter (Signed)
Pt has a procedure today by Dr. Havery Moros. PT was told to call if she had a fever over 100 and she does. Would like a call back.

## 2018-08-20 NOTE — Progress Notes (Signed)
Patient received to recovery post colonoscopy. Patient reportedly had vomited during the procedure and was suctioned at that time to prevent aspiration. O2 sat 88% on RA upon arrival to recovery, coughing and subsequently vomited an additional small amount. Patient coughing, but denies SOB and doesn't show other signs of respiratory distress. Applied O2 2L [place] without improvement, increased to 4L  with progressive increase in O2 sat to 92%.  Administered Albuterol nebulizer treatment X1 and continued to monitor pt. For response. Prior to discharge, VSS, O2 sat 96% on RA, patient denies symptoms of respiratory distress. Along with routine colonoscopy discharge instructions, I instructed patient to call us if she has fever, increased or productive cough, shortness of breath (due to vomiting and possible risk of aspiration). Advised patient she may have throat/mouth irritation or a small amount of bleeding, and cough due to the suctioning that was done when she vomited, but to call us if it gets worse or doesn't improved. Also try salt water gargles for relief. Patient verbalizes understanding and is much improved prior to discharge.

## 2018-08-20 NOTE — Telephone Encounter (Signed)
Returned patient's call. She had a procedure this am where she had vomited during the procedure and was suctioned (see procedure note). Patient called to tell us she has a fever of 101.5. Her voice is raspy and she still is coughing, but the coughing is not any worse. I informed her that the raspy voice and coughing are to be expected, but the fever is not and I will send this message to Dr. Havery Moros and call her back with further advice.

## 2018-08-20 NOTE — Progress Notes (Signed)
Pt's states no medical or surgical changes since previsit or office visit. 

## 2018-08-20 NOTE — Progress Notes (Signed)
Called to room to assist during endoscopic procedure.  Patient ID and intended procedure confirmed with present staff. Received instructions for my participation in the procedure from the performing physician.  

## 2018-08-20 NOTE — Telephone Encounter (Signed)
Spoke with Dr. Havery Moros regarding my previous message. Dr. Havery Moros is currently seeing patients in clinic and will call her back in between patients for further advisement. Patient verbalizes understanding.

## 2018-08-21 ENCOUNTER — Ambulatory Visit (INDEPENDENT_AMBULATORY_CARE_PROVIDER_SITE_OTHER)
Admission: RE | Admit: 2018-08-21 | Discharge: 2018-08-21 | Disposition: A | Discharge status: Home / Self Care | Payer: BLUE CROSS/BLUE SHIELD | Source: Ambulatory Visit | Attending: Gastroenterology | Admitting: Gastroenterology

## 2018-08-21 ENCOUNTER — Telehealth: Payer: Self-pay | Admitting: Gastroenterology

## 2018-08-21 ENCOUNTER — Telehealth: Payer: Self-pay

## 2018-08-21 DIAGNOSIS — R05 Cough: Secondary | ICD-10-CM | POA: Diagnosis not present

## 2018-08-21 DIAGNOSIS — R509 Fever, unspecified: Secondary | ICD-10-CM | POA: Diagnosis not present

## 2018-08-21 NOTE — Telephone Encounter (Signed)
  Follow up Call-  Call back number 08/20/2018  Post procedure Call Back phone  # [phone]  Permission to leave phone message Yes  Some recent data might be hidden     Patient questions:  Do you have a fever, pain , or abdominal swelling? No. Pain Score  0 *  Have you tolerated food without any problems? Yes.    Have you been able to return to your normal activities? Yes.    Do you have any questions about your discharge instructions: Diet   No. Medications  No. Follow up visit  No.  Do you have questions or concerns about your Care? No.  Actions: * If pain score is 4 or above: No action needed, pain <4.

## 2018-08-21 NOTE — Telephone Encounter (Signed)
Pt inquired about lab results from 3.3.20.

## 2018-08-21 NOTE — Op Note (Addendum)
[place] Patient Name: Alyssa Bartlett Procedure Date: 08/20/2018 9:26 AM MRN: [id number] Endoscopist: Remo Lipps P. Havery Moros , MD Age: 48 Referring MD:  Date of Birth: Jun 07, 1971 Gender: Female Account #: [id number] Procedure:                Colonoscopy Indications:              Rectal bleeding Medicines:                Monitored Anesthesia Care Procedure:                Pre-Anesthesia Assessment:                           - Prior to the procedure, a History and Physical                            was performed, and patient medications and                            allergies were reviewed. The patient's tolerance of                            previous anesthesia was also reviewed. The risks                            and benefits of the procedure and the sedation                            options and risks were discussed with the patient.                            All questions were answered, and informed consent                            was obtained. Prior Anticoagulants: The patient has                            taken no previous anticoagulant or antiplatelet                            agents. ASA Grade Assessment: II - A patient with                            mild systemic disease. After reviewing the risks                            and benefits, the patient was deemed in                            satisfactory condition to undergo the procedure.                           After obtaining informed consent, the colonoscope  was passed under direct vision. Throughout the                            procedure, the patient's blood pressure, pulse, and                            oxygen saturations were monitored continuously. The                            Colonoscope was introduced through the anus and                            advanced to the the cecum, identified by                            appendiceal orifice and ileocecal valve. The                        colonoscopy was performed without difficulty. The                            patient tolerated the procedure well. The quality                            of the bowel preparation was good. The ileocecal                            valve, appendiceal orifice, and rectum were                            photographed. Scope In: 9:31:53 AM Scope Out: 10:11:55 AM Scope Withdrawal Time: 0 hours 32 minutes 26 seconds  Total Procedure Duration: 0 hours 40 minutes 2 seconds  Findings:                 The perianal and digital rectal examinations were                            normal.                           A few small-mouthed diverticula were found in the                            ascending colon.                           A 3 mm polyp was found in the cecum. The polyp was                            flat. The polyp was removed with a cold snare.                            Resection and retrieval were complete.  A 15 mm polyp was found in the transverse colon.                            The polyp was semi-sessile and wrapped around an                            angulated fold, was difficult to gain good position                            for polypectomy. The polyp was removed with a                            piecemeal technique using a cold snare. Resection                            and retrieval were complete. Area just distal to                            the lesion was tattooed with an injection of Spot                            (carbon black).                           Two pedunculated polyps were found in the sigmoid                            colon. The polyps were 10 to 12 mm in size with                            inflammtory changes, suspect these could be the                            cause of bleeding. These polyps were removed with a                            hot snare. Resection and retrieval were complete.                            Four sessile polyps were found in the rectum and                            recto-sigmoid colon. The polyps were 2 to 3 mm in                            size. These polyps were removed with a cold snare.                            Resection and retrieval were complete.                           Internal hemorrhoids were found during retroflexion.  The exam was otherwise without abnormality. Complications:            No immediate complications. Estimated blood loss:                            Minimal. Estimated Blood Loss:     Estimated blood loss was minimal. Impression:               - Diverticulosis in the ascending colon.                           - One 3 mm polyp in the cecum, removed with a cold                            snare. Resected and retrieved.                           - One 15 mm polyp in the transverse colon, removed                            piecemeal using a cold snare. Resected and                            retrieved. Tattooed.                           - Two 10 to 12 mm polyps in the sigmoid colon,                            removed with a hot snare. Resected and retrieved.                           - Four 2 to 3 mm polyps in the rectum and at the                            recto-sigmoid colon, removed with a cold snare.                            Resected and retrieved.                           - Internal hemorrhoids.                           - The examination was otherwise normal.                           Overall, inflamed sigmoid polyps could be the cause                            of the patient's symptoms. Recommendation:           - Patient has a contact number available for  emergencies. The signs and symptoms of potential                            delayed complications were discussed with the                            patient. Return to normal activities tomorrow.                            Written  discharge instructions were provided to the                            patient.                           - Resume previous diet.                           - Continue present medications.                           - Await pathology results.                           - No ibuprofen, naproxen, or other non-steroidal                            anti-inflammatory drugs for 2 weeks after polyp                            removal. Remo Lipps P. Zelda Reames, MD 08/20/2018 10:19:38 AM This report has been signed electronically. Addendum Number: 1   Addendum Date: 08/22/2018 4:26:24 PM      I had addended this procedure note initially after the procedure,       however it did not save. The patient did have an episode of vomiting       gastric contents during the procedure, oropharynx was suctioned quickly       per anesthesia. Upon awakening the patient had some coughing that       persisted and was given an albuterol nebulizer, and took some time to       recover. Following the treatment the patient felt better with normal       oxygen saturation on room air. She was given precautions to call if she       developed fever, shortness of breath, worsening cough. Remo Lipps P. Dustyn Armbrister, MD 08/22/2018 4:30:46 PM This report has been signed electronically.

## 2018-08-22 ENCOUNTER — Encounter: Payer: Self-pay | Admitting: Gastroenterology

## 2018-08-22 ENCOUNTER — Ambulatory Visit (INDEPENDENT_AMBULATORY_CARE_PROVIDER_SITE_OTHER): Payer: BLUE CROSS/BLUE SHIELD | Admitting: Gastroenterology

## 2018-08-22 VITALS — BP 114/82 | HR 74 | Temp 98.8°F | Ht 64.0 in | Wt 181.1 lb

## 2018-08-22 DIAGNOSIS — J690 Pneumonitis due to inhalation of food and vomit: Secondary | ICD-10-CM

## 2018-08-22 DIAGNOSIS — Z86010 Personal history of colonic polyps: Secondary | ICD-10-CM

## 2018-08-22 DIAGNOSIS — R059 Cough, unspecified: Secondary | ICD-10-CM

## 2018-08-22 DIAGNOSIS — R05 Cough: Secondary | ICD-10-CM

## 2018-08-22 MED ORDER — DM-GUAIFENESIN ER 30-600 MG PO TB12: 1.0000 | ORAL_TABLET | Freq: Two times a day (BID) | ORAL | 0 refills | Status: DC | PRN

## 2018-08-22 NOTE — Progress Notes (Addendum)
HPI :  48 y/o female here for an acute visit.  She had a colonoscopy with me on 08/21/18. She had some large polyps removed during her exam which was prolonged. She had an episode of coughing with the procedure and vomited some gastric contents, quickly suctioned by anesthesia and she was stable during the exam. After the procedure she had some ongoing coughing, given albuterol neb and had normal oxygen saturation on room air. Sent home. She developed a fever later that day. I called in augmentin for her to treat suspected aspiration. She had a CXR showing LLF infiltrate yesterday PM.    Overall she's doing better. No further fevers since after the procedure. No chest pain or shortness of breath. She is having some coughing periodically but overall feeling improved. Oxygen sat of 9^% on room air today in the office with stable vitals.  Colonoscopy 08/21/18 - A few small-mouthed diverticula were found in the ascending colon. A 3 mm polyp was found in the cecum. The polyp was flat. The polyp was removed with a cold snare. Resection and retrieval were complete. A 15 mm polyp was found in the transverse colon. The polyp was semi-sessile and wrapped around an angulated fold, was difficult to gain good position for polypectomy. The polyp was removed with a piecemeal technique using a cold snare. Resection and retrieval were complete. Area just distal to the lesion was tattooed with an injection of Spot (carbon black). Two pedunculated polyps were found in the sigmoid colon. The polyps were 10 to 12 mm in size with inflammtory changes, suspect these could be the cause of bleeding. These polyps were removed with a hot snare. Resection and retrieval were complete. Four sessile polyps were found in the rectum and recto-sigmoid colon. The polyps were 2 to 3 mm in size. These polyps were removed with a cold snare. Resection and retrieval were Complete. Internal hemorrhoids were found during retroflexion.  Past Medical  History:  Diagnosis Date  . Allergic reaction to food    ? shellfish  . Anxiety   . GERD (gastroesophageal reflux disease)   . Hemorrhoid 09/17/2015  . History of chicken pox   . Hypothyroidism    managed by [facility]  . Mild hyperlipidemia    no meds  . Mood disorder (HCC)    GAD, panic, depression  . Prediabetes   . Tobacco use      Past Surgical History:  Procedure Laterality Date  . CRANIOTOMY N/A 03/26/2015   Procedure: CRANIOTOMY HYPOPHYSECTOMY TRANSNASAL APPROACH;  Surgeon: Consuella Lose, MD;  Location: [facility];  Service: Neurosurgery;  Laterality: N/A;  . SINUS ENDO W/FUSION N/A 03/26/2015   Procedure: ENDOSCOPIC SINUS SURGERY WITH NAVIGATION;  Surgeon: Ruby Cola, MD;  Location: [facility];  Service: ENT;  Laterality: N/A;   Family History  Problem Relation Age of Onset  . Allergic rhinitis Mother   . Hyperlipidemia Mother   . Diabetes Mother   . Allergic rhinitis Father   . Prostate cancer Father   . Hyperlipidemia Father   . Heart disease Father 21       CABG  . Heart attack Father 44  . Diverticulitis Father        had surgery  . Diabetes Father   . Colon polyps Father   . Heart disease Maternal Grandfather   . Alzheimer's disease Maternal Grandfather   . Colon cancer Neg Hx   . Rectal cancer Neg Hx    Social History  Tobacco Use  . Smoking status: Former Smoker    Packs/day: 1.00    Years: 14.00    Pack years: 14.00    Types: Cigarettes    Last attempt to quit: 2011    Years since quitting: 9.1  . Smokeless tobacco: Never Used  . Tobacco comment:  Nicotine gum quit 04/2017  Substance Use Topics  . Alcohol use: Yes    Alcohol/week: 5.0 - 7.0 standard drinks    Types: 5 - 7 Glasses of wine per week  . Drug use: No   Current Outpatient Medications  Medication Sig Dispense Refill  . amoxicillin-clavulanate (AUGMENTIN) 875-125 MG tablet Take 1 tablet by mouth 2 (two) times daily. 14 tablet 0  . escitalopram  (LEXAPRO) 20 MG tablet Take 20 mg by mouth at bedtime.    Marland Kitchen LORazepam (ATIVAN) 0.5 MG tablet Take 1 tablet (0.5 mg total) by mouth 2 (two) times daily. Only as needed for severe anxiety. Use a little as possible. (Patient taking differently: Take 0.5 mg by mouth 2 (two) times daily. Take whole pill in am and half pill 5 hours later.) 15 tablet 0  . sodium chloride (OCEAN) 0.65 % SOLN nasal spray Place 1 spray into both nostrils as needed for congestion.    Marland Kitchen SYNTHROID 112 MCG tablet TAKE 1 TABLET(112 MCG) BY MOUTH DAILY BEFORE BREAKFAST 90 tablet 0   No current facility-administered medications for this visit.    No Known Allergies   Review of Systems: All systems reviewed and negative except where noted in HPI.    Dg Chest 2 View  Result Date: 08/21/2018 CLINICAL DATA:  Cough and fever today EXAM: CHEST - 2 VIEW COMPARISON:  02/06/2004 FINDINGS: There is left lower lobe hazy airspace disease concerning for pneumonia. There is no pleural effusion or pneumothorax. The heart and mediastinal contours are unremarkable. The osseous structures are unremarkable. IMPRESSION: Left lower lobe pneumonia. Followup PA and lateral chest X-ray is recommended in 3-4 weeks following trial of antibiotic therapy to ensure resolution and exclude underlying malignancy. Electronically Signed   By: Kathreen Devoid   On: 08/21/2018 14:53    Physical Exam: BP 114/82   Pulse 74   Temp 98.8 F (37.1 C)   Ht 5\' 4"  (1.626 m)   Wt 181 lb 2 oz (82.2 kg)   LMP 08/13/2018 (Exact Date)   SpO2 96%   BMI 31.09 kg/m  Constitutional: Pleasant,well-developed, female in no acute distress. HEENT: Normocephalic and atraumatic. Conjunctivae are normal. No scleral icterus. Neck supple.  Cardiovascular: Normal rate, regular rhythm.  Pulmonary/chest: Effort normal and breath sounds normal with some reduced breath sounds at the left base, otherwise clear Abdominal: Soft, nondistended, nontender. There are no masses palpable. No  hepatomegaly. Extremities: no edema Lymphadenopathy: No cervical adenopathy noted. Neurological: Alert and oriented to person place and time. Skin: Skin is warm and dry. No rashes noted. Psychiatric: Normal mood and affect. Behavior is normal.   ASSESSMENT AND PLAN: 48 y/o female here for reassessment of the following issues:  Aspiration pneumonia - unfortunately vomited during colonoscopy with subsequent aspiration despite our best efforts to suction her mouth and airway during the procedure. Fever post procedure and some coughing with Xray showing small LLL infiltrate. I gave her Augmentin night of the procedure, no further fevers, overall improving, has some cough which is intermittent. I counseled her that I think she will continue to improve and resolve this with time. Would continue Augmentin for full 7 days and give her  some Mucinex today for the cough. If she develops fever, shortness of breath, or worsening symptoms moving forward she will contact me. Will plan on a repeat CXR in a few months to ensure resolution. All questions answered  Colon polyps - I think the polyps may be the cause of her rectal bleeding, they were very inflamed. Will await path report and her course following polypectomy.   Fritch Cellar, MD [facility]

## 2018-08-22 NOTE — Patient Instructions (Addendum)
If you are age 48 or older, your body mass index should be between 23-30. Your Body mass index is 31.09 kg/m. If this is out of the aforementioned range listed, please consider follow up with your Primary Care Provider.  If you are age 73 or younger, your body mass index should be between 19-25. Your Body mass index is 31.09 kg/m. If this is out of the aformentioned range listed, please consider follow up with your Primary Care Provider.   We have sent the following medications to your pharmacy for you to pick up at your convenience:  [place]: take every 12 hours as needed  Thank you for entrusting me with your care and for choosing [facility], Dr. McCook Cellar

## 2018-08-22 NOTE — Telephone Encounter (Signed)
Pt was seen today and received results at visit.

## 2018-08-23 ENCOUNTER — Encounter: Payer: Self-pay | Admitting: Gastroenterology

## 2018-09-15 ENCOUNTER — Other Ambulatory Visit: Payer: Self-pay | Admitting: Internal Medicine

## 2018-10-28 ENCOUNTER — Telehealth: Payer: Self-pay

## 2018-10-28 DIAGNOSIS — R059 Cough, unspecified: Secondary | ICD-10-CM

## 2018-10-28 DIAGNOSIS — R05 Cough: Secondary | ICD-10-CM

## 2018-10-28 DIAGNOSIS — J690 Pneumonitis due to inhalation of food and vomit: Secondary | ICD-10-CM

## 2018-10-28 NOTE — Telephone Encounter (Signed)
-----   Message from Roetta Sessions, [place] sent at 08/26/2018  8:47 AM EDT ----- Regarding: FW: recall CXR  ----- Message ----- From: Roetta Sessions, CMA Sent: 08/26/2018   8:40 AM EDT To: Roetta Sessions, CMA Subject: FW: recall CXR                                 Due in early May. DG Chest 2view (cough, fever, pneumonia )   ----- Message ----- From: Yetta Flock, MD Sent: 08/23/2018   2:53 PM EDT To: Roetta Sessions, CMA Subject: recall CXR                                     Recall CXR in 2 months if you can add to the system. Thanks

## 2018-10-28 NOTE — Telephone Encounter (Signed)
Called and LM for pt that she is needing a repeat Chest Xray (cough and pneumonia in March).  Order is in. Pt just needs to go to Radiology in the basement.  No appt is necessary.  They are open right now 8:30am to 4:30pm.

## 2018-10-29 ENCOUNTER — Ambulatory Visit (INDEPENDENT_AMBULATORY_CARE_PROVIDER_SITE_OTHER)
Admission: RE | Admit: 2018-10-29 | Discharge: 2018-10-29 | Disposition: A | Discharge status: Home / Self Care | Payer: BLUE CROSS/BLUE SHIELD | Source: Ambulatory Visit | Attending: Gastroenterology | Admitting: Gastroenterology

## 2018-10-29 ENCOUNTER — Other Ambulatory Visit: Payer: Self-pay

## 2018-10-29 DIAGNOSIS — J690 Pneumonitis due to inhalation of food and vomit: Secondary | ICD-10-CM | POA: Diagnosis not present

## 2018-10-29 DIAGNOSIS — R05 Cough: Secondary | ICD-10-CM

## 2018-10-29 DIAGNOSIS — R059 Cough, unspecified: Secondary | ICD-10-CM

## 2018-10-29 NOTE — Telephone Encounter (Signed)
Called and spoke to pt. She will go to radiology in basement to have chest xray this week.

## 2018-10-30 ENCOUNTER — Telehealth: Payer: Self-pay | Admitting: Gastroenterology

## 2018-10-30 NOTE — Telephone Encounter (Signed)
Called patient and gave CXR results

## 2018-10-30 NOTE — Telephone Encounter (Signed)
Alyssa Bartlett can you please her her know the CXR is normal, prior pneumonia resolved, no further follow up for that is needed. Thanks

## 2018-10-30 NOTE — Telephone Encounter (Signed)
Dr Havery Moros the pt called in regards to her chest xray please advise

## 2018-11-14 ENCOUNTER — Other Ambulatory Visit: Payer: Self-pay | Admitting: Family Medicine

## 2018-12-22 ENCOUNTER — Other Ambulatory Visit: Payer: Self-pay | Admitting: Internal Medicine

## 2019-02-08 ENCOUNTER — Encounter: Payer: Self-pay | Admitting: Gastroenterology

## 2019-02-17 ENCOUNTER — Telehealth: Payer: Self-pay | Admitting: Gastroenterology

## 2019-02-17 NOTE — Telephone Encounter (Signed)
Sure I saw her only once before, ok to switch to Dr.Armbruster if he is fine with it.

## 2019-02-17 NOTE — Telephone Encounter (Signed)
Dr. Havery Moros  Will you accept this patient.

## 2019-02-17 NOTE — Telephone Encounter (Signed)
Dr Silverio Decamp  This patient was originally your patient however on the day of the procedure Dr. Havery Moros filled in for you and did the Procedure. Patient is wanting to stay with Dr.  Havery Moros since  He did her procedure. Is this okay?   Please advise

## 2019-02-17 NOTE — Telephone Encounter (Signed)
Okay with me if okay with Dr. Silverio Decamp

## 2019-03-14 DIAGNOSIS — H52229 Regular astigmatism, unspecified eye: Secondary | ICD-10-CM | POA: Diagnosis not present

## 2019-03-14 DIAGNOSIS — H5210 Myopia, unspecified eye: Secondary | ICD-10-CM | POA: Diagnosis not present

## 2019-03-14 DIAGNOSIS — H524 Presbyopia: Secondary | ICD-10-CM | POA: Diagnosis not present

## 2019-03-25 ENCOUNTER — Ambulatory Visit: Payer: BC Managed Care – PPO | Admitting: Gastroenterology

## 2019-03-25 ENCOUNTER — Encounter: Payer: Self-pay | Admitting: Gastroenterology

## 2019-03-25 VITALS — BP 128/78 | Temp 97.9°F | Ht 64.0 in | Wt 185.6 lb

## 2019-03-25 DIAGNOSIS — Z86010 Personal history of colonic polyps: Secondary | ICD-10-CM | POA: Diagnosis not present

## 2019-03-25 DIAGNOSIS — K219 Gastro-esophageal reflux disease without esophagitis: Secondary | ICD-10-CM

## 2019-03-25 MED ORDER — OMEPRAZOLE 40 MG PO CPDR: 40.0000 mg | DELAYED_RELEASE_CAPSULE | Freq: Every day | ORAL | 3 refills | Status: DC

## 2019-03-25 NOTE — Patient Instructions (Signed)
Call back in November to schedule a colonoscopy in December.   We have sent the following medications to your pharmacy for you to pick up at your convenience: Omeprazole 40 mg daily.

## 2019-03-25 NOTE — Progress Notes (Signed)
HPI :  48 year old female here for follow-up visit.  She had a colonoscopy in March of this year.  She had 8 polyps removed, 4 small polyps in the rectosigmoid colon were hyperplastic.  She otherwise had 3 polyps removed that were more than 10 mm, the largest 15 mm in the transverse colon which was wrapped around a fold that was quite angulated and difficult to remove.  Path shows adenomatous and sessile serrated polyps.  The time I had recommended a follow-up colonoscopy in 6 to 12 months given the technical difficulty removing the transverse colon polyp with piecemeal cold snare.  She has no problems with her bowels that are bothering her at all.  Denies any blood in her stools.  She is generally feeling quite well in regards to lower tract symptoms.  Her main complaint today is reflux.  She has pyrosis, water brash, regurgitation that has been bothering her for the past 7 months or so.  Prior to this year she did not have much symptoms, however in recent months this has been getting progressively worse.  She has frequent symptoms regardless of types of food she eats, after she eats.  She also has symptoms when she lies down at night to sleep.  She has also developed a persistent nonproductive cough for the past several months since the symptoms have been ongoing.  She has a sense of globus at times, but denies any dysphagia.  She denies any family history of esophageal cancer, gastric cancer, or colon cancer.  She has never had a prior endoscopy.  She has not tried taking much for the symptoms yet other than baking soda and water which is helped a little.  Colonoscopy 08/21/18 - A few small-mouthed diverticula were found in the ascending colon. A 3 mm polyp was found in the cecum. The polyp was flat. The polyp was removed with a cold snare. Resection and retrieval were complete. A 15 mm polyp was found in the transverse colon. The polyp was semi-sessile and wrapped around an angulated fold, was difficult  to gain good position for polypectomy. The polyp was removed with a piecemeal technique using a cold snare. Resection and retrieval were complete. Area just distal to the lesion was tattooed with an injection of Spot (carbon black). Two pedunculated polyps were found in the sigmoid colon. The polyps were 10 to 12 mm in size with inflammtory changes, suspect these could be the cause of bleeding. These polyps were removed with a hot snare. Resection and retrieval were complete. Four sessile polyps were found in the rectum and recto-sigmoid colon. The polyps were 2 to 3 mm in size. These polyps were removed with a cold snare. Resection and retrieval were Complete. Internal hemorrhoids were found during retroflexion.  Most polyps adenomas / sessile serrated - with hyperplastic polyps in the rectum / rectosigmoid colon   Past Medical History:  Diagnosis Date  . Allergic reaction to food    ? shellfish  . Anxiety   . GERD (gastroesophageal reflux disease)   . Hemorrhoid 09/17/2015  . History of chicken pox   . Hypothyroidism    managed by [facility]  . Mild hyperlipidemia    no meds  . Mood disorder (HCC)    GAD, panic, depression  . Prediabetes   . Tobacco use      Past Surgical History:  Procedure Laterality Date  . CRANIOTOMY N/A 03/26/2015   Procedure: CRANIOTOMY HYPOPHYSECTOMY TRANSNASAL APPROACH;  Surgeon: Consuella Lose, MD;  Location: [facility];  Service: Neurosurgery;  Laterality: N/A;  . SINUS ENDO W/FUSION N/A 03/26/2015   Procedure: ENDOSCOPIC SINUS SURGERY WITH NAVIGATION;  Surgeon: Ruby Cola, MD;  Location: [facility];  Service: ENT;  Laterality: N/A;   Family History  Problem Relation Age of Onset  . Allergic rhinitis Mother   . Hyperlipidemia Mother   . Diabetes Mother   . Allergic rhinitis Father   . Prostate cancer Father   . Hyperlipidemia Father   . Heart disease Father 83       CABG  . Heart attack Father 50  . Diverticulitis  Father        had surgery  . Diabetes Father   . Colon polyps Father   . Heart disease Maternal Grandfather   . Alzheimer's disease Maternal Grandfather   . Colon cancer Neg Hx   . Rectal cancer Neg Hx    Social History   Tobacco Use  . Smoking status: Former Smoker    Packs/day: 1.00    Years: 14.00    Pack years: 14.00    Types: Cigarettes    Quit date: 2011    Years since quitting: 9.7  . Smokeless tobacco: Never Used  . Tobacco comment:  Nicotine gum quit 04/2017  Substance Use Topics  . Alcohol use: Yes    Alcohol/week: 5.0 - 7.0 standard drinks    Types: 5 - 7 Glasses of wine per week  . Drug use: No   Current Outpatient Medications  Medication Sig Dispense Refill  . escitalopram (LEXAPRO) 20 MG tablet Take 20 mg by mouth at bedtime.    Marland Kitchen LORazepam (ATIVAN) 0.5 MG tablet Take 1 tablet (0.5 mg total) by mouth 2 (two) times daily. Only as needed for severe anxiety. Use a little as possible. (Patient taking differently: Take 0.5 mg by mouth 2 (two) times daily. Take whole pill in am and half pill 5 hours later.) 15 tablet 0  . sodium chloride (OCEAN) 0.65 % SOLN nasal spray Place 1 spray into both nostrils as needed for congestion.    Marland Kitchen SYNTHROID 112 MCG tablet TAKE 1 TABLET(112 MCG) BY MOUTH DAILY BEFORE BREAKFAST 90 tablet 0   No current facility-administered medications for this visit.    No Known Allergies   Review of Systems: All systems reviewed and negative except where noted in HPI.    Lab Results  Component Value Date   WBC 7.2 10/01/2017   HGB 13.1 10/01/2017   HCT 38.4 10/01/2017   MCV 90.8 10/01/2017   PLT 296.0 10/01/2017    Lab Results  Component Value Date   CREATININE 0.72 02/24/2016   BUN 14 02/24/2016   NA 141 02/24/2016   K 4.5 02/24/2016   CL 105 02/24/2016   CO2 30 02/24/2016    Lab Results  Component Value Date   ALT 15 01/09/2009   AST 20 01/09/2009   ALKPHOS 55 01/09/2009   BILITOT 0.6 01/09/2009     Physical Exam: BP  128/78 (BP Location: Left Arm, Patient Position: Sitting)   Temp 97.9 F (36.6 C)   Ht 5\' 4"  (1.626 m)   Wt 185 lb 9.6 oz (84.2 kg)   BMI 31.86 kg/m  Constitutional: Pleasant,well-developed,female in no acute distress. HEENT: Normocephalic and atraumatic. Conjunctivae are normal. No scleral icterus. Neck supple.  Cardiovascular: Normal rate, regular rhythm.  Pulmonary/chest: Effort normal and breath sounds normal. No wheezing, rales or rhonchi. Abdominal: Soft, nondistended, nontender.. There are no masses palpable. No  hepatomegaly. Extremities: no edema Lymphadenopathy: No cervical adenopathy noted. Neurological: Alert and oriented to person place and time. Skin: Skin is warm and dry. No rashes noted. Psychiatric: Normal mood and affect. Behavior is normal.   ASSESSMENT AND PLAN: 48 year old female here for reassessment of the following issues:  GERD - historically she has not had much reflux, however this year and particularly in the past few months she has had very frequent symptoms as described above, including a persistent cough which could be related as well.  No alarm symptoms.  Discussed reflux with her in general, and treatment options including H2 blockers and PPIs.  I discussed risks and benefits of each of these classes with her.  Given her severe symptoms and associated cough, recommend initially treating this with the PPI.  Would recommend omeprazole 40 mg once a day to start for the next 4 weeks and see how she does.  If this resolves her symptoms over this time, she can reduce to 20 mg a day, and then try stopping it or using it as needed.  If this does not improve her symptoms and her reflux persists, we may consider further evaluation with upper endoscopy.  We discussed endoscopy briefly, she will let me know in a few weeks how she is doing on the regimen.  All questions answered, she agreed with the plan.  History of colon polyps - numerous advanced adenomas on the last  colonoscopy, one of which was removed in piecemeal fashion and along a very angulated turn in the colon which was difficult to remove.  I am recommending a colonoscopy when she is amenable in the next few months to ensure no residual polyp at the site.  I discussed risk and benefits of colonoscopy and she wanted to proceed.  She is hoping to do this in December due to work schedule, etc.  I asked her to call us in November to schedule.  She agreed  [place]an Gap Cellar, MD [facility]

## 2019-03-27 NOTE — Progress Notes (Signed)
Please see if this patient wishes to stay with [facility] for primary care and help arrange a TOC if so. Thanks!

## 2019-03-29 ENCOUNTER — Other Ambulatory Visit: Payer: Self-pay | Admitting: Internal Medicine

## 2019-04-21 ENCOUNTER — Telehealth: Payer: Self-pay

## 2019-04-21 ENCOUNTER — Other Ambulatory Visit: Payer: Self-pay

## 2019-04-21 DIAGNOSIS — Z1159 Encounter for screening for other viral diseases: Secondary | ICD-10-CM

## 2019-04-21 NOTE — Telephone Encounter (Signed)
-----   Message from Wyline Beady, [place] sent at 03/25/2019  2:14 PM EDT ----- Patient needs to schedule a colonoscopy in December 2020 per 03-25-2019 office note. She was instructed to call back in November.

## 2019-04-21 NOTE — Telephone Encounter (Signed)
Called and spoke to pt. Scheduled her for colon in the [facility] on 12-15 with [facility] on 12-1 and Covid test on Th, 12-11.

## 2019-04-21 NOTE — Progress Notes (Signed)
Pt scheduled for colon in the [facility] with [facility] on 12-15.  Previsit on 12-1 and Covid test on 12-11.

## 2019-05-07 ENCOUNTER — Other Ambulatory Visit: Payer: Self-pay

## 2019-05-09 ENCOUNTER — Ambulatory Visit: Payer: BC Managed Care – PPO | Admitting: Internal Medicine

## 2019-05-09 ENCOUNTER — Encounter: Payer: Self-pay | Admitting: Internal Medicine

## 2019-05-09 VITALS — BP 130/88 | HR 79 | Ht 64.0 in | Wt 184.0 lb

## 2019-05-09 DIAGNOSIS — E236 Other disorders of pituitary gland: Secondary | ICD-10-CM | POA: Diagnosis not present

## 2019-05-09 DIAGNOSIS — E039 Hypothyroidism, unspecified: Secondary | ICD-10-CM | POA: Diagnosis not present

## 2019-05-09 DIAGNOSIS — Z87898 Personal history of other specified conditions: Secondary | ICD-10-CM

## 2019-05-09 NOTE — Patient Instructions (Addendum)
Please come back for labs in 5-6 weeks. Skip the Synthroid the day of the labs.  Please continue Synthroid 112 mcg daily.  Take the thyroid hormone every day, with water, at least 30 minutes before breakfast, separated by at least 4 hours from: - acid reflux medications - calcium - iron - multivitamins  Move Prilosec at least 4h after Synthroid.  Please come back for a follow-up appointment in 1 year.

## 2019-05-09 NOTE — Progress Notes (Signed)
Patient ID: Alyssa Bartlett, female   DOB: Sep 20, 1970, 48 y.o.   MRN: [id number]  This visit occurred during the SARS-CoV-2 public health emergency.  Safety protocols were in place, including screening questions prior to the visit, additional usage of staff PPE, and extensive cleaning of exam room while observing appropriate contact time as indicated for disinfecting solutions.   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 Last visit 1 year ago.  Hypothyroidism:  Reviewed history: Pt. has been dx with hypothyroidism in 2011; started Synthroid, then Levothyroxine >> did not feel good (aches, pains, fatigue) she was seeing [facility] providers and also saw endocrinology before; was on NatureThroid 48.7 mg bid (~160 mcg LT4 daily) >> was feeling better, weight normalized >> however, had increased anxiety - after an allergic rxn (or Niacin flush - was taking a B complex at that time), worse fatigue, insomnia. She is on Synthroid. Lexapro helped the above sxs.  Patient is on Synthroid 112 mcg daily, taken: - in am - fasting - at least 30 min from b'fast - no Ca, Fe, MVI - + started PPIs (Prilosec) 1 month ago -30-60 min after Synthroid! 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  - MRI (03/26/2015): The pituitary is enlarged with a fluid level evident on T2 weighted images. Blood products are present in the pituitary compatible with pituitary apoplexy. The gland measures 26 mm cephalo caudad dimension. It measures 19 x 19 mm in cross-sectional dimension. This displaces the optic chiasm superiorly. There is peripheral enhancement about the gland.   - s/p TSR b/c of visual loss (Dr. Kathyrn Sheriff)  - MRI (03/28/2015): Sequelae of recent transsphenoidal pituitary resection are identified. Surgical packing material is present in the posterior nasal cavity. The surgical cavity in the sella/ suprasellar cistern measures approximately 19 x 16 mm and demonstrates uniform peripheral enhancement of 3-4 mm in thickness, with anterior aspect open to the nasal cavity. The pituitary infundibulum is displaced superiorly and slightly rightward by these postoperative changes. No discrete nodular enhancement/residual mass is identified. There is no mass effect on the optic chiasm. There may be mild displacement of the prechiasmatic optic nerves, left more so than right.   Pituitary labs were normal with the exception of the pituitary-thyroid axis: Component     Latest Ref Rng 03/28/2015 04/09/2015  Cortisol -  AM     6.7 - 22.6 ug/dL 16.7   TSH     0.35 - 4.50 uIU/mL  0.15 (L)  Free T4     0.60 - 1.60 ng/dL  1.77 (H)  T3, Free     2.3 - 4.2 pg/mL  3.4  Cortisol, Plasma       11.5  C206 ACTH     6 - 50 pg/mL  21  Prolactin       5.3  She denies increased thirst or urination  Visual field test at the end of 2016 >> significant improvement in vision after surgery. 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  09/16/2015: MRI pituitary: showed perhaps minimal peripheral residual tumor but otherwise a gross resection, mild sphenoid and right maxillary T2-hyperintense mucous retention cysts, and otherwise nicely patent and well-healed sinusotomies.   Repeated pituitary labs normal, except low TSH: Component     Latest Ref Rng 10/28/2015  TSH     0.35 - 4.50 uIU/mL 0.07 (L)  T4,Free(Direct)     0.60 - 1.60 ng/dL 1.16  Prolactin      7.1  Cortisol, Plasma      15.0  LH      2.55  FSH      4.9    MRI in 08/2016: Stable residual tumor versus scar tissue, but believed to be more consistent with scar tissue.  Last year, labs were still normal with the exception of the TSH: Component     Latest Ref Rng & Units 09/27/2017  IGF-I, LC/MS     52 - 328 ng/mL 196  Z-Score (Female)     -2.0 - 2 SD 0.7  TSH     0.35 - 4.50 uIU/mL 0.09 (L)  Prolactin     ng/mL 8.3  LH     mIU/mL 0.96  FSH     mIU/ML 3.4  C206 ACTH     6 - 50 pg/mL 28  Cortisol, Plasma     ug/dL 8.6  Triiodothyronine,Free,Serum     2.3 - 4.2 pg/mL 3.4  T4,Free(Direct)     0.60 - 1.60 ng/dL 0.99    MRI (09/2017): stable   At last visit, labs are all normal: Component     Latest Ref Rng & Units 05/03/2018  IGF-I, LC/MS     52 - 328 ng/mL 147  Z-Score (Female)     -2.0 - 2 SD 0.1  TSH     0.35 - 4.50 uIU/mL 0.40  Prolactin     ng/mL 9.4  LH     mIU/mL 0.76  FSH     mIU/ML 3.0  T4,Free(Direct)     0.60 - 1.60 ng/dL 1.05  Triiodothyronine,Free,Serum     2.3 - 4.2 pg/mL 3.8  Cortisol, Plasma     ug/dL 6.4  C206 ACTH     6 - 50 pg/mL 21   Her patient recovered entirely after her pituitary apoplexy and subsequent surgery.  Her menstrual cycles are regular, monthly.  She denies headaches or blurry vision.  She also has a history of prediabetes, with the latest HbA1c normal Lab Results  Component Value Date   HGBA1C 5.5 03/12/2015   She  has a history of mild HL, with the latest lipids at goal: Lab Results  Component Value Date   CHOL 163 03/12/2015   HDL 58.30 03/12/2015   LDLCALC 89 03/12/2015   TRIG 79.0 03/12/2015   CHOLHDL 3 03/12/2015    ROS: Constitutional: no  weight gain/no weight loss, no fatigue, no subjective hyperthermia, no subjective hypothermia Eyes: no blurry vision, no xerophthalmia ENT: no sore throat, + see HPI Cardiovascular: no CP/no SOB/no palpitations/no leg swelling Respiratory: no cough/no SOB/no wheezing Gastrointestinal: no N/no V/no D/no C/no acid reflux Musculoskeletal: no muscle aches/no joint aches Skin: no rashes, no hair loss Neurological: no tremors/no numbness/no tingling/no dizziness  I reviewed pt's medications, allergies, PMH, social hx, family hx, and changes were documented in the history of present illness. Otherwise, unchanged from my initial visit note.  Past Medical History  Diagnosis Date  . Hypothyroidism    . Mood disorder (HCC)     GAD, panic, depression  . History of chicken pox   . Allergic reaction to food     ? shellfish  . Prediabetes   . Mild hyperlipidemia   . Tobacco use    Past Surgical History:  Procedure Laterality Date  . CRANIOTOMY N/A 03/26/2015   Procedure: CRANIOTOMY HYPOPHYSECTOMY TRANSNASAL APPROACH;  Surgeon: Consuella Lose, MD;  Location: [facility];  Service: Neurosurgery;  Laterality: N/A;  . SINUS ENDO W/FUSION N/A 03/26/2015   Procedure: ENDOSCOPIC SINUS SURGERY WITH NAVIGATION;  Surgeon: Ruby Cola, MD;  Location: [facility];  Service: ENT;  Laterality: N/A;   History   Social History  . Marital Status: Single    Spouse Name: N/A  . Number of Children: 0   Occupational History  . intake speacialists    Social History Main Topics  . Smoking status: Former Smoker -- 1.00 packs/day for 14 years    Types: Cigarettes    Start date: 07/20/2008  . Smokeless tobacco: Not on file  . Alcohol Use: Yes     Comment: wine 2-3  drinks 4-5x a week  . 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 escitalopram (LEXAPRO) 20 MG tablet Take 20 mg by mouth at bedtime.    Marland Kitchen LORazepam (ATIVAN) 0.5 MG tablet Take 1 tablet (0.5 mg total) by mouth 2 (two) times daily. Only as needed for severe anxiety. Use a little as possible. (Patient taking differently: Take 0.5 mg by mouth 2 (two) times daily. Take whole pill in am and half pill 5 hours later.) 15 tablet 0  . omeprazole (PRILOSEC) 40 MG capsule Take 1 capsule (40 mg total) by mouth daily. 30 capsule 3  . sodium chloride (OCEAN) 0.65 % SOLN nasal spray Place 1 spray into both nostrils as needed for congestion.    Marland Kitchen SYNTHROID 112 MCG tablet TAKE 1 TABLET(112 MCG) BY MOUTH DAILY BEFORE BREAKFAST 90 tablet 0   No current facility-administered medications on file prior to visit.    No Known Allergies Family History  Problem Relation Age of Onset  . Allergic rhinitis Mother   . Hyperlipidemia Mother   . Diabetes Mother   . Allergic rhinitis Father   . Prostate cancer Father   . Hyperlipidemia Father   . Heart disease Father 15       CABG  . Heart attack Father 31  . Diverticulitis Father  had surgery  . Diabetes Father   . Colon polyps Father   . Heart disease Maternal Grandfather   . Alzheimer's disease Maternal Grandfather   . Colon cancer Neg Hx   . Rectal cancer Neg Hx   HTN in M and F  PE: BP 130/88   Pulse 79   Ht 5\' 4"  (1.626 m)   Wt 184 lb (83.5 kg)   SpO2 97%   BMI 31.58 kg/m  Wt Readings from Last 3 Encounters:  05/09/19 184 lb (83.5 kg)  03/25/19 185 lb 9.6 oz (84.2 kg)  08/22/18 181 lb 2 oz (82.2 kg)    Constitutional: overweight, in NAD Eyes: PERRLA, EOMI, no exophthalmos ENT: moist mucous membranes, no thyromegaly, no cervical lymphadenopathy Cardiovascular: RRR, No MRG Respiratory: CTA B Gastrointestinal: abdomen soft, NT, ND, BS+ Musculoskeletal: no deformities, strength intact in all 4 Skin: moist, warm, no rashes Neurological: no tremor with outstretched hands, DTR normal in all 4  ASSESSMENT: 1. Hypothyroidism  01/01/2015: Thyroid U/S:   Right thyroid lobe: 3.3 cm x 0.8 cm x 0.9 cm. Small hypoechoic nodule at the inferior right thyroid measures approximately 3 mm. Heterogeneous appearance of the thyroid tissue  Left thyroid lobe: 3.1 cm x 0.6 cm x 0.8 cm. No nodules. Heterogeneous appearance of the thyroid tissue.  Isthmus Thickness: 3 mm. No nodules visualized.  Lymphadenopathy: None visualized. IMPRESSION: Relatively unremarkable appearance of the thyroid with single small benign-appearing nodule on the right. No left-sided nodules.  2. H/o Pituitary tumor with apoplexy  PLAN:  1. Patient with longstanding hypothyroidism, on Nature-Throid, now on Synthroid d.a.w. 112 mcg daily.  She had no Hashimoto's thyroiditis per previous thyroid antibody checks. - latest thyroid labs reviewed with pt >> normal in 04/2018 - pt feels good on this dose. - we discussed about taking the thyroid hormone every day, with water, >30 minutes before breakfast, separated by >4 hours from acid reflux medications, calcium, iron, multivitamins. Pt. was taking it correctly at last visit, but she added Prilosec only half an hour to 1 hour after Synthroid and we discussed that it should be at least 4 hours until she takes any acid reflux medicine to allow Synthroid to be absorbed correctly.  We will move Prilosec at lunchtime. - will check thyroid tests in 1.5 months: TSH and fT4 - If labs are abnormal, she will need to return for repeat TFTs in 1.5 months  2. H/o Pituitary apoplexy - She is s/p  transsphenoidal resection of pituitary tumor after an episode of hemorrhage.  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  Her visual capacity is normal after surgery and she is asymptomatic now.  Visual field greatly improved in 2017.  She is followed by neurosurgery, Dr. Kathyrn Sheriff, with last MRI in 09/2017.  This shows stable scar tissue versus recurrence in the sella, but it was felt that this was most likely scar tissue.  Next MRI is planned for 2021. - We checked her pituitary hormones after the surgery and they were normal, except for a low TSH which could imply central hypothyroidism.  No increased thirst or urination to suggest TIA.  At last visit, we reviewed her pituitary labs (04/2018) and they were all normal -We discussed about checking her pituitary labs this year and then skipping 1 year -We will recheck her pituitary labs when she returns 1.5 months -I will see her back in a year  Orders Placed This Encounter  Procedures  . xtpit -  TSH  . xtpit - free T4  . xtpit - FSH  . xtpit - LH  . xtpit - IGF1  . xtpit - cortisol  . xtpit - ACTH  . HgB A1c   Needs to lay down during lab draw, also use smallest needle.   - time spent with the patient: 25 minutes, of which >50% was spent in obtaining information about her symptoms, reviewing her previous labs, evaluations, and treatments, counseling her about her conditions (please see the discussed topics above), and developing a plan to further investigate and treat them; she had a number of questions which I addressed.  Philemon Kingdom, MD PhD [facility]

## 2019-05-13 ENCOUNTER — Other Ambulatory Visit: Payer: Self-pay

## 2019-05-13 DIAGNOSIS — Z20822 Contact with and (suspected) exposure to covid-19: Secondary | ICD-10-CM

## 2019-05-15 LAB — NOVEL CORONAVIRUS, NAA: SARS-CoV-2, NAA: NOT DETECTED

## 2019-05-20 ENCOUNTER — Ambulatory Visit (AMBULATORY_SURGERY_CENTER): Payer: BC Managed Care – PPO | Admitting: *Deleted

## 2019-05-20 ENCOUNTER — Other Ambulatory Visit: Payer: Self-pay

## 2019-05-20 ENCOUNTER — Encounter: Payer: Self-pay | Admitting: Gastroenterology

## 2019-05-20 VITALS — Temp 97.5°F | Ht 64.0 in | Wt 186.0 lb

## 2019-05-20 DIAGNOSIS — Z86010 Personal history of colonic polyps: Secondary | ICD-10-CM

## 2019-05-20 DIAGNOSIS — Z1159 Encounter for screening for other viral diseases: Secondary | ICD-10-CM

## 2019-05-20 MED ORDER — PLENVU 140 G PO SOLR: 1.0000 | ORAL | 0 refills | Status: DC

## 2019-05-20 NOTE — Progress Notes (Signed)
No egg or soy allergy known to patient  No issues with past sedation with any surgeries  or procedures, no intubation problems  No diet pills per patient No home 02 use per patient  No blood thinners per patient  Pt denies issues with constipation - past hx constipation, now improved - pt has BM daily to EOD with soft regular stools  No A fib or A flutter  EMMI video sent to pt's e mail  Pt aspirated at last colon- had aspiration pneumonia that SA treated- may 2020 CXR normal   Pt states was given Plenvu sample in March and requested sample Lot [id number] exp 09/2019 Due to the COVID-19 pandemic we are asking patients to follow these guidelines. Please only bring one care partner. Please be aware that your care partner may wait in the car in the parking lot or if they feel like they will be too hot to wait in the car, they may wait in the lobby on the 4th floor. All care partners are required to wear a mask the entire time (we do not have any that we can provide them), they need to practice social distancing, and we will do a Covid check for all patient's and care partners when you arrive. Also we will check their temperature and your temperature. If the care partner waits in their car they need to stay in the parking lot the entire time and we will call them on their cell phone when the patient is ready for discharge so they can bring the car to the front of the building. Also all patient's will need to wear a mask into building.

## 2019-05-30 ENCOUNTER — Other Ambulatory Visit: Payer: Self-pay | Admitting: Gastroenterology

## 2019-05-30 ENCOUNTER — Ambulatory Visit (INDEPENDENT_AMBULATORY_CARE_PROVIDER_SITE_OTHER): Payer: BC Managed Care – PPO

## 2019-05-30 DIAGNOSIS — Z1159 Encounter for screening for other viral diseases: Secondary | ICD-10-CM | POA: Diagnosis not present

## 2019-05-30 LAB — SARS CORONAVIRUS 2 (TAT 6-24 HRS): SARS Coronavirus 2: NEGATIVE

## 2019-06-03 ENCOUNTER — Ambulatory Visit (AMBULATORY_SURGERY_CENTER): Payer: BC Managed Care – PPO | Admitting: Gastroenterology

## 2019-06-03 ENCOUNTER — Encounter: Payer: Self-pay | Admitting: Gastroenterology

## 2019-06-03 ENCOUNTER — Other Ambulatory Visit: Payer: Self-pay

## 2019-06-03 VITALS — BP 127/86 | HR 86 | Temp 98.6°F | Resp 12 | Ht 64.0 in | Wt 186.0 lb

## 2019-06-03 DIAGNOSIS — Z1211 Encounter for screening for malignant neoplasm of colon: Secondary | ICD-10-CM | POA: Diagnosis not present

## 2019-06-03 DIAGNOSIS — Z86010 Personal history of colonic polyps: Secondary | ICD-10-CM

## 2019-06-03 DIAGNOSIS — D123 Benign neoplasm of transverse colon: Secondary | ICD-10-CM | POA: Diagnosis not present

## 2019-06-03 MED ORDER — SODIUM CHLORIDE 0.9 % IV SOLN: 500.0000 mL | Freq: Once | INTRAVENOUS | Status: DC

## 2019-06-03 NOTE — Op Note (Signed)
[facility] Patient Name: Alyssa Bartlett Procedure Date: 06/03/2019 7:55 AM MRN: [id number] Endoscopist: Remo Lipps P. Havery Moros , MD Age: 48 Referring MD:  Date of Birth: [birth year] Gender: Female Account #: [id number] Procedure:                Colonoscopy Indications:              High risk colon cancer surveillance: Personal                            history of colonic polyps - challenging piecemeal                            polypectomy removed in transverse colon along a                            fold in very angulated position March 2020 Medicines:                Monitored Anesthesia Care Procedure:                Pre-Anesthesia Assessment:                           - Prior to the procedure, a History and Physical                            was performed, and patient medications and                            allergies were reviewed. The patient's tolerance of                            previous anesthesia was also reviewed. The risks                            and benefits of the procedure and the sedation                            options and risks were discussed with the patient.                            All questions were answered, and informed consent                            was obtained. Prior Anticoagulants: The patient has                            taken no previous anticoagulant or antiplatelet                            agents. ASA Grade Assessment: II - A patient with                            mild systemic disease. After reviewing the risks  and benefits, the patient was deemed in                            satisfactory condition to undergo the procedure.                           After obtaining informed consent, the colonoscope                            was passed under direct vision. Throughout the                            procedure, the patient's blood pressure, pulse, and                            oxygen saturations  were monitored continuously.The                            colonoscopy was performed without difficulty. The                            patient tolerated the procedure well. The quality                            of the bowel preparation was good. The ileocecal                            valve, appendiceal orifice, and rectum were                            photographed. The Colonoscope was introduced                            through the anus and advanced to the the cecum,                            identified by appendiceal orifice and ileocecal                            valve. Scope In: 8:05:39 AM Scope Out: 8:44:32 AM Scope Withdrawal Time: 0 hours 35 minutes 44 seconds  Total Procedure Duration: 0 hours 38 minutes 53 seconds  Findings:                 The perianal and digital rectal examinations were                            normal.                           A few small-mouthed diverticula were found in the                            sigmoid colon and ascending colon, associated  superficial erythema noted around diverticulum in                            sigmoid colon.                           A polypectomy scar and tattoo was noted in the                            transverse colon with an area of roughly 5 to 6 mm                            residual polyp. The polyp was sessile. It was in an                            area was was extremely angulated, difficult to even                            visualize it. Multiple passes with the snare, using                            suction / positional changes were made in attempt                            to get the snare even near the lesion were made,                            this was technically extremely challenging.                            Eventually, most of the polyp was initially removed                            with one pass with the cold snare however some                             residual polyp on the proximal periphery persisted.                            Multiple passes were made with cold forceps to                            remove the residual base of it. Coagulation with                            the snare tip was then applied to the base of the                            lesion to treat any potential further residual                            polypoid tissue.  Internal hemorrhoids were found during retroflexion.                           The exam was otherwise without abnormality. Complications:            No immediate complications. Estimated blood loss:                            Minimal. Estimated Blood Loss:     Estimated blood loss was minimal. Impression:               - Diverticulosis in the sigmoid colon and in the                            ascending colon.                           - Small area of residual polyp in the transverse                            colon was outlined - this was extremely difficult                            to remove given the location - unfortunately could                            not remove the entire lesion with snare itself due                            to angulation and poor positioning, multi modality                            treatment applied as above.                           - Internal hemorrhoids.                           - The examination was otherwise normal. Recommendation:           - Patient has a contact number available for                            emergencies. The signs and symptoms of potential                            delayed complications were discussed with the                            patient. Return to normal activities tomorrow.                            Written discharge instructions were provided to the                            patient.                           -  Resume previous diet.                           - Continue present medications.                            - Await pathology results with further                            recommendations.                           - No ibuprofen, naproxen, or other non-steroidal                            anti-inflammatory drugs for 2 weeks after polyp                            removal. Remo Lipps P. Dwyne Hasegawa, MD 06/03/2019 8:56:51 AM This report has been signed electronically.

## 2019-06-03 NOTE — Patient Instructions (Addendum)
YOU HAD AN ENDOSCOPIC PROCEDURE TODAY AT [facility]:   Refer to the procedure report that was given to you for any specific questions about what was found during the examination.  If the procedure report does not answer your questions, please call your gastroenterologist to clarify.  If you requested that your care partner not be given the details of your procedure findings, then the procedure report has been included in a sealed envelope for you to review at your convenience later.  YOU SHOULD EXPECT: Some feelings of bloating in the abdomen. Passage of more gas than usual.  Walking can help get rid of the air that was put into your GI tract during the procedure and reduce the bloating. If you had a lower endoscopy (such as a colonoscopy or flexible sigmoidoscopy) you may notice spotting of blood in your stool or on the toilet paper. If you underwent a bowel prep for your procedure, you may not have a normal bowel movement for a few days.  Please Note:  You might notice some irritation and congestion in your nose or some drainage.  This is from the oxygen used during your procedure.  There is no need for concern and it should clear up in a day or so.  SYMPTOMS TO REPORT IMMEDIATELY:   Following lower endoscopy (colonoscopy or flexible sigmoidoscopy):  Excessive amounts of blood in the stool  Significant tenderness or worsening of abdominal pains  Swelling of the abdomen that is new, acute  Fever of 100F or higher  For urgent or emergent issues, a gastroenterologist can be reached at any hour by calling [phone].   DIET:  We do recommend a small meal at first, but then you may proceed to your regular diet.  Drink plenty of fluids but you should avoid alcoholic beverages for 24 hours.  ACTIVITY:  You should plan to take it easy for the rest of today and you should NOT DRIVE or use heavy machinery until tomorrow (because of the sedation medicines used during the test).     FOLLOW UP: Our staff will call the number listed on your records 48-72 hours following your procedure to check on you and address any questions or concerns that you may have regarding the information given to you following your procedure. If we do not reach you, we will leave a message.  We will attempt to reach you two times.  During this call, we will ask if you have developed any symptoms of COVID 19. If you develop any symptoms (ie: fever, flu-like symptoms, shortness of breath, cough etc.) before then, please call [phone].  If you test positive for Covid 19 in the 2 weeks post procedure, please call and report this information to [place].    If any biopsies were taken you will be contacted by phone or by letter within the next 1-3 weeks.  Please call us at [phone] if you have not heard about the biopsies in 3 weeks.    SIGNATURES/CONFIDENTIALITY: You and/or your care partner have signed paperwork which will be entered into your electronic medical record.  These signatures attest to the fact that that the information above on your After Visit Summary has been reviewed and is understood.  Full responsibility of the confidentiality of this discharge information lies with you and/or your care-partner.    Handouts were given to you on polyps, diverticulosis, and hemorrhoids. NO ASPIRIN, ASPIRIN CONTAINING PRODUCTS (BC OR GOODY POWDERS) OR NSAIDS (IBUPROFEN, ADVIL, ALEVE, AND  MOTRIN) FOR 2 weeks; TYLENOL IS OK TO TAKE You may resume your other current medications today. Await biopsy results. Please call if any questions or concerns.

## 2019-06-03 NOTE — Progress Notes (Signed)
Report given to PACU, vss 

## 2019-06-03 NOTE — Progress Notes (Signed)
No problems noted in the recovery room. maw 

## 2019-06-03 NOTE — Progress Notes (Signed)
VS-DT Temp-CH  Pt's states no medical or surgical changes since previsit or office visit.

## 2019-06-03 NOTE — Progress Notes (Signed)
Called to room to assist during endoscopic procedure.  Patient ID and intended procedure confirmed with present staff. Received instructions for my participation in the procedure from the performing physician.  

## 2019-06-05 ENCOUNTER — Telehealth: Payer: Self-pay

## 2019-06-05 NOTE — Telephone Encounter (Signed)
  Follow up Call-  Call back number 06/03/2019 08/20/2018  Post procedure Call Back phone  # [phone]  Permission to leave phone message Yes Yes  Some recent data might be hidden     Patient questions:  Do you have a fever, pain , or abdominal swelling? No. Pain Score  0 *  Have you tolerated food without any problems? Yes.    Have you been able to return to your normal activities? Yes.    Do you have any questions about your discharge instructions: Diet   No. Medications  No. Follow up visit  No.  Do you have questions or concerns about your Care? No.  Actions: * If pain score is 4 or above: No action needed, pain <4.  1. Have you developed a fever since your procedure? no  2.   Have you had an respiratory symptoms (SOB or cough) since your procedure? no  3.   Have you tested positive for COVID 19 since your procedure no  4.   Have you had any family members/close contacts diagnosed with the COVID 19 since your procedure?  no   If yes to any of these questions please route to Joylene John, RN and Alphonsa Gin, Therapist, sports.

## 2019-06-17 ENCOUNTER — Other Ambulatory Visit: Payer: Self-pay

## 2019-06-17 ENCOUNTER — Other Ambulatory Visit (INDEPENDENT_AMBULATORY_CARE_PROVIDER_SITE_OTHER): Payer: BC Managed Care – PPO

## 2019-06-17 DIAGNOSIS — E039 Hypothyroidism, unspecified: Secondary | ICD-10-CM

## 2019-06-17 DIAGNOSIS — E236 Other disorders of pituitary gland: Secondary | ICD-10-CM

## 2019-06-17 DIAGNOSIS — Z87898 Personal history of other specified conditions: Secondary | ICD-10-CM

## 2019-06-17 LAB — FOLLICLE STIMULATING HORMONE: FSH: 5.5 m[IU]/mL

## 2019-06-17 LAB — HEMOGLOBIN A1C: Hgb A1c MFr Bld: 5.8 % (ref 4.6–6.5)

## 2019-06-17 LAB — LUTEINIZING HORMONE: LH: 10.53 m[IU]/mL

## 2019-06-17 LAB — CORTISOL: Cortisol, Plasma: 12.2 ug/dL

## 2019-06-17 LAB — T4, FREE: Free T4: 1.03 ng/dL (ref 0.60–1.60)

## 2019-06-17 LAB — TSH: TSH: 0.03 u[IU]/mL — ABNORMAL LOW (ref 0.35–4.50)

## 2019-06-18 ENCOUNTER — Encounter: Payer: Self-pay | Admitting: Internal Medicine

## 2019-06-25 LAB — INSULIN-LIKE GROWTH FACTOR
IGF-I, LC/MS: 135 ng/mL (ref 52–328)
Z-Score (Female): -0.1 SD (ref ?–2.0)

## 2019-06-25 LAB — ACTH: C206 ACTH: 21 pg/mL (ref 6–50)

## 2019-07-08 ENCOUNTER — Other Ambulatory Visit: Payer: Self-pay | Admitting: Internal Medicine

## 2019-08-12 ENCOUNTER — Other Ambulatory Visit: Payer: Self-pay | Admitting: Neurosurgery

## 2019-08-12 DIAGNOSIS — D352 Benign neoplasm of pituitary gland: Secondary | ICD-10-CM

## 2019-09-04 ENCOUNTER — Ambulatory Visit
Admission: RE | Admit: 2019-09-04 | Discharge: 2019-09-04 | Disposition: A | Discharge status: Home / Self Care | Payer: BC Managed Care – PPO | Source: Ambulatory Visit | Attending: Neurosurgery | Admitting: Neurosurgery

## 2019-09-04 ENCOUNTER — Other Ambulatory Visit: Payer: Self-pay

## 2019-09-04 DIAGNOSIS — D352 Benign neoplasm of pituitary gland: Secondary | ICD-10-CM

## 2019-09-04 DIAGNOSIS — E893 Postprocedural hypopituitarism: Secondary | ICD-10-CM | POA: Diagnosis not present

## 2019-09-04 MED ORDER — GADOBENATE DIMEGLUMINE 529 MG/ML IV SOLN
8.0000 mL | Freq: Once | INTRAVENOUS | Status: AC | PRN
  Administered 2019-09-04: 8 mL via INTRAVENOUS

## 2019-09-22 DIAGNOSIS — R030 Elevated blood-pressure reading, without diagnosis of hypertension: Secondary | ICD-10-CM | POA: Diagnosis not present

## 2019-09-22 DIAGNOSIS — Z6832 Body mass index (BMI) 32.0-32.9, adult: Secondary | ICD-10-CM | POA: Diagnosis not present

## 2019-09-22 DIAGNOSIS — D352 Benign neoplasm of pituitary gland: Secondary | ICD-10-CM | POA: Diagnosis not present

## 2019-10-28 DIAGNOSIS — Z1231 Encounter for screening mammogram for malignant neoplasm of breast: Secondary | ICD-10-CM | POA: Diagnosis not present

## 2019-10-28 DIAGNOSIS — F419 Anxiety disorder, unspecified: Secondary | ICD-10-CM | POA: Insufficient documentation

## 2019-10-28 DIAGNOSIS — K219 Gastro-esophageal reflux disease without esophagitis: Secondary | ICD-10-CM | POA: Insufficient documentation

## 2019-10-28 DIAGNOSIS — Z01419 Encounter for gynecological examination (general) (routine) without abnormal findings: Secondary | ICD-10-CM | POA: Diagnosis not present

## 2019-10-28 DIAGNOSIS — Z6832 Body mass index (BMI) 32.0-32.9, adult: Secondary | ICD-10-CM | POA: Diagnosis not present

## 2019-12-17 ENCOUNTER — Other Ambulatory Visit: Payer: Self-pay

## 2019-12-17 MED ORDER — OMEPRAZOLE 20 MG PO CPDR: 20.0000 mg | DELAYED_RELEASE_CAPSULE | Freq: Every day | ORAL | 0 refills | Status: DC

## 2019-12-26 ENCOUNTER — Other Ambulatory Visit: Payer: Self-pay

## 2020-01-16 ENCOUNTER — Other Ambulatory Visit: Payer: Self-pay | Admitting: Internal Medicine

## 2020-05-03 ENCOUNTER — Encounter: Payer: Self-pay | Admitting: Internal Medicine

## 2020-05-07 ENCOUNTER — Other Ambulatory Visit: Payer: Self-pay

## 2020-05-07 ENCOUNTER — Ambulatory Visit (INDEPENDENT_AMBULATORY_CARE_PROVIDER_SITE_OTHER): Payer: BC Managed Care – PPO | Admitting: Internal Medicine

## 2020-05-07 ENCOUNTER — Encounter: Payer: Self-pay | Admitting: Internal Medicine

## 2020-05-07 VITALS — BP 122/88 | HR 80 | Ht 64.0 in | Wt 186.2 lb

## 2020-05-07 DIAGNOSIS — E236 Other disorders of pituitary gland: Secondary | ICD-10-CM | POA: Diagnosis not present

## 2020-05-07 DIAGNOSIS — E039 Hypothyroidism, unspecified: Secondary | ICD-10-CM | POA: Diagnosis not present

## 2020-05-07 DIAGNOSIS — Z87898 Personal history of other specified conditions: Secondary | ICD-10-CM | POA: Diagnosis not present

## 2020-05-07 LAB — T3, FREE: T3, Free: 3.6 pg/mL (ref 2.3–4.2)

## 2020-05-07 LAB — T4, FREE: Free T4: 0.99 ng/dL (ref 0.60–1.60)

## 2020-05-07 LAB — TSH: TSH: 0.05 u[IU]/mL — ABNORMAL LOW (ref 0.35–4.50)

## 2020-05-07 LAB — HEMOGLOBIN A1C: Hgb A1c MFr Bld: 6 % (ref 4.6–6.5)

## 2020-05-07 MED ORDER — SYNTHROID 112 MCG PO TABS: ORAL_TABLET | ORAL | 3 refills | Status: DC

## 2020-05-07 NOTE — Patient Instructions (Addendum)
Please stop at the lab.  Please continue Synthroid 112 mcg daily.  Take the thyroid hormone every day, with water, at least 30 minutes before breakfast, separated by at least 4 hours from: - acid reflux medications - calcium - iron - multivitamins  Please come back for a follow-up appointment in 1 year. 

## 2020-05-07 NOTE — Progress Notes (Signed)
Patient ID: Alyssa Bartlett, female   DOB: May 20, 1971, 49 y.o.   MRN: [id number]  This visit occurred during the SARS-CoV-2 public health emergency.  Safety protocols were in place, including screening questions prior to the visit, additional usage of staff PPE, and extensive cleaning of exam room while observing appropriate contact time as indicated for disinfecting solutions.   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 Last visit 1 year ago.  Hypothyroidism:  Reviewed history: Pt. has been dx with hypothyroidism in 2011; started Synthroid, then Levothyroxine >> did not feel good (aches, pains, fatigue) she was seeing [facility] providers and also saw endocrinology before; was on NatureThroid 48.7 mg bid (~160 mcg LT4 daily) >> was feeling better, weight normalized >> however, had increased anxiety - after an allergic rxn (or Niacin flush - was taking a B complex at that time), worse fatigue, insomnia. She is on Synthroid. Lexapro helped the above sxs.  Pt is on Synthroid 112 mcg daily, taken: - in am - fasting - at least 30 min from b'fast - no calcium - no iron - no multivitamins - + PPIs (Prilosec)-mostly during the day - not on Biotin  Reviewed patient's TFTs.  We usually follow her by free T4 levels since TSH levels are unreliable for her Lab Results  Component Value Date   TSH 0.03 (L) 06/17/2019   TSH 0.40 05/03/2018   TSH 0.09 (L) 09/27/2017   TSH 0.01 Repeated and verified X2. 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  - MRI (03/26/2015): The pituitary is enlarged with a fluid level evident on T2 weighted images. Blood products are present in the pituitary compatible with pituitary apoplexy. The gland measures 26 mm cephalo caudad dimension. It measures 19 x 19 mm in cross-sectional dimension. This displaces the optic chiasm superiorly. There is peripheral enhancement about the gland.   - s/p TSR b/c of visual loss (Dr. Kathyrn Sheriff)  - MRI (03/28/2015): Sequelae of recent transsphenoidal pituitary resection are identified. Surgical packing material is present in the posterior nasal cavity. The surgical cavity in the sella/ suprasellar cistern measures approximately 19 x 16 mm and demonstrates uniform peripheral enhancement of 3-4 mm in thickness, with anterior aspect open to the nasal cavity. The pituitary infundibulum is displaced superiorly and slightly rightward by these postoperative changes. No discrete nodular enhancement/residual mass is identified. There is no mass effect on the optic chiasm. There may be mild displacement of the prechiasmatic optic nerves, left more so than right.   Pituitary labs are normal with the exception of the pituitary-thyroid axis: Component     Latest Ref Rng 03/28/2015 04/09/2015  Cortisol - AM  6.7 - 22.6 ug/dL 16.7   TSH     0.35 - 4.50 uIU/mL  0.15 (L)  Free T4     0.60 - 1.60 ng/dL  1.77 (H)  T3, Free     2.3 - 4.2 pg/mL  3.4  Cortisol, Plasma       11.5  C206 ACTH     6 - 50 pg/mL  21  Prolactin       5.3  She denies increased thirst or urination  Visual field test at the end of 2016 >> significant improvement in vision after surgery. 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  09/16/2015: MRI pituitary: showed perhaps minimal peripheral residual tumor but otherwise a gross resection, mild sphenoid and right maxillary T2-hyperintense mucous retention cysts, and otherwise nicely patent and well-healed sinusotomies.   Component     Latest Ref Rng 10/28/2015  TSH     0.35 - 4.50 uIU/mL 0.07 (L)  T4,Free(Direct)     0.60 - 1.60 ng/dL 1.16  Prolactin      7.1  Cortisol, Plasma      15.0  LH      2.55  FSH      4.9    MRI (08/2016): Stable residual tumor versus scar tissue, but believed to be more consistent with scar tissue.  Component     Latest Ref Rng & Units 09/27/2017  IGF-I, LC/MS     52 - 328 ng/mL 196  Z-Score (Female)     -2.0 - 2 SD 0.7  TSH     0.35 - 4.50 uIU/mL 0.09 (L)  Prolactin     ng/mL 8.3  LH     mIU/mL 0.96  FSH     mIU/ML 3.4  C206 ACTH     6 - 50 pg/mL 28  Cortisol, Plasma     ug/dL 8.6  Triiodothyronine,Free,Serum     2.3 - 4.2 pg/mL 3.4  T4,Free(Direct)     0.60 - 1.60 ng/dL 0.99    MRI (09/2017): stable  Component     Latest Ref Rng & Units 05/03/2018  IGF-I, LC/MS     52 - 328 ng/mL 147  Z-Score (Female)     -2.0 - 2 SD 0.1  TSH     0.35 - 4.50 uIU/mL 0.40  Prolactin     ng/mL 9.4  LH     mIU/mL 0.76  FSH     mIU/ML 3.0  T4,Free(Direct)     0.60 - 1.60 ng/dL 1.05  Triiodothyronine,Free,Serum     2.3 - 4.2 pg/mL 3.8  Cortisol, Plasma     ug/dL 6.4  C206 ACTH     6 - 50 pg/mL 21   Component     Latest Ref Rng & Units 06/17/2019  IGF-I, LC/MS     52 - 328 ng/mL 135  Z-Score (Female)     -2.0 - 2 SD -0.1  C206 ACTH     6 - 50 pg/mL 21  Cortisol, Plasma     ug/dL 12.2  LH     mIU/mL 10.53  FSH     mIU/ML 5.5  T4,Free(Direct)     0.60 - 1.60 ng/dL 1.03  TSH     0.35 - 4.50 uIU/mL 0.03 (L)   MRI (09/04/2019):   1. Continued stable postoperative appearance of the pituitary and regional soft tissues.  2. Otherwise normal MRI appearance of the  brain.  Menstrual cycles are regular, but in the last 5-6 mo: skipped  1 mo, has lighter cycles.  No headaches or blurry vision.  She also has a history of prediabetes: Lab Results  Component Value Date   HGBA1C 5.8 06/17/2019   HGBA1C 5.5 03/12/2015   She has a history of mild hyperlipidemia, but latest lipids were at goal: Lab Results  Component Value Date   CHOL 163 03/12/2015   HDL 58.30 03/12/2015   LDLCALC 89 03/12/2015   TRIG 79.0 03/12/2015   CHOLHDL 3 03/12/2015    ROS: Constitutional: no weight gain/no weight loss, no fatigue, no subjective hyperthermia, no subjective hypothermia Eyes: no blurry vision, no xerophthalmia ENT: no sore throat, + see HPI Cardiovascular: no CP/no SOB/no palpitations/no leg swelling Respiratory: no cough/no SOB/no wheezing Gastrointestinal: no N/no V/no D/no C/no acid reflux Musculoskeletal: no muscle aches/no joint aches Skin: no rashes, no hair loss Neurological: no tremors/no numbness/no tingling/no dizziness  I reviewed pt's medications, allergies, PMH, social hx, family hx, and changes were documented in the history of present illness. Otherwise, unchanged from my initial visit note.  Past Medical History  Diagnosis Date  . Hypothyroidism    . Mood disorder (HCC)     GAD, panic, depression  . History of chicken pox   . Allergic reaction to food     ? shellfish  . Prediabetes   . Mild hyperlipidemia   . Tobacco use    Past Surgical History:  Procedure Laterality Date  . COLONOSCOPY    . CRANIOTOMY N/A 03/26/2015   Procedure: CRANIOTOMY HYPOPHYSECTOMY TRANSNASAL APPROACH;  Surgeon: Consuella Lose, MD;  Location: [facility];  Service: Neurosurgery;  Laterality: N/A;  . POLYPECTOMY    . SINUS ENDO W/FUSION N/A 03/26/2015   Procedure: ENDOSCOPIC SINUS SURGERY WITH NAVIGATION;  Surgeon: Ruby Cola, MD;  Location: [facility];  Service: ENT;  Laterality: N/A;   History   Social History  . Marital Status: Single     Spouse Name: N/A  . Number of Children: 0   Occupational History  . intake speacialists    Social History Main Topics  . Smoking status: Former Smoker -- 1.00 packs/day for 14 years    Types: Cigarettes    Start date: 07/20/2008  . Smokeless tobacco: Not on file  . Alcohol Use: Yes     Comment: wine 2-3 drinks 4-5x a week  . 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 escitalopram (LEXAPRO) 20 MG tablet Take 20 mg by mouth at bedtime.    Marland Kitchen LORazepam (ATIVAN) 0.5 MG tablet Take 0.5 mg by mouth 2 (two) times daily. Take 1 tablet in the morning and half tablet in the evening    . omeprazole (PRILOSEC) 20 MG capsule Take 1-2 capsules (20-40 mg total) by mouth daily. 90 capsule 0  . sodium chloride (OCEAN) 0.65 % SOLN nasal spray Place 1 spray into both nostrils as needed for congestion.    Marland Kitchen SYNTHROID 112 MCG tablet TAKE 1 TABLET(112 MCG) BY MOUTH DAILY BEFORE BREAKFAST 90 tablet 1   No current facility-administered medications on file prior to visit.  No Known Allergies Family History  Problem Relation Age of Onset  . Allergic rhinitis Mother   . Hyperlipidemia Mother   . Diabetes Mother   . Allergic rhinitis Father   . Prostate cancer Father   . Hyperlipidemia Father   . Heart disease Father 36       CABG  . Heart attack Father 72  . Diverticulitis Father        had surgery  . Diabetes Father   . Colon polyps Father   . Heart disease Maternal Grandfather   . Alzheimer's disease Maternal Grandfather   . Colon cancer Neg Hx   . Rectal cancer Neg Hx   . Esophageal cancer Neg Hx   . Stomach cancer Neg Hx   HTN in M and F  PE: BP  122/88   Pulse 80   Ht 5\' 4"  (1.626 m)   Wt 186 lb 3.2 oz (84.5 kg)   SpO2 96%   BMI 31.96 kg/m  Wt Readings from Last 3 Encounters:  05/07/20 186 lb 3.2 oz (84.5 kg)  06/03/19 186 lb (84.4 kg)  05/20/19 186 lb (84.4 kg)   Constitutional: overweight, in NAD Eyes: PERRLA, EOMI, no exophthalmos ENT: moist mucous membranes, no thyromegaly, no cervical lymphadenopathy Cardiovascular: RRR, No MRG Respiratory: CTA B Gastrointestinal: abdomen soft, NT, ND, BS+ Musculoskeletal: no deformities, strength intact in all 4 Skin: moist, warm, no rashes Neurological: no tremor with outstretched hands, DTR normal in all 4  ASSESSMENT: 1.  Central hypothyroidism  01/01/2015: Thyroid U/S:   Right thyroid lobe: 3.3 cm x 0.8 cm x 0.9 cm. Small hypoechoic nodule at the inferior right thyroid measures approximately 3 mm. Heterogeneous appearance of the thyroid tissue  Left thyroid lobe: 3.1 cm x 0.6 cm x 0.8 cm. No nodules. Heterogeneous appearance of the thyroid tissue.  Isthmus Thickness: 3 mm. No nodules visualized.  Lymphadenopathy: None visualized. IMPRESSION: Relatively unremarkable appearance of the thyroid with single small benign-appearing nodule on the right. No left-sided nodules.  2. H/o Pituitary tumor with apoplexy  3. H/o prediabetes  PLAN:  1. Patient with longstanding hypothyroidism, previously on Nature-Throid, but now on Synthroid.  We are following her by free T4 levels since she most likely has central hypothyroidism and TSH levels that are unreliable - latest thyroid labs reviewed with pt >> normal free T4  - she continues on Synthroid d.a.w. 112 mcg daily - pt feels good on this dose. - we discussed about taking the thyroid hormone every day, with water, >30 minutes before breakfast, separated by >4 hours from acid reflux medications, calcium, iron, multivitamins. Pt. is taking it correctly.  At last visit, we moved Prilosec later in the day as she was taking only 30  minutes after LT4. - will check thyroid tests today: TSH and fT4 - If labs are abnormal, she will need to return for repeat TFTs in 1.5 months  2. H/o Pituitary apoplexy - She is s/p transsphenoidal resection of pituitary tumor after an episode of hemorrhage.  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  Her visual capacity was normal after surgery and she is asymptomatic now.  Visual field greatly improved in 2017.  She is followed by neurosurgery, Dr. Kathyrn Sheriff, with last MRI in 09/2017.  This showed stable scar tissue versus recurrence in the sella, but it was felt that this was most likely scar tissue.  On the latest MRI from 08/2019, there was no suspicious mass. - We checked  her pituitary hormones after the surgery and they were normal, except for a low TSH which most likely implies central hypothyroidism.  -We reviewed together her pituitary lab results from 05/2019 and they were all normal (excluding her TSH) -We will repeat her pituitary labs at next visit, we will skip this year -I will see her back in 1 year  3. H/o Prediabetes - gained ~50 lbs in last 10 years  - last HbA1c is 5.8% >> will recheck today - discussed improved diet >> will cut out dairy (cheese) and sweets after the Holidays  Needs to lay down during lab draw, also use smallest needle.   Needs refills.  Component     Latest Ref Rng & Units 05/07/2020  TSH     0.35 - 4.50 uIU/mL 0.05 (L)  T4,Free(Direct)     0.60 - 1.60 ng/dL 0.99  Triiodothyronine,Free,Serum     2.3 - 4.2 pg/mL 3.6  Hemoglobin A1C     4.6 - 6.5 % 6.0  TSH suppressed, while free T4 and free T3 levels are normal.  We will refill the same dose of levothyroxine. HbA1c slightly higher than before.  She is planning to start working on her diet right after the holidays.  Philemon Kingdom, MD PhD [facility] Endocrinology

## 2020-08-27 ENCOUNTER — Encounter: Payer: Self-pay | Admitting: Gastroenterology

## 2020-10-13 DIAGNOSIS — Z713 Dietary counseling and surveillance: Secondary | ICD-10-CM | POA: Diagnosis not present

## 2020-11-08 ENCOUNTER — Ambulatory Visit (INDEPENDENT_AMBULATORY_CARE_PROVIDER_SITE_OTHER): Payer: BC Managed Care – PPO | Admitting: Gastroenterology

## 2020-11-08 ENCOUNTER — Encounter: Payer: Self-pay | Admitting: Gastroenterology

## 2020-11-08 VITALS — BP 122/86 | HR 74 | Ht 64.0 in | Wt 188.5 lb

## 2020-11-08 DIAGNOSIS — Z6832 Body mass index (BMI) 32.0-32.9, adult: Secondary | ICD-10-CM | POA: Diagnosis not present

## 2020-11-08 DIAGNOSIS — Z1231 Encounter for screening mammogram for malignant neoplasm of breast: Secondary | ICD-10-CM | POA: Diagnosis not present

## 2020-11-08 DIAGNOSIS — Z79899 Other long term (current) drug therapy: Secondary | ICD-10-CM | POA: Diagnosis not present

## 2020-11-08 DIAGNOSIS — N760 Acute vaginitis: Secondary | ICD-10-CM | POA: Diagnosis not present

## 2020-11-08 DIAGNOSIS — K219 Gastro-esophageal reflux disease without esophagitis: Secondary | ICD-10-CM | POA: Diagnosis not present

## 2020-11-08 DIAGNOSIS — Z86010 Personal history of colonic polyps: Secondary | ICD-10-CM

## 2020-11-08 DIAGNOSIS — Z01419 Encounter for gynecological examination (general) (routine) without abnormal findings: Secondary | ICD-10-CM | POA: Diagnosis not present

## 2020-11-08 MED ORDER — SUTAB 1479-225-188 MG PO TABS: 1.0000 | ORAL_TABLET | Freq: Once | ORAL | 0 refills | Status: AC

## 2020-11-08 MED ORDER — OMEPRAZOLE 40 MG PO CPDR: 40.0000 mg | DELAYED_RELEASE_CAPSULE | Freq: Every day | ORAL | 3 refills | Status: DC

## 2020-11-08 NOTE — Patient Instructions (Addendum)
If you are age 50 or older, your body mass index should be between 23-30. Your Body mass index is 32.36 kg/m. If this is out of the aforementioned range listed, please consider follow up with your Primary Care Provider.  If you are age 37 or younger, your body mass index should be between 19-25. Your Body mass index is 32.36 kg/m. If this is out of the aformentioned range listed, please consider follow up with your Primary Care Provider.   You have been scheduled for an endoscopy and colonoscopy. Please follow the written instructions given to you at your visit today. Please pick up your prep supplies at the pharmacy within the next 1-3 days. If you use inhalers (even only as needed), please bring them with you on the day of your procedure.  We have sent the following medications to your pharmacy for you to pick up at your convenience: omeprazole 40 mg: Take once daily   Thank you for entrusting me with your care and for choosing [facility], Dr. [place] Cellar     The [facility] providers would like to encourage you to use [facility] to communicate with providers for non-urgent requests or questions.  Due to long hold times on the telephone, sending your provider a message by [facility] may be a faster and more efficient way to get a response.  Please allow 48 business hours for a response.  Please remember that this is for non-urgent requests.

## 2020-11-08 NOTE — Progress Notes (Signed)
HPI :  50 year old female here for a follow-up visit for history of colon polyps and GERD.  See prior notes for details of her case.  She had a colonoscopy in March 2020, she had multiple advanced polyps removed at that time, the largest 15 mm transverse colon polyp that was wrapped around a very angulated fold and technically quite difficult to remove.  This returned as an adenoma.  We followed up with a surveillance exam in December 2020.  She had residual polyp at the area that was again quite difficult to remove not due to his size but due to location and a very angulated area.  It was removed in piecemeal and base was cauterized with snare tip coagulation.  I had recommended another anoscopy about a year later so she is due for that.  She states she is not aware of any family history of colon cancer.  Her bowels appear regular.  She inquires about having an endoscopy at the same time as her colonoscopy.  She does have a history of reflux including pyrosis, water brash, and regurgitation that has been bothering her for the past few years more so than before.  She has been on omeprazole 20 mg daily but still has symptoms almost every day the week despite this, it just is not as severe.  She states she has had about 50% improvement on this current dose of omeprazole.  She denies any dysphagia.  No postprandial nausea or vomiting.  Does have nocturnal symptoms with regurgitation and occasional vomiting.  She is bit frustrated that her symptoms still persist.  In discussing her risk factors for this she has endorsed some weight gain over the past year.  She is seeing endocrinology for management of her thyroid.  Her grandmother had esophageal cancer and she is concerned about that.   Endoscopic history:  Colonoscopy 08/21/18 -A few small-mouthed diverticula were found in the ascending colon. A 3 mm polyp was found in the cecum. The polyp was flat. The polyp was removed with a cold snare. Resection and  retrieval were complete. A 15 mm polyp was found in the transverse colon. The polyp was semi-sessile and wrapped around an angulated fold, was difficult to gain good position for polypectomy. The polyp was removed with a piecemeal technique using a cold snare. Resection and retrieval were complete. Area just distal to the lesion was tattooed with an injection of Spot (carbon black). Two pedunculated polyps were found in the sigmoid colon. The polyps were 10 to 12 mm in size with inflammtory changes, suspect these could be the cause of bleeding. These polyps were removed with a hot snare. Resection and retrieval were complete. Four sessile polyps were found in the rectum and recto-sigmoid colon. The polyps were 2 to 3 mm in size. These polyps were removed with a cold snare. Resection and retrieval were Complete. Internal hemorrhoids were found during retroflexion.  Most polyps adenomas / sessile serrated - with hyperplastic polyps in the rectum / rectosigmoid colon   Colonoscopy 06/03/19 - The perianal and digital rectal examinations were normal. - A few small-mouthed diverticula were found in the sigmoid colon and ascending colon, associated superficial erythema noted around diverticulum in sigmoid colon. - A polypectomy scar and tattoo was noted in the transverse colon with an area of roughly 5 to 6 mm residual polyp. The polyp was sessile. It was in an area was was extremely angulated, difficult to even visualize it. Multiple passes with the snare, using suction /  positional changes were made in attempt to get the snare even near the lesion were made, this was technically extremely challenging. Eventually, most of the polyp was initially removed with one pass with the cold snare however some residual polyp on the proximal periphery persisted. Multiple passes were made with cold forceps to remove the residual base of it. Coagulation with the snare tip was then applied to the base of the lesion to  treat any potential further residual polypoid tissue. - Internal hemorrhoids were found during retroflexion. - The exam was otherwise without abnormality.  Surgical [P], colon, transverse, polyp - TUBULAR ADENOMA (MULTIPLE FRAGMENTS). - NO HIGH GRADE DYSPLASIA OR MALIGNANCY   Past Medical History:  Diagnosis Date  . Allergic reaction to food    ? shellfish  . Anxiety   . GERD (gastroesophageal reflux disease)   . Hemorrhoid 09/17/2015  . History of chicken pox   . Hypothyroidism    managed by [facility]  . Mild hyperlipidemia    no meds  . Mood disorder (HCC)    GAD, panic, depression  . Prediabetes   . Tobacco use      Past Surgical History:  Procedure Laterality Date  . COLONOSCOPY    . CRANIOTOMY N/A 03/26/2015   Procedure: CRANIOTOMY HYPOPHYSECTOMY TRANSNASAL APPROACH;  Surgeon: Consuella Lose, MD;  Location: [facility];  Service: Neurosurgery;  Laterality: N/A;  . POLYPECTOMY    . SINUS ENDO W/FUSION N/A 03/26/2015   Procedure: ENDOSCOPIC SINUS SURGERY WITH NAVIGATION;  Surgeon: Ruby Cola, MD;  Location: [facility];  Service: ENT;  Laterality: N/A;   Family History  Problem Relation Age of Onset  . Allergic rhinitis Mother   . Hyperlipidemia Mother   . Diabetes Mother   . Allergic rhinitis Father   . Prostate cancer Father   . Hyperlipidemia Father   . Heart disease Father 23       CABG  . Heart attack Father 52  . Diverticulitis Father        had surgery  . Diabetes Father   . Colon polyps Father   . Heart disease Maternal Grandfather   . Alzheimer's disease Maternal Grandfather   . Colon cancer Neg Hx   . Rectal cancer Neg Hx   . Esophageal cancer Neg Hx   . Stomach cancer Neg Hx    Social History   Tobacco Use  . Smoking status: Former Smoker    Packs/day: 1.00    Years: 14.00    Pack years: 14.00    Types: Cigarettes    Quit date: 2011    Years since quitting: 11.3  . Smokeless tobacco: Never Used  . Tobacco  comment:  Nicotine gum quit 04/2017  Vaping Use  . Vaping Use: Never used  Substance Use Topics  . Alcohol use: Yes    Alcohol/week: 5.0 - 7.0 standard drinks    Types: 5 - 7 Glasses of wine per week  . Drug use: No   Current Outpatient Medications  Medication Sig Dispense Refill  . escitalopram (LEXAPRO) 20 MG tablet Take 20 mg by mouth at bedtime.    Marland Kitchen LORazepam (ATIVAN) 0.5 MG tablet Take 0.5 mg by mouth 2 (two) times daily. Take 1 tablet in the morning and half tablet in the evening    . omeprazole (PRILOSEC) 20 MG capsule Take 1-2 capsules (20-40 mg total) by mouth daily. 90 capsule 0  . sodium chloride (OCEAN) 0.65 % SOLN nasal spray Place 1 spray into  both nostrils as needed for congestion.    Marland Kitchen SYNTHROID 112 MCG tablet Take 1 tablet daily before breakfast 90 tablet 3   No current facility-administered medications for this visit.   No Known Allergies   Review of Systems: All systems reviewed and negative except where noted in HPI.   Lab Results  Component Value Date   WBC 7.2 10/01/2017   HGB 13.1 10/01/2017   HCT 38.4 10/01/2017   MCV 90.8 10/01/2017   PLT 296.0 10/01/2017    Lab Results  Component Value Date   CREATININE 0.72 02/24/2016   BUN 14 02/24/2016   NA 141 02/24/2016   K 4.5 02/24/2016   CL 105 02/24/2016   CO2 30 02/24/2016    Lab Results  Component Value Date   ALT 15 01/09/2009   AST 20 01/09/2009   ALKPHOS 55 01/09/2009   BILITOT 0.6 01/09/2009     Physical Exam: BP 122/86   Pulse 74   Ht 5\' 4"  (1.626 m)   Wt 188 lb 8 oz (85.5 kg)   BMI 32.36 kg/m  Constitutional: Pleasant,well-developed, female in no acute distress. Neurological: Alert and oriented to person place and time. Psychiatric: Normal mood and affect. Behavior is normal.   ASSESSMENT AND PLAN: 50 year old female here for reassessment of following:  GERD Long term use of PPI History of colon polyps  History as above.  The patient has had advanced adenomas 1 of  which has been very difficult to remove purely due to location in a very angulated portion of her colon with poor visualization.  This has been treated twice and recommending surveillance colonoscopy at this time to ensure no residual polyp there.  I have discussed risk benefits of colonoscopy and anesthesia with her and she wants to proceed.  Otherwise she inquires about doing an endoscopy at the same time as her colonoscopy.  She has had persistent reflux symptoms over the past few years despite omeprazole once daily.  She continues to have a lot of nocturnal symptoms of bother her.  I offered her an endoscopy to be done at the same time given these persistent symptoms.  I reassured her that her risk for esophageal cancer are quite low and she has no alarm symptoms but for peace of mind she would feel much more comfortable having this done.  In the interim we will increase her omeprazole to 40 mg a day to see if we can get better control, and she can use Gaviscon as needed for breakthrough.  We otherwise discussed long-term risk benefits of chronic PPI use, and want to use the lowest dose of PPI needed to control symptoms however low dose is currently not working for her.  I otherwise suspect weight gain could be contributing to her worsening reflux symptoms and weight loss could potentially really help minimize the symptoms.  She will continue to work on that.  Plan: - proceed with scheduling endoscopy and colonoscopy - discussed long term PPI use - risks / benefits - will increase omeprazole to 40mg  / day - add Gaviscon PRN for breakthrough - weight loss - further recs pending results and her course  Inavale Cellar, MD [facility]

## 2021-02-15 ENCOUNTER — Other Ambulatory Visit: Payer: Self-pay

## 2021-02-15 ENCOUNTER — Ambulatory Visit (AMBULATORY_SURGERY_CENTER): Payer: BC Managed Care – PPO | Admitting: Gastroenterology

## 2021-02-15 ENCOUNTER — Encounter: Payer: Self-pay | Admitting: Gastroenterology

## 2021-02-15 VITALS — BP 106/75 | HR 72 | Temp 98.0°F | Resp 14 | Ht 64.0 in | Wt 188.0 lb

## 2021-02-15 DIAGNOSIS — K449 Diaphragmatic hernia without obstruction or gangrene: Secondary | ICD-10-CM

## 2021-02-15 DIAGNOSIS — K219 Gastro-esophageal reflux disease without esophagitis: Secondary | ICD-10-CM

## 2021-02-15 DIAGNOSIS — K635 Polyp of colon: Secondary | ICD-10-CM

## 2021-02-15 DIAGNOSIS — Z86010 Personal history of colonic polyps: Secondary | ICD-10-CM

## 2021-02-15 DIAGNOSIS — D127 Benign neoplasm of rectosigmoid junction: Secondary | ICD-10-CM | POA: Diagnosis not present

## 2021-02-15 DIAGNOSIS — Z1211 Encounter for screening for malignant neoplasm of colon: Secondary | ICD-10-CM | POA: Diagnosis not present

## 2021-02-15 DIAGNOSIS — D120 Benign neoplasm of cecum: Secondary | ICD-10-CM

## 2021-02-15 MED ORDER — SODIUM CHLORIDE 0.9 % IV SOLN: 500.0000 mL | Freq: Once | INTRAVENOUS | Status: AC

## 2021-02-15 NOTE — Progress Notes (Signed)
Pt in recovery with monitors in place, VSS. Report given to receiving RN. Bite guard was placed with pt awake to ensure comfort. No dental or soft tissue damage noted. 

## 2021-02-15 NOTE — Progress Notes (Signed)
Pt's states no medical or surgical changes since previsit or office visit. VS assessed by C.W 

## 2021-02-15 NOTE — Patient Instructions (Signed)
Information on polyps, hemorrhoids, and hiatal hernias.  Await pathology results.  Resume previous diet and medications.  Can increase Omeprazole to twice a day dosing as needed for breakthrough reflux symptoms.   YOU HAD AN ENDOSCOPIC PROCEDURE TODAY AT [facility]:   Refer to the procedure report that was given to you for any specific questions about what was found during the examination.  If the procedure report does not answer your questions, please call your gastroenterologist to clarify.  If you requested that your care partner not be given the details of your procedure findings, then the procedure report has been included in a sealed envelope for you to review at your convenience later.  YOU SHOULD EXPECT: Some feelings of bloating in the abdomen. Passage of more gas than usual.  Walking can help get rid of the air that was put into your GI tract during the procedure and reduce the bloating. If you had a lower endoscopy (such as a colonoscopy or flexible sigmoidoscopy) you may notice spotting of blood in your stool or on the toilet paper. If you underwent a bowel prep for your procedure, you may not have a normal bowel movement for a few days.  Please Note:  You might notice some irritation and congestion in your nose or some drainage.  This is from the oxygen used during your procedure.  There is no need for concern and it should clear up in a day or so.  SYMPTOMS TO REPORT IMMEDIATELY:  Following lower endoscopy (colonoscopy or flexible sigmoidoscopy):  Excessive amounts of blood in the stool  Significant tenderness or worsening of abdominal pains  Swelling of the abdomen that is new, acute  Fever of 100F or higher  Following upper endoscopy (EGD)  Vomiting of blood or coffee ground material  New chest pain or pain under the shoulder blades  Painful or persistently difficult swallowing  New shortness of breath  Fever of 100F or higher  Black, tarry-looking  stools  For urgent or emergent issues, a gastroenterologist can be reached at any hour by calling [phone]. Do not use MyChart messaging for urgent concerns.    DIET:  We do recommend a small meal at first, but then you may proceed to your regular diet.  Drink plenty of fluids but you should avoid alcoholic beverages for 24 hours.  ACTIVITY:  You should plan to take it easy for the rest of today and you should NOT DRIVE or use heavy machinery until tomorrow (because of the sedation medicines used during the test).    FOLLOW UP: Our staff will call the number listed on your records 48-72 hours following your procedure to check on you and address any questions or concerns that you may have regarding the information given to you following your procedure. If we do not reach you, we will leave a message.  We will attempt to reach you two times.  During this call, we will ask if you have developed any symptoms of COVID 19. If you develop any symptoms (ie: fever, flu-like symptoms, shortness of breath, cough etc.) before then, please call [phone].  If you test positive for Covid 19 in the 2 weeks post procedure, please call and report this information to [place].    If any biopsies were taken you will be contacted by phone or by letter within the next 1-3 weeks.  Please call us at [phone] if you have not heard about the biopsies in 3 weeks.  SIGNATURES/CONFIDENTIALITY: You and/or your care partner have signed paperwork which will be entered into your electronic medical record.  These signatures attest to the fact that that the information above on your After Visit Summary has been reviewed and is understood.  Full responsibility of the confidentiality of this discharge information lies with you and/or your care-partner.

## 2021-02-15 NOTE — Op Note (Signed)
[place] Patient Name: Alyssa Bartlett Procedure Date: 02/15/2021 9:49 AM MRN: [id number] Endoscopist: Remo Lipps P. Havery Moros , MD Age: 50 Referring MD:  Date of Birth: Mar 16, 1971 Gender: Female Account #: [id number] Procedure:                Upper GI endoscopy Indications:              history of gastro-esophageal reflux disease -                            poorly controlled on omeprazole '20mg'$ , now on '40mg'$  /                            day with significant improvement in symptoms Medicines:                Monitored Anesthesia Care Procedure:                Pre-Anesthesia Assessment:                           - Prior to the procedure, a History and Physical                            was performed, and patient medications and                            allergies were reviewed. The patient's tolerance of                            previous anesthesia was also reviewed. The risks                            and benefits of the procedure and the sedation                            options and risks were discussed with the patient.                            All questions were answered, and informed consent                            was obtained. Prior Anticoagulants: The patient has                            taken no previous anticoagulant or antiplatelet                            agents. ASA Grade Assessment: II - A patient with                            mild systemic disease. After reviewing the risks                            and benefits, the patient was deemed in  satisfactory condition to undergo the procedure.                           After obtaining informed consent, the endoscope was                            passed under direct vision. Throughout the                            procedure, the patient's blood pressure, pulse, and                            oxygen saturations were monitored continuously. The                            GIF  [id number] was introduced through the                            mouth, and advanced to the second part of duodenum.                            The upper GI endoscopy was accomplished without                            difficulty. The patient tolerated the procedure                            well. Scope In: Scope Out: Findings:                 Esophagogastric landmarks were identified: the                            Z-line was found at 37 cm, the gastroesophageal                            junction was found at 37 cm and the upper extent of                            the gastric folds was found at 39 cm from the                            incisors.                           A 2 cm hiatal hernia was present.                           Very mild / diminutive focal area esophagitis was                            found 37 cm from the incisors.                           The exam of the esophagus  was otherwise normal.                           The entire examined stomach was normal.                           The duodenal bulb and second portion of the                            duodenum were normal. Complications:            No immediate complications. Estimated blood loss:                            None. Estimated Blood Loss:     Estimated blood loss: none. Impression:               - Esophagogastric landmarks identified.                           - 2 cm hiatal hernia.                           - Very mild focal area of reflux esophagitis.                           - Normal stomach.                           - Normal duodenal bulb and second portion of the                            duodenum. Recommendation:           - Patient has a contact number available for                            emergencies. The signs and symptoms of potential                            delayed complications were discussed with the                            patient. Return to normal activities tomorrow.                             Written discharge instructions were provided to the                            patient.                           - Resume previous diet.                           - Continue present medications.                           -  Can increase omeprazole to twice daily dosing as                            needed for any breakthrough reflux symptoms. Long                            term use the lowest dose needed to control symptoms                           - Await pathology results. Remo Lipps P. Havery Moros, MD 02/15/2021 10:40:47 AM This report has been signed electronically.

## 2021-02-15 NOTE — Progress Notes (Signed)
Called to room to assist during endoscopic procedure.  Patient ID and intended procedure confirmed with present staff. Received instructions for my participation in the procedure from the performing physician.  

## 2021-02-15 NOTE — Progress Notes (Signed)
[facility] History and Physical   Primary Care Physician:  Patient, [name]   Reason for Procedure:   GERD, history of colon polyps  Plan:    EGD and colonoscopy     HPI: Alyssa Bartlett is a 50 y.o. female with a history of colon polyps - difficult to remove polyp with recurrence in the past, here for surveillance exam. She denies problems with her bowels. She also has a history of GERD, had been poorly controlled on low dose omeprazole, doing much better on '40mg'$  / day more recently. EGD to further evaluate, rule out BE. Otherwise feels well without complaints.   Past Medical History:  Diagnosis Date   Allergic reaction to food    ? shellfish   Anxiety    GERD (gastroesophageal reflux disease)    Hemorrhoid 09/17/2015   History of chicken pox    Hypothyroidism    managed by Alyssa Bartlett   Mild hyperlipidemia    no meds   Mood disorder (HCC)    GAD, panic, depression   Prediabetes    Tobacco use     Past Surgical History:  Procedure Laterality Date   COLONOSCOPY     CRANIOTOMY N/A 03/26/2015   Procedure: CRANIOTOMY HYPOPHYSECTOMY TRANSNASAL APPROACH;  Surgeon: Alyssa Lose, MD;  Location: [facility];  Service: Neurosurgery;  Laterality: N/A;   POLYPECTOMY     SINUS ENDO W/FUSION N/A 03/26/2015   Procedure: ENDOSCOPIC SINUS SURGERY WITH NAVIGATION;  Surgeon: Alyssa Cola, MD;  Location: [facility];  Service: ENT;  Laterality: N/A;    Prior to Admission medications   Medication Sig Start Date End Date Taking? Authorizing Provider  dexamethasone 0.5 MG/5ML elixir Take by mouth. 02/03/21  Yes [provider]  escitalopram (LEXAPRO) 20 MG tablet Take 20 mg by mouth at bedtime.   Yes [provider]  LORazepam (ATIVAN) 0.5 MG tablet Take 0.5 mg by mouth 2 (two) times daily. Take 1 tablet in the morning and half tablet in the evening   Yes [provider]  omeprazole (PRILOSEC) 40 MG capsule Take 1  capsule (40 mg total) by mouth daily. 11/08/20  Yes Alyssa Bartlett, Alyssa Raspberry, MD  SYNTHROID 112 MCG tablet Take 1 tablet daily before breakfast 05/07/20  Yes Alyssa Kingdom, MD  sodium chloride (OCEAN) 0.65 % SOLN nasal spray Place 1 spray into both nostrils as needed for congestion. Patient not taking: Reported on 02/15/2021    [provider]    Current Outpatient Medications  Medication Sig Dispense Refill   dexamethasone 0.5 MG/5ML elixir Take by mouth.     escitalopram (LEXAPRO) 20 MG tablet Take 20 mg by mouth at bedtime.     LORazepam (ATIVAN) 0.5 MG tablet Take 0.5 mg by mouth 2 (two) times daily. Take 1 tablet in the morning and half tablet in the evening     omeprazole (PRILOSEC) 40 MG capsule Take 1 capsule (40 mg total) by mouth daily. 90 capsule 3   SYNTHROID 112 MCG tablet Take 1 tablet daily before breakfast 90 tablet 3   sodium chloride (OCEAN) 0.65 % SOLN nasal spray Place 1 spray into both nostrils as needed for congestion. (Patient not taking: Reported on 02/15/2021)     Current Facility-Administered Medications  Medication Dose Route Frequency Provider Last Rate Last Admin   0.9 %  sodium chloride infusion  500 mL Intravenous Once Alyssa Bartlett, Alyssa Raspberry, MD        Allergies as of 02/15/2021   (  No Known Allergies)    Family History  Problem Relation Age of Onset   Allergic rhinitis Mother    Hyperlipidemia Mother    Diabetes Mother    Allergic rhinitis Father    Prostate cancer Father    Hyperlipidemia Father    Heart disease Father 31       CABG   Heart attack Father 56   Diverticulitis Father        had surgery   Diabetes Father    Colon polyps Father    Heart disease Maternal Grandfather    Alzheimer's disease Maternal Grandfather    Colon cancer Neg Hx    Rectal cancer Neg Hx    Esophageal cancer Neg Hx    Stomach cancer Neg Hx     Social History   Socioeconomic History   Marital status: Single    Spouse name: Not on file   Number of  children: 0   Years of education: Not on file   Highest education level: Not on file  Occupational History   Occupation: intake speacialists  Tobacco Use   Smoking status: Former    Packs/day: 1.00    Years: 14.00    Pack years: 14.00    Types: Cigarettes    Quit date: 2011    Years since quitting: 11.6   Smokeless tobacco: Never   Tobacco comments:     Nicotine gum quit 04/2017  Vaping Use   Vaping Use: Never used  Substance and Sexual Activity   Alcohol use: Yes    Alcohol/week: 5.0 - 7.0 standard drinks    Types: 5 - 7 Glasses of wine per week   Drug use: No   Sexual activity: Not on file  Other Topics Concern   Not on file  Social History Narrative   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  Physical Exam: Vital signs BP 139/86   Pulse 74   Temp 98 F (36.7 C) (Skin)   Ht '5\' 4"'$  (1.626 m)   Wt 188 lb (85.3 kg)   SpO2 98%   BMI 32.27 kg/m   General:   Alert,  Well-developed, well-nourished, pleasant and cooperative in NAD Lungs:  Clear throughout to auscultation.   Heart:  Regular rate and rhythm; Abdomen:  Soft, nontender and nondistended.    Neuro/Psych:  Alert and cooperative. Normal mood and affect. A and O x 3  Alyssa Mango, MD [facility]

## 2021-02-15 NOTE — Op Note (Signed)
[facility] Patient Name: Alyssa Bartlett Procedure Date: 02/15/2021 10:03 AM MRN: [id number] Endoscopist: Remo Lipps P. Havery Moros , MD Age: 50 Referring MD:  Date of Birth: July 01, 1970 Gender: Female Account #: [id number] Procedure:                Colonoscopy Indications:              High risk colon cancer surveillance: Personal                            history of colonic polyps - difficult to remove                            transverse polyp s/p 2 resections in the past /                            piecemeal, last 2020, here for surveillance Medicines:                Monitored Anesthesia Care Procedure:                Pre-Anesthesia Assessment:                           - Prior to the procedure, a History and Physical                            was performed, and patient medications and                            allergies were reviewed. The patient's tolerance of                            previous anesthesia was also reviewed. The risks                            and benefits of the procedure and the sedation                            options and risks were discussed with the patient.                            All questions were answered, and informed consent                            was obtained. Prior Anticoagulants: The patient has                            taken no previous anticoagulant or antiplatelet                            agents. ASA Grade Assessment: II - A patient with                            mild systemic disease. After reviewing the risks  and benefits, the patient was deemed in                            satisfactory condition to undergo the procedure.                           After obtaining informed consent, the colonoscope                            was passed under direct vision. Throughout the                            procedure, the patient's blood pressure, pulse, and                            oxygen saturations  were monitored continuously. The                            Olympus [id number]) Colonoscope was                            introduced through the anus and advanced to the the                            cecum, identified by appendiceal orifice and                            ileocecal valve. The colonoscopy was performed                            without difficulty. The patient tolerated the                            procedure well. The quality of the bowel                            preparation was good. The ileocecal valve,                            appendiceal orifice, and rectum were photographed. Scope In: 10:05:03 AM Scope Out: 10:30:48 AM Scope Withdrawal Time: 0 hours 22 minutes 54 seconds  Total Procedure Duration: 0 hours 25 minutes 45 seconds  Findings:                 The perianal and digital rectal examinations were                            normal.                           A diminutive polyp was found in the cecum. The                            polyp was sessile. The polyp was removed with a  cold snare. Resection and retrieval were complete.                           A medium post polypectomy scar was found in the                            transverse colon just distal to a tattoo. The scar                            tissue was healthy in appearance. There was no                            evidence of the previous polyp. It was not easily                            seen and multiple passes made in the area                            (angulated turn / tortous)                           A diminutive polyp was found in the recto-sigmoid                            colon. The polyp was sessile. The polyp was removed                            with a cold snare. Resection and retrieval were                            complete.                           The colon was tortuous.                           Internal hemorrhoids were found  during retroflexion.                           The exam was otherwise without abnormality. Complications:            No immediate complications. Estimated blood loss:                            Minimal. Estimated Blood Loss:     Estimated blood loss was minimal. Impression:               - One diminutive polyp in the cecum, removed with a                            cold snare. Resected and retrieved.                           - Post-polypectomy scar in the transverse colon.                           -  One diminutive polyp at the recto-sigmoid colon,                            removed with a cold snare. Resected and retrieved.                           - Tortuous colon.                           - Internal hemorrhoids.                           - The examination was otherwise normal.                           Overall, transverse polypectomy site looks good                            without recurrence. Recommendation:           - Patient has a contact number available for                            emergencies. The signs and symptoms of potential                            delayed complications were discussed with the                            patient. Return to normal activities tomorrow.                            Written discharge instructions were provided to the                            patient.                           - Resume previous diet.                           - Continue present medications.                           - Await pathology results. Remo Lipps P. Havery Moros, MD 02/15/2021 10:36:36 AM This report has been signed electronically.

## 2021-02-17 ENCOUNTER — Telehealth: Payer: Self-pay | Admitting: *Deleted

## 2021-02-17 NOTE — Telephone Encounter (Signed)
  Follow up Call-  Call back number 02/15/2021 06/03/2019 08/20/2018  Post procedure Call Back phone  # [phone]  Permission to leave phone message Yes Yes Yes  Some recent data might be hidden     Patient questions:  Do you have a fever, pain , or abdominal swelling? No. Pain Score  0 *  Have you tolerated food without any problems? Yes.    Have you been able to return to your normal activities? Yes.    Do you have any questions about your discharge instructions: Diet   No. Medications  No. Follow up visit  No.  Do you have questions or concerns about your Care? No.  Actions: * If pain score is 4 or above: No action needed, pain <4.  Have you developed a fever since your procedure? no  2.   Have you had an respiratory symptoms (SOB or cough) since your procedure? no  3.   Have you tested positive for COVID 19 since your procedure no  4.   Have you had any family members/close contacts diagnosed with the COVID 19 since your procedure?  no   If yes to any of these questions please route to Joylene John, RN and Joella Prince, RN

## 2021-04-27 ENCOUNTER — Telehealth: Payer: Self-pay

## 2021-04-27 NOTE — Telephone Encounter (Signed)
PA submitted via CoverMyMeds for omeprazole 40 mg once daily. Key: [id number] BC/BS Member ID: [id number].

## 2021-05-02 NOTE — Telephone Encounter (Signed)
PA for omeprazole 40 mg denied. Per pharmacy, pantoprazole 40mg  may be covered but will require a Prior Auth.  Ok to send pantoprazole 40 mg once daily?

## 2021-05-02 NOTE — Telephone Encounter (Signed)
Thanks Jan.  She can take the omeprazole 20 mg twice daily if she would prefer that, or we can switch her to Protonix 40 mg daily, whichever is easier or her preference.  Thanks

## 2021-05-02 NOTE — Telephone Encounter (Signed)
LM for patient to let us know which she would prefer.  We can send pantoprazole 40 mg once daily or she can take 20 mg BID. She may elect to call her insurance company and see what they will cover better. Patient to call back or send MyChart message

## 2021-05-04 NOTE — Telephone Encounter (Signed)
MyChart message sent to pt

## 2021-05-05 ENCOUNTER — Other Ambulatory Visit: Payer: Self-pay

## 2021-05-05 MED ORDER — OMEPRAZOLE 40 MG PO CPDR: 40.0000 mg | DELAYED_RELEASE_CAPSULE | Freq: Every day | ORAL | 3 refills | Status: DC

## 2021-05-06 ENCOUNTER — Ambulatory Visit: Payer: BC Managed Care – PPO | Admitting: Internal Medicine

## 2021-05-27 ENCOUNTER — Telehealth: Payer: Self-pay | Admitting: Internal Medicine

## 2021-05-27 MED ORDER — SYNTHROID 112 MCG PO TABS: ORAL_TABLET | ORAL | 0 refills | Status: DC

## 2021-05-27 NOTE — Telephone Encounter (Signed)
PT called in regards to refill on the below. SYNTHROID 112 MCG tablet  Please forward to pharmacy:  [facility], [address] AT [address] Phone:  [phone]  Fax:  [phone]    Pt will run out before appt with Dr. Renne Crigler on 06/28/21

## 2021-05-27 NOTE — Telephone Encounter (Signed)
Script sent  

## 2021-05-31 DIAGNOSIS — H40003 Preglaucoma, unspecified, bilateral: Secondary | ICD-10-CM | POA: Diagnosis not present

## 2021-06-28 ENCOUNTER — Encounter: Payer: Self-pay | Admitting: Internal Medicine

## 2021-06-28 ENCOUNTER — Other Ambulatory Visit: Payer: Self-pay

## 2021-06-28 ENCOUNTER — Ambulatory Visit (INDEPENDENT_AMBULATORY_CARE_PROVIDER_SITE_OTHER): Payer: BC Managed Care – PPO | Admitting: Internal Medicine

## 2021-06-28 VITALS — BP 120/78 | HR 82 | Ht 64.0 in | Wt 194.4 lb

## 2021-06-28 DIAGNOSIS — E039 Hypothyroidism, unspecified: Secondary | ICD-10-CM | POA: Diagnosis not present

## 2021-06-28 DIAGNOSIS — E236 Other disorders of pituitary gland: Secondary | ICD-10-CM

## 2021-06-28 DIAGNOSIS — Z8639 Personal history of other endocrine, nutritional and metabolic disease: Secondary | ICD-10-CM

## 2021-06-28 DIAGNOSIS — Z87898 Personal history of other specified conditions: Secondary | ICD-10-CM

## 2021-06-28 LAB — HEMOGLOBIN A1C: Hgb A1c MFr Bld: 6.1 % (ref 4.6–6.5)

## 2021-06-28 LAB — CORTISOL: Cortisol, Plasma: 9.6 ug/dL

## 2021-06-28 LAB — T4, FREE: Free T4: 1.06 ng/dL (ref 0.60–1.60)

## 2021-06-28 NOTE — Progress Notes (Signed)
Patient ID: Alyssa Bartlett, female   DOB: [birth year], 51 y.o.   MRN: [id number]  This visit occurred during the SARS-CoV-2 public health emergency.  Safety protocols were in place, including screening questions prior to the visit, additional usage of staff PPE, and extensive cleaning of exam room while observing appropriate contact time as indicated for disinfecting solutions.   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 Last visit 1 year and 2 months ago.  Interim history: She denies increased fatigue, headaches, visual disturbance. She started a Dexamethasone rinse by dentist  - last dose yesterday or the day before. VF recently normal - checked for glaucoma - ruled out.  Hypothyroidism:  Reviewed history: Pt. has been dx with hypothyroidism in 2011; started Synthroid, then Levothyroxine >> did not feel good (aches, pains, fatigue) she was seeing [place] providers and also saw endocrinology before; was on NatureThroid 48.7 mg bid (~160 mcg LT4 daily) >> was feeling better, weight normalized >> however, had increased anxiety - after an allergic rxn (or Niacin flush - was taking a B complex at that time), worse fatigue, insomnia. She is on Synthroid. Lexapro helped the above sxs.  Pt is on Synthroid 112 mcg daily, taken: - in am - fasting - at least 30 min from b'fast - no calcium - no iron - no multivitamins - + PPIs (Prilosec)-later the day (takes this daily) - not on Biotin  Reviewed patient's TFTs.  We usually follow her by free T4 levels since TSH levels are unreliable for her Lab Results  Component Value Date   TSH 0.05 (L) 05/07/2020   TSH 0.03 (L) 06/17/2019   TSH 0.40 05/03/2018   TSH 0.09 (L) 09/27/2017   TSH 0.01 Repeated and verified X2. 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  - MRI (03/26/2015): The pituitary is enlarged with a fluid level evident on T2 weighted images. Blood products are present in the pituitary compatible with pituitary apoplexy. The gland measures 26 mm cephalo caudad dimension. It measures 19 x 19 mm in cross-sectional dimension. This displaces the optic chiasm superiorly. There is peripheral enhancement about the gland.   - s/p TSR b/c of visual loss (Dr. Kathyrn Sheriff)  - MRI (03/28/2015): Sequelae of recent transsphenoidal pituitary resection are identified. Surgical packing material is present in the posterior nasal cavity. The surgical cavity in the sella/ suprasellar cistern measures approximately 19 x 16 mm and demonstrates uniform peripheral enhancement of 3-4 mm in thickness, with anterior aspect open to the nasal cavity. The pituitary infundibulum is displaced superiorly and slightly rightward by these postoperative changes. No discrete nodular enhancement/residual mass is identified. There is no mass effect on the optic chiasm. There may be mild displacement  of the prechiasmatic optic nerves, left more so than right.   Pituitary labs were normal with the exception of the pituitary-thyroid axis: Component     Latest Ref Rng 03/28/2015 04/09/2015  Cortisol - AM     6.7 - 22.6 ug/dL 16.7   TSH     0.35 - 4.50 uIU/mL  0.15 (L)  Free T4     0.60 - 1.60 ng/dL  1.77 (H)  T3, Free     2.3 - 4.2 pg/mL  3.4  Cortisol,  Plasma       11.5  C206 ACTH     6 - 50 pg/mL  21  Prolactin       5.3  She denies increased thirst or urination  Visual field test at the end of 2016 >> significant improvement in vision after surgery. 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  09/16/2015: MRI pituitary: showed perhaps minimal peripheral residual tumor but otherwise a gross resection, mild sphenoid and right maxillary T2-hyperintense mucous retention cysts, and otherwise nicely patent and well-healed sinusotomies.   Component     Latest Ref Rng 10/28/2015  TSH     0.35 - 4.50 uIU/mL 0.07 (L)  T4,Free(Direct)     0.60 - 1.60 ng/dL 1.16  Prolactin      7.1  Cortisol, Plasma      15.0  LH      2.55  FSH      4.9    MRI (08/2016): Stable residual tumor versus scar tissue, but believed to be more consistent with scar tissue.  Component     Latest Ref Rng & Units 09/27/2017  IGF-I, LC/MS     52 - 328 ng/mL 196  Z-Score (Female)     -2.0 - 2 SD 0.7  TSH     0.35 - 4.50 uIU/mL 0.09 (L)  Prolactin     ng/mL 8.3  LH     mIU/mL 0.96  FSH     mIU/ML 3.4  C206 ACTH     6 - 50 pg/mL 28  Cortisol, Plasma     ug/dL 8.6  Triiodothyronine,Free,Serum     2.3 - 4.2 pg/mL 3.4  T4,Free(Direct)     0.60 - 1.60 ng/dL 0.99    MRI (09/2017): stable  Component     Latest Ref Rng & Units 05/03/2018  IGF-I, LC/MS     52 - 328 ng/mL 147  Z-Score (Female)     -2.0 - 2 SD 0.1  TSH     0.35 - 4.50 uIU/mL 0.40  Prolactin     ng/mL 9.4  LH     mIU/mL 0.76  FSH     mIU/ML 3.0  T4,Free(Direct)     0.60 - 1.60 ng/dL 1.05  Triiodothyronine,Free,Serum     2.3 - 4.2 pg/mL 3.8  Cortisol, Plasma     ug/dL 6.4  C206 ACTH     6 - 50 pg/mL 21   Component     Latest Ref Rng & Units 06/17/2019  IGF-I, LC/MS     52 - 328 ng/mL 135  Z-Score (Female)     -2.0 - 2 SD -0.1  C206 ACTH     6 - 50 pg/mL 21  Cortisol, Plasma     ug/dL 12.2  LH     mIU/mL 10.53  FSH     mIU/ML 5.5   T4,Free(Direct)     0.60 - 1.60 ng/dL 1.03  TSH     0.35 - 4.50 uIU/mL  0.03 (L)   MRI (09/04/2019):   1. Continued stable postoperative appearance of the pituitary and regional soft tissues.  2. Otherwise normal MRI appearance of the brain.  Menstrual cycles were regular, but lighter, at last visit.  No headaches or blurry vision.  She also has a history of prediabetes: Lab Results  Component Value Date   HGBA1C 6.0 05/07/2020   HGBA1C 5.8 06/17/2019   HGBA1C 5.5 03/12/2015   She has a history of mild hyperlipidemia, but latest lipids were at goal: Lab Results  Component Value Date   CHOL 163 03/12/2015   HDL 58.30 03/12/2015   LDLCALC 89 03/12/2015   TRIG 79.0 03/12/2015   CHOLHDL 3 03/12/2015    ROS: + see HPI  I reviewed pt's medications, allergies, PMH, social hx, family hx, and changes were documented in the history of present illness. Otherwise, unchanged from my initial visit note.  Past Medical History  Diagnosis Date   Hypothyroidism     Mood disorder ([place])     GAD, panic, depression   History of chicken pox    Allergic reaction to food     ? shellfish   Prediabetes    Mild hyperlipidemia    Tobacco use    Past Surgical History:  Procedure Laterality Date   COLONOSCOPY     CRANIOTOMY N/A 03/26/2015   Procedure: CRANIOTOMY HYPOPHYSECTOMY TRANSNASAL APPROACH;  Surgeon: Consuella Lose, MD;  Location: [facility];  Service: Neurosurgery;  Laterality: N/A;   POLYPECTOMY     SINUS ENDO W/FUSION N/A 03/26/2015   Procedure: ENDOSCOPIC SINUS SURGERY WITH NAVIGATION;  Surgeon: Ruby Cola, MD;  Location: [facility];  Service: ENT;  Laterality: N/A;   History   Social History   Marital Status: Single    Spouse Name: N/A   Number of Children: 0   Occupational History   intake speacialists    Social History Main Topics   Smoking status: Former Smoker -- 1.00 packs/day for 14 years    Types: Cigarettes    Start date: 07/20/2008   Smokeless  tobacco: Not on file   Alcohol Use: Yes     Comment: wine 2-3 drinks 4-5x a week   Drug Use: No   Social History Narrative   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     escitalopram (LEXAPRO) 20 MG tablet Take 20 mg by mouth at bedtime.     LORazepam (ATIVAN) 0.5 MG tablet Take 0.5 mg by mouth 2 (two) times daily. Take 1 tablet in the morning and half tablet in the evening     omeprazole (PRILOSEC) 40 MG capsule Take 1 capsule (40 mg total) by mouth daily. 90 capsule 3   sodium chloride (OCEAN) 0.65 % SOLN nasal spray Place 1 spray into both nostrils as needed for congestion. (Patient not taking: Reported on 02/15/2021)     SYNTHROID 112 MCG tablet Take 1 tablet daily before breakfast 90 tablet 0   Current Facility-Administered Medications on File Prior to Visit  Medication Dose Route Frequency Provider Last Rate Last Admin  0.9 %  sodium chloride infusion  500 mL Intravenous Once Armbruster, Carlota Raspberry, MD       No Known Allergies Family History  Problem Relation Age of Onset   Allergic rhinitis Mother    Hyperlipidemia Mother    Diabetes Mother    Allergic rhinitis Father    Prostate cancer Father    Hyperlipidemia Father    Heart disease Father 12       CABG   Heart attack Father 93   Diverticulitis Father        had surgery   Diabetes Father    Colon polyps Father    Heart disease Maternal Grandfather    Alzheimer's disease Maternal Grandfather    Colon cancer Neg Hx    Rectal cancer Neg Hx    Esophageal cancer Neg Hx    Stomach cancer Neg Hx   HTN in M and F  PE: BP 120/78 (BP Location: Right Arm, Patient Position:  Sitting, Cuff Size: Normal)    Pulse 82    Ht 5\' 4"  (1.626 m)    Wt 194 lb 6.4 oz (88.2 kg)    SpO2 95%    BMI 33.37 kg/m  Wt Readings from Last 3 Encounters:  06/28/21 194 lb 6.4 oz (88.2 kg)  02/15/21 188 lb (85.3 kg)  11/08/20 188 lb 8 oz (85.5 kg)   Constitutional: overweight, in NAD Eyes: PERRLA, EOMI, no exophthalmos ENT: moist mucous membranes, no thyromegaly, no cervical lymphadenopathy Cardiovascular: RRR, No MRG Respiratory: CTA B Musculoskeletal: no deformities, strength intact in all 4 Skin: moist, warm, no rashes Neurological: no tremor with outstretched hands, DTR normal in all 4  ASSESSMENT: 1.  Central hypothyroidism  01/01/2015: Thyroid U/S:  Right thyroid lobe: 3.3 cm x 0.8 cm x 0.9 cm. Small hypoechoic nodule at the inferior right thyroid measures approximately 3 mm. Heterogeneous appearance of the thyroid tissue Left thyroid lobe: 3.1 cm x 0.6 cm x 0.8 cm. No nodules. Heterogeneous appearance of the thyroid tissue. Isthmus Thickness: 3 mm.  No nodules visualized. Lymphadenopathy: None visualized. IMPRESSION: Relatively unremarkable appearance of the thyroid with single small benign-appearing nodule on the right. No left-sided nodules.  2. H/o Pituitary tumor with apoplexy  3. H/o prediabetes  PLAN:  1. Patient with longstanding hypothyroidism, previously on Nature-Throid, but now on Synthroid.  We are following her by free T4 levels since she most likely has central hypothyroidism and TSH levels that are unreliable - latest thyroid labs reviewed with pt. >> normal free T4 - she continues on LT4 (Synthroid d.a.w.) 112 mcg daily - pt feels good on this dose. - we discussed about taking the thyroid hormone every day, with water, >30 minutes before breakfast, separated by >4 hours from acid reflux medications, calcium, iron, multivitamins. Pt. is taking it correctly. - will check her free T4 again today (she did  not take her Synthroid today) - If labs are  abnormal, she will need to return for repeat a free T4 level in 1.5 months  2. H/o Pituitary apoplexy -She is status post transsphenoidal resection of pituitary tumor after an episode of hemorrhage.  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  Her visual capacity normalized after surgery.  Visual field greatly improved in 2017.  She is followed by neurosurgery, Dr. Kathyrn Sheriff-  MRI in 09/2017 showed stable scar tissue versus recurrence in the sella, but it was felt that this was most likely scar tissue.  On the latest MRI  from 08/2019, there was no suspicious mass. A recent VF was intact. She has a new pituitary MRI planned by neuroS[facility]. -We checked her pituitary hormones after the surgery and they remained normal except for a low TSH, which most likely implies central hypothyroidism  -Reviewed her pituitary lab results from 05/2019 and they were all normal with the exception of the low TSH -We will repeat her pituitary labs now and likely in 3-5 years afterwards -I will see her back in 1 year  3. H/o Prediabetes -Before last visit, she gained approximately 50 pounds in the previous 10 years. -Weight is stable since then -At last visit, HbA1c was 6.0%, slightly higher -At that time, we discussed about improving diet: Decreasing sweets and cutting out dairy -she gained 6 lbs since last OV  Needs to lay down during lab draw, also use smallest needle.   Component     Latest Ref Rng & Units 06/28/2021  IGF-I, LC/MS     50 - 317 ng/mL 126  Z-Score (Female)     -2.0 - 2.0 SD -0.2  T4,Free(Direct)     0.60 - 1.60 ng/dL 1.06  Prolactin     ng/mL 9.0  C206 ACTH     6 - 50 pg/mL 31  Cortisol, Plasma     ug/dL 9.6  Hemoglobin A1C     4.6 - 6.5 % 6.1  Growth Hormone     <OR = 7.1 ng/mL 0.1  Normal labs.  However, HbA1c slightly higher.  Philemon Kingdom, MD PhD [facility] Endocrinology

## 2021-06-28 NOTE — Patient Instructions (Signed)
Please stop at the lab.  Please continue Synthroid 112 mcg daily.  Take the thyroid hormone every day, with water, at least 30 minutes before breakfast, separated by at least 4 hours from: - acid reflux medications - calcium - iron - multivitamins  Please come back for a follow-up appointment in 1 year. 

## 2021-07-03 LAB — GROWTH HORMONE: Growth Hormone: 0.1 ng/mL (ref <=7.1)

## 2021-07-03 LAB — INSULIN-LIKE GROWTH FACTOR
IGF-I, LC/MS: 126 ng/mL (ref 50–317)
Z-Score (Female): -0.2 SD (ref ?–2.0)

## 2021-07-03 LAB — ACTH: C206 ACTH: 31 pg/mL (ref 6–50)

## 2021-07-03 LAB — PROLACTIN: Prolactin: 9 ng/mL

## 2021-08-28 ENCOUNTER — Other Ambulatory Visit: Payer: Self-pay | Admitting: Internal Medicine

## 2021-11-10 DIAGNOSIS — Z6833 Body mass index (BMI) 33.0-33.9, adult: Secondary | ICD-10-CM | POA: Diagnosis not present

## 2021-11-10 DIAGNOSIS — E236 Other disorders of pituitary gland: Secondary | ICD-10-CM | POA: Diagnosis not present

## 2021-11-10 DIAGNOSIS — D352 Benign neoplasm of pituitary gland: Secondary | ICD-10-CM | POA: Diagnosis not present

## 2021-11-15 DIAGNOSIS — Z124 Encounter for screening for malignant neoplasm of cervix: Secondary | ICD-10-CM | POA: Diagnosis not present

## 2021-11-15 DIAGNOSIS — Z6833 Body mass index (BMI) 33.0-33.9, adult: Secondary | ICD-10-CM | POA: Diagnosis not present

## 2021-11-15 DIAGNOSIS — Z01419 Encounter for gynecological examination (general) (routine) without abnormal findings: Secondary | ICD-10-CM | POA: Diagnosis not present

## 2021-11-15 DIAGNOSIS — N760 Acute vaginitis: Secondary | ICD-10-CM | POA: Diagnosis not present

## 2021-11-15 DIAGNOSIS — Z1151 Encounter for screening for human papillomavirus (HPV): Secondary | ICD-10-CM | POA: Diagnosis not present

## 2021-11-15 DIAGNOSIS — Z1231 Encounter for screening mammogram for malignant neoplasm of breast: Secondary | ICD-10-CM | POA: Diagnosis not present

## 2021-11-17 ENCOUNTER — Other Ambulatory Visit: Payer: Self-pay | Admitting: Obstetrics and Gynecology

## 2021-11-17 DIAGNOSIS — R928 Other abnormal and inconclusive findings on diagnostic imaging of breast: Secondary | ICD-10-CM

## 2021-11-18 ENCOUNTER — Ambulatory Visit (INDEPENDENT_AMBULATORY_CARE_PROVIDER_SITE_OTHER): Payer: BC Managed Care – PPO | Admitting: Family Medicine

## 2021-11-18 ENCOUNTER — Encounter: Payer: Self-pay | Admitting: Family Medicine

## 2021-11-18 ENCOUNTER — Ambulatory Visit: Payer: BC Managed Care – PPO | Admitting: Family Medicine

## 2021-11-18 ENCOUNTER — Encounter: Payer: Self-pay | Admitting: Internal Medicine

## 2021-11-18 ENCOUNTER — Ambulatory Visit
Admission: RE | Admit: 2021-11-18 | Discharge: 2021-11-18 | Disposition: A | Discharge status: Home / Self Care | Payer: BC Managed Care – PPO | Source: Ambulatory Visit | Attending: Obstetrics and Gynecology | Admitting: Obstetrics and Gynecology

## 2021-11-18 ENCOUNTER — Ambulatory Visit: Admission: RE | Admit: 2021-11-18 | Payer: BC Managed Care – PPO | Source: Ambulatory Visit

## 2021-11-18 VITALS — BP 134/86 | HR 82 | Temp 97.7°F | Ht 64.0 in | Wt 195.0 lb

## 2021-11-18 DIAGNOSIS — Z1159 Encounter for screening for other viral diseases: Secondary | ICD-10-CM

## 2021-11-18 DIAGNOSIS — F411 Generalized anxiety disorder: Secondary | ICD-10-CM

## 2021-11-18 DIAGNOSIS — R7303 Prediabetes: Secondary | ICD-10-CM | POA: Diagnosis not present

## 2021-11-18 DIAGNOSIS — E669 Obesity, unspecified: Secondary | ICD-10-CM | POA: Insufficient documentation

## 2021-11-18 DIAGNOSIS — E039 Hypothyroidism, unspecified: Secondary | ICD-10-CM

## 2021-11-18 DIAGNOSIS — K219 Gastro-esophageal reflux disease without esophagitis: Secondary | ICD-10-CM

## 2021-11-18 DIAGNOSIS — R928 Other abnormal and inconclusive findings on diagnostic imaging of breast: Secondary | ICD-10-CM | POA: Diagnosis not present

## 2021-11-18 DIAGNOSIS — Z0000 Encounter for general adult medical examination without abnormal findings: Secondary | ICD-10-CM

## 2021-11-18 LAB — COMPREHENSIVE METABOLIC PANEL
ALT: 33 U/L (ref 0–35)
AST: 30 U/L (ref 0–37)
Albumin: 4.4 g/dL (ref 3.5–5.2)
Alkaline Phosphatase: 79 U/L (ref 39–117)
BUN: 17 mg/dL (ref 6–23)
CO2: 28 mEq/L (ref 19–32)
Calcium: 9.6 mg/dL (ref 8.4–10.5)
Chloride: 103 mEq/L (ref 96–112)
Creatinine, Ser: 0.7 mg/dL (ref 0.40–1.20)
GFR: 100.37 mL/min (ref >60.00)
Glucose, Bld: 97 mg/dL (ref 70–99)
Potassium: 4.4 mEq/L (ref 3.5–5.1)
Sodium: 139 mEq/L (ref 135–145)
Total Bilirubin: 0.3 mg/dL (ref 0.2–1.2)
Total Protein: 7.4 g/dL (ref 6.0–8.3)

## 2021-11-18 LAB — CBC WITH DIFFERENTIAL/PLATELET
Basophils Absolute: 0 10*3/uL (ref 0.0–0.1)
Basophils Relative: 0.6 % (ref 0.0–3.0)
Eosinophils Absolute: 0.1 10*3/uL (ref 0.0–0.7)
Eosinophils Relative: 1.5 % (ref 0.0–5.0)
HCT: 40.5 % (ref 36.0–46.0)
Hemoglobin: 13.6 g/dL (ref 12.0–15.0)
Lymphocytes Relative: 21.9 % (ref 12.0–46.0)
Lymphs Abs: 1.6 10*3/uL (ref 0.7–4.0)
MCHC: 33.5 g/dL (ref 30.0–36.0)
MCV: 90.8 fl (ref 78.0–100.0)
Monocytes Absolute: 0.4 10*3/uL (ref 0.1–1.0)
Monocytes Relative: 5.8 % (ref 3.0–12.0)
Neutro Abs: 5 10*3/uL (ref 1.4–7.7)
Neutrophils Relative %: 70.2 % (ref 43.0–77.0)
Platelets: 240 10*3/uL (ref 150.0–400.0)
RBC: 4.46 Mil/uL (ref 3.87–5.11)
RDW: 13.5 % (ref 11.5–15.5)
WBC: 7.2 10*3/uL (ref 4.0–10.5)

## 2021-11-18 LAB — LIPID PANEL
Cholesterol: 230 mg/dL — ABNORMAL HIGH (ref 0–200)
HDL: 61.3 mg/dL (ref >39.00)
LDL Cholesterol: 139 mg/dL — ABNORMAL HIGH (ref 0–99)
NonHDL: 168.52
Total CHOL/HDL Ratio: 4
Triglycerides: 146 mg/dL (ref 0.0–149.0)
VLDL: 29.2 mg/dL (ref 0.0–40.0)

## 2021-11-18 NOTE — Assessment & Plan Note (Addendum)
She is a pleasant 51 year old female who is new to the practice and here today for fasting CPE.  Preventive health care reviewed.  She sees her OB/GYN and is up-to-date on mammogram and Pap smear.  Up-to-date on colonoscopy as well.  Counseling on healthy lifestyle including diet and exercise.  Immunizations reviewed.  She may follow-up for Shingrix vaccine at her convenience or get this at her pharmacy.  Encouraged her to check with her insurance regarding this vaccine. Follow-up pending lab results

## 2021-11-18 NOTE — Assessment & Plan Note (Signed)
Avoid triggers.  Follow-up with GI as needed

## 2021-11-18 NOTE — Assessment & Plan Note (Signed)
Done per screening guidelines 

## 2021-11-18 NOTE — Patient Instructions (Signed)
Preventive Care 51-51 Years Old, Female Preventive care refers to lifestyle choices and visits with your health care provider that can promote health and wellness. Preventive care visits are also called wellness exams. What can I expect for my preventive care visit? Counseling Your health care provider may ask you questions about your: Medical history, including: Past medical problems. Family medical history. Pregnancy history. Current health, including: Menstrual cycle. Method of birth control. Emotional well-being. Home life and relationship well-being. Sexual activity and sexual health. Lifestyle, including: Alcohol, nicotine or tobacco, and drug use. Access to firearms. Diet, exercise, and sleep habits. Work and work environment. Sunscreen use. Safety issues such as seatbelt and bike helmet use. Physical exam Your health care provider will check your: Height and weight. These may be used to calculate your BMI (body mass index). BMI is a measurement that tells if you are at a healthy weight. Waist circumference. This measures the distance around your waistline. This measurement also tells if you are at a healthy weight and may help predict your risk of certain diseases, such as type 2 diabetes and high blood pressure. Heart rate and blood pressure. Body temperature. Skin for abnormal spots. What immunizations do I need?  Vaccines are usually given at various ages, according to a schedule. Your health care provider will recommend vaccines for you based on your age, medical history, and lifestyle or other factors, such as travel or where you work. What tests do I need? Screening Your health care provider may recommend screening tests for certain conditions. This may include: Lipid and cholesterol levels. Diabetes screening. This is done by checking your blood sugar (glucose) after you have not eaten for a while (fasting). Pelvic exam and Pap test. Hepatitis B test. Hepatitis C  test. HIV (human immunodeficiency virus) test. STI (sexually transmitted infection) testing, if you are at risk. Lung cancer screening. Colorectal cancer screening. Mammogram. Talk with your health care provider about when you should start having regular mammograms. This may depend on whether you have a family history of breast cancer. BRCA-related cancer screening. This may be done if you have a family history of breast, ovarian, tubal, or peritoneal cancers. Bone density scan. This is done to screen for osteoporosis. Talk with your health care provider about your test results, treatment options, and if necessary, the need for more tests. Follow these instructions at home: Eating and drinking  Eat a diet that includes fresh fruits and vegetables, whole grains, lean protein, and low-fat dairy products. Take vitamin and mineral supplements as recommended by your health care provider. Do not drink alcohol if: Your health care provider tells you not to drink. You are pregnant, may be pregnant, or are planning to become pregnant. If you drink alcohol: Limit how much you have to 0-1 drink a day. Know how much alcohol is in your drink. In the [place] one drink equals one 12 oz bottle of beer (355 mL), one 5 oz glass of wine (148 mL), or one 1 oz glass of hard liquor (44 mL). Lifestyle Brush your teeth every morning and night with fluoride toothpaste. Floss one time each day. Exercise for at least 30 minutes 5 or more days each week. Do not use any products that contain nicotine or tobacco. These products include cigarettes, chewing tobacco, and vaping devices, such as e-cigarettes. If you need help quitting, ask your health care provider. Do not use drugs. If you are sexually active, practice safe sex. Use a condom or other form of protection to   prevent STIs. If you do not wish to become pregnant, use a form of birth control. If you plan to become pregnant, see your health care provider for a  prepregnancy visit. Take aspirin only as told by your health care provider. Make sure that you understand how much to take and what form to take. Work with your health care provider to find out whether it is safe and beneficial for you to take aspirin daily. Find healthy ways to manage stress, such as: Meditation, yoga, or listening to music. Journaling. Talking to a trusted person. Spending time with friends and family. Minimize exposure to UV radiation to reduce your risk of skin cancer. Safety Always wear your seat belt while driving or riding in a vehicle. Do not drive: If you have been drinking alcohol. Do not ride with someone who has been drinking. When you are tired or distracted. While texting. If you have been using any mind-altering substances or drugs. Wear a helmet and other protective equipment during sports activities. If you have firearms in your house, make sure you follow all gun safety procedures. Seek help if you have been physically or sexually abused. What's next? Visit your health care provider once a year for an annual wellness visit. Ask your health care provider how often you should have your eyes and teeth checked. Stay up to date on all vaccines. This information is not intended to replace advice given to you by your health care provider. Make sure you discuss any questions you have with your health care provider. Document Revised: 12/01/2020 Document Reviewed: 12/01/2020 Elsevier Patient Education  [place].

## 2021-11-18 NOTE — Progress Notes (Signed)
New Patient Office Visit  Subjective    Patient ID: Alyssa Bartlett, female    DOB: 1970-12-05  Age: 51 y.o. MRN: [id number]  CC:  Chief Complaint  Patient presents with   Establish Care    Would like a physical, labs (is fasting) and discuss weight loss    HPI [name] presents to establish care and for a fasting CPE.   Previous PCP was years ago: [facility] - Dr. Maudie Mercury   Other providers:  OB/GYN - Pap smear and mammogram this week  GI- Dr. Havery Moros. Colonoscopy 2022. She is due for 3 year recall. Also treating her for GERD Dr. Renne Crigler - hypothyroidism, prediabetes, and pituitary tumor Eye doctor- [facility] Dentist - regular visits Dermatologist - visit at the end of the month. [place]  Psychiatrist - Dr. Baird Cancer  for Panic disorder and GAD. Taking Lexapro daily and Ativan daily and Dr. Baird Cancer is prescribing these medications.     She is in a long term relationship. No kids. She does have pets.  Works for [employer]     Outpatient Encounter Medications as of 11/18/2021  Medication Sig   dexamethasone 0.5 MG/5ML elixir Take by mouth.   escitalopram (LEXAPRO) 20 MG tablet Take 20 mg by mouth at bedtime.   LORazepam (ATIVAN) 0.5 MG tablet Take 0.5 mg by mouth 2 (two) times daily. Take 1 tablet in the morning and half tablet in the evening   omeprazole (PRILOSEC) 40 MG capsule Take 1 capsule (40 mg total) by mouth daily.   Omeprazole Magnesium (PRILOSEC) 10 MG PACK Prilosec 10 mg oral suspension,delayed release  Take 2 packets every day by oral route.   sodium chloride (OCEAN) 0.65 % SOLN nasal spray Place 1 spray into both nostrils as needed for congestion.   SYNTHROID 112 MCG tablet TAKE 1 TABLET BY MOUTH DAILY BEFORE BREAKFAST.   metroNIDAZOLE (FLAGYL) 500 MG tablet metronidazole 500 mg tablet  Take 1 tablet twice a day by oral route for 7 days. (Patient not taking: Reported on 11/18/2021)   Facility-Administered Encounter Medications as of 11/18/2021  Medication    0.9 %  sodium chloride infusion    Past Medical History:  Diagnosis Date   Allergic reaction to food    ? shellfish   Anxiety    GERD (gastroesophageal reflux disease)    Hemorrhoid 09/17/2015   History of chicken pox    Hypothyroidism    managed by [facility]   Mild hyperlipidemia    no meds   Mood disorder (HCC)    GAD, panic, depression   Prediabetes    Tobacco use     Past Surgical History:  Procedure Laterality Date   COLONOSCOPY     CRANIOTOMY N/A 03/26/2015   Procedure: CRANIOTOMY HYPOPHYSECTOMY TRANSNASAL APPROACH;  Surgeon: Consuella Lose, MD;  Location: [facility];  Service: Neurosurgery;  Laterality: N/A;   POLYPECTOMY     SINUS ENDO W/FUSION N/A 03/26/2015   Procedure: ENDOSCOPIC SINUS SURGERY WITH NAVIGATION;  Surgeon: Ruby Cola, MD;  Location: [facility];  Service: ENT;  Laterality: N/A;    Family History  Problem Relation Age of Onset   Allergic rhinitis Mother    Hyperlipidemia Mother    Diabetes Mother    Arthritis Mother    High blood pressure Mother    Allergic rhinitis Father    Prostate cancer Father    Hyperlipidemia Father    Heart disease Father 86  CABG   Heart attack Father 10   Diverticulitis Father        had surgery   Diabetes Father    Colon polyps Father    Arthritis Father    High blood pressure Father    Heart disease Maternal Grandfather    Alzheimer's disease Maternal Grandfather    Colon cancer Neg Hx    Rectal cancer Neg Hx    Esophageal cancer Neg Hx    Stomach cancer Neg Hx     Social History   Socioeconomic History   Marital status: Single    Spouse name: Not on file   Number of children: 0   Years of education: Not on file   Highest education level: Not on file  Occupational History   Occupation: intake speacialists  Tobacco Use   Smoking status: Former    Packs/day: 1.00    Years: 14.00    Pack years: 14.00    Types: Cigarettes    Quit date: 2011    Years since  quitting: 12.4   Smokeless tobacco: Never   Tobacco comments:     Nicotine gum quit 04/2017  Vaping Use   Vaping Use: Never used  Substance and Sexual Activity   Alcohol use: Yes    Alcohol/week: 5.0 - 7.0 standard drinks    Types: 5 - 7 Glasses of wine per week   Drug use: No   Sexual activity: Yes  Other Topics Concern   Not on file  Social History Narrative   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      Home Situation:lives with boyfriend      Spiritual Beliefs: none, but feels spiritually connected to nature and feels better outside      Lifestyle: yoga a few times per week; diet is pretty good      Social Determinants of Radio broadcast assistant Strain: Not on file  Food Insecurity: Not on file  Transportation Needs: Not on file  Physical Activity: Not on file  Stress: Not on file  Social Connections: Not on file  Intimate Partner Violence: Not on file    ROS Review of Systems Constitutional: -fever, -chills, -sweats, -unexpected weight change,-fatigue ENT: -runny nose, -ear pain, -sore throat Cardiology:  -chest pain, -palpitations, -edema Respiratory: -cough, -shortness of breath, -wheezing Gastroenterology: -abdominal pain, -nausea, -vomiting, -diarrhea, -constipation  Hematology: -bleeding or bruising problems Musculoskeletal: -arthralgias, -myalgias, -joint swelling, -back pain Ophthalmology: -vision changes Urology: -dysuria, -difficulty urinating, -hematuria, -urinary frequency, -urgency Neurology: -headache, -weakness, -tingling, -numbness        Objective    BP 134/86 (BP Location: Left Arm, Patient Position: Sitting, Cuff Size: Large)   Pulse 82   Temp 97.7 F (36.5 C) (Temporal)   Ht '5\' 4"'$  (1.626 m)   Wt 195 lb (88.5 kg)   LMP 06/30/2020   SpO2 98%   BMI 33.47 kg/m   Physical Exam Constitutional:      General: She is not in acute distress.    Appearance: Normal appearance. She is not ill-appearing.  HENT:     Right Ear: Tympanic  membrane and ear canal normal.     Left Ear: Tympanic membrane and ear canal normal.     Nose: Nose normal.     Mouth/Throat:     Mouth: Mucous membranes are moist.  Eyes:     Extraocular Movements: Extraocular movements intact.     Conjunctiva/sclera: Conjunctivae normal.     Pupils: Pupils are equal, round, and reactive to light.  Cardiovascular:     Rate and Rhythm: Normal rate and regular rhythm.     Pulses: Normal pulses.  Pulmonary:     Effort: Pulmonary effort is normal.     Breath sounds: Normal breath sounds.  Abdominal:     General: Bowel sounds are normal. There is no distension.     Palpations: Abdomen is soft.     Tenderness: There is no abdominal tenderness. There is no guarding or rebound.  Musculoskeletal:        General: Normal range of motion.     Cervical back: Normal range of motion and neck supple.  Skin:    General: Skin is warm and dry.     Capillary Refill: Capillary refill takes less than 2 seconds.  Neurological:     General: No focal deficit present.     Mental Status: She is alert and oriented to person, place, and time.     Cranial Nerves: No cranial nerve deficit.     Sensory: No sensory deficit.     Motor: No weakness.     Coordination: Coordination normal.  Psychiatric:        Mood and Affect: Mood normal.        Behavior: Behavior normal.        Thought Content: Thought content normal.        Assessment & Plan:   Problem List Items Addressed This Visit       Digestive   GERD (gastroesophageal reflux disease)    Avoid triggers.  Follow-up with GI as needed       Relevant Medications   Omeprazole Magnesium (PRILOSEC) 10 MG PACK     Endocrine   Hypothyroidism (Chronic)    Managed by endocrinologist         Other   GAD (generalized anxiety disorder)    Doing well on current medication regimen prescribed by psychiatrist       Need for hepatitis C screening test    Done per screening guidelines       Relevant Orders    Hepatitis C antibody   Obesity (BMI 30-39.9)    Recommend increasing physical activity and getting at least 150 minutes of vigorous exercise each week.  Limit sugar and carbohydrates.  We can discuss this further at her next visit       Relevant Orders   Comprehensive metabolic panel (Completed)   Lipid panel (Completed)   Prediabetes    Limit sugar and carbohydrates and increase physical activity       Relevant Orders   Comprehensive metabolic panel (Completed)   Routine general medical examination at a health care facility - Primary    She is a pleasant 51 year old female who is new to the practice and here today for fasting CPE.  Preventive health care reviewed.  She sees her OB/GYN and is up-to-date on mammogram and Pap smear.  Up-to-date on colonoscopy as well.  Counseling on healthy lifestyle including diet and exercise.  Immunizations reviewed.  She may follow-up for Shingrix vaccine at her convenience or get this at her pharmacy.  Encouraged her to check with her insurance regarding this vaccine. Follow-up pending lab results       Relevant Orders   CBC with Differential/Platelet (Completed)   Comprehensive metabolic panel (Completed)   Lipid panel (Completed)    Return in about 1 year (around 11/19/2022).   Harland Dingwall, NP-C

## 2021-11-18 NOTE — Assessment & Plan Note (Signed)
Limit sugar and carbohydrates and increase physical activity

## 2021-11-18 NOTE — Assessment & Plan Note (Signed)
Recommend increasing physical activity and getting at least 150 minutes of vigorous exercise each week.  Limit sugar and carbohydrates.  We can discuss this further at her next visit

## 2021-11-18 NOTE — Assessment & Plan Note (Signed)
Managed by endocrinologist

## 2021-11-18 NOTE — Assessment & Plan Note (Signed)
Doing well on current medication regimen prescribed by psychiatrist

## 2021-11-21 LAB — HEPATITIS C ANTIBODY
Hepatitis C Ab: NONREACTIVE
SIGNAL TO CUT-OFF: 0.05 (ref <1.00)

## 2021-11-23 ENCOUNTER — Other Ambulatory Visit: Payer: Self-pay | Admitting: Internal Medicine

## 2021-12-15 DIAGNOSIS — D485 Neoplasm of uncertain behavior of skin: Secondary | ICD-10-CM | POA: Diagnosis not present

## 2021-12-15 DIAGNOSIS — L814 Other melanin hyperpigmentation: Secondary | ICD-10-CM | POA: Diagnosis not present

## 2021-12-15 DIAGNOSIS — B360 Pityriasis versicolor: Secondary | ICD-10-CM | POA: Diagnosis not present

## 2021-12-15 DIAGNOSIS — D225 Melanocytic nevi of trunk: Secondary | ICD-10-CM | POA: Diagnosis not present

## 2021-12-15 DIAGNOSIS — L821 Other seborrheic keratosis: Secondary | ICD-10-CM | POA: Diagnosis not present

## 2021-12-15 DIAGNOSIS — D2371 Other benign neoplasm of skin of right lower limb, including hip: Secondary | ICD-10-CM | POA: Diagnosis not present

## 2021-12-17 DIAGNOSIS — K5792 Diverticulitis of intestine, part unspecified, without perforation or abscess without bleeding: Secondary | ICD-10-CM

## 2021-12-17 HISTORY — DX: Diverticulitis of intestine, part unspecified, without perforation or abscess without bleeding: K57.92

## 2022-01-02 ENCOUNTER — Encounter: Payer: Self-pay | Admitting: Family Medicine

## 2022-01-02 ENCOUNTER — Ambulatory Visit (INDEPENDENT_AMBULATORY_CARE_PROVIDER_SITE_OTHER): Payer: BC Managed Care – PPO | Admitting: Family Medicine

## 2022-01-02 ENCOUNTER — Ambulatory Visit
Admission: RE | Admit: 2022-01-02 | Discharge: 2022-01-02 | Disposition: A | Discharge status: Home / Self Care | Payer: BC Managed Care – PPO | Source: Ambulatory Visit | Attending: Family Medicine | Admitting: Family Medicine

## 2022-01-02 VITALS — BP 122/84 | HR 91 | Temp 98.0°F | Wt 190.0 lb

## 2022-01-02 DIAGNOSIS — R1032 Left lower quadrant pain: Secondary | ICD-10-CM | POA: Diagnosis not present

## 2022-01-02 DIAGNOSIS — K6389 Other specified diseases of intestine: Secondary | ICD-10-CM | POA: Diagnosis not present

## 2022-01-02 DIAGNOSIS — R509 Fever, unspecified: Secondary | ICD-10-CM

## 2022-01-02 DIAGNOSIS — R10824 Left lower quadrant rebound abdominal tenderness: Secondary | ICD-10-CM | POA: Diagnosis not present

## 2022-01-02 DIAGNOSIS — R198 Other specified symptoms and signs involving the digestive system and abdomen: Secondary | ICD-10-CM | POA: Insufficient documentation

## 2022-01-02 DIAGNOSIS — K5732 Diverticulitis of large intestine without perforation or abscess without bleeding: Secondary | ICD-10-CM

## 2022-01-02 DIAGNOSIS — K449 Diaphragmatic hernia without obstruction or gangrene: Secondary | ICD-10-CM | POA: Diagnosis not present

## 2022-01-02 DIAGNOSIS — K314 Gastric diverticulum: Secondary | ICD-10-CM | POA: Diagnosis not present

## 2022-01-02 LAB — COMPREHENSIVE METABOLIC PANEL
ALT: 34 U/L (ref 0–35)
AST: 33 U/L (ref 0–37)
Albumin: 4.5 g/dL (ref 3.5–5.2)
Alkaline Phosphatase: 77 U/L (ref 39–117)
BUN: 11 mg/dL (ref 6–23)
CO2: 28 mEq/L (ref 19–32)
Calcium: 9.5 mg/dL (ref 8.4–10.5)
Chloride: 100 mEq/L (ref 96–112)
Creatinine, Ser: 0.67 mg/dL (ref 0.40–1.20)
GFR: 101.34 mL/min (ref >60.00)
Glucose, Bld: 96 mg/dL (ref 70–99)
Potassium: 4.2 mEq/L (ref 3.5–5.1)
Sodium: 139 mEq/L (ref 135–145)
Total Bilirubin: 0.6 mg/dL (ref 0.2–1.2)
Total Protein: 7.8 g/dL (ref 6.0–8.3)

## 2022-01-02 LAB — CBC WITH DIFFERENTIAL/PLATELET
Basophils Absolute: 0.1 10*3/uL (ref 0.0–0.1)
Basophils Relative: 0.7 % (ref 0.0–3.0)
Eosinophils Absolute: 0.1 10*3/uL (ref 0.0–0.7)
Eosinophils Relative: 1 % (ref 0.0–5.0)
HCT: 40.6 % (ref 36.0–46.0)
Hemoglobin: 13.6 g/dL (ref 12.0–15.0)
Lymphocytes Relative: 21.6 % (ref 12.0–46.0)
Lymphs Abs: 2 10*3/uL (ref 0.7–4.0)
MCHC: 33.5 g/dL (ref 30.0–36.0)
MCV: 90.4 fl (ref 78.0–100.0)
Monocytes Absolute: 0.7 10*3/uL (ref 0.1–1.0)
Monocytes Relative: 7.7 % (ref 3.0–12.0)
Neutro Abs: 6.3 10*3/uL (ref 1.4–7.7)
Neutrophils Relative %: 69 % (ref 43.0–77.0)
Platelets: 298 10*3/uL (ref 150.0–400.0)
RBC: 4.49 Mil/uL (ref 3.87–5.11)
RDW: 13.2 % (ref 11.5–15.5)
WBC: 9.1 10*3/uL (ref 4.0–10.5)

## 2022-01-02 LAB — POCT URINALYSIS DIPSTICK
Glucose, UA: NEGATIVE
Nitrite, UA: NEGATIVE
Protein, UA: POSITIVE — AB
Spec Grav, UA: >=1.03 — AB (ref 1.010–1.025)
Urobilinogen, UA: 1 E.U./dL
pH, UA: 6 (ref 5.0–8.0)

## 2022-01-02 MED ORDER — AMOXICILLIN-POT CLAVULANATE 875-125 MG PO TABS: 1.0000 | ORAL_TABLET | Freq: Two times a day (BID) | ORAL | 0 refills | Status: DC

## 2022-01-02 MED ORDER — IOPAMIDOL (ISOVUE-300) INJECTION 61%
100.0000 mL | Freq: Once | INTRAVENOUS | Status: AC | PRN
  Administered 2022-01-02: 100 mL via INTRAVENOUS

## 2022-01-02 NOTE — Assessment & Plan Note (Signed)
CT abdomen pelvis results at the end of the day shows acute sigmoid diverticulitis without perforation or abscess.  Discussed in depth with patient over the telephone.  Augmentin prescribed.  Follow-up if worsening or not back to baseline after she completes the antibiotic.  Discussed that she should also reach out to her GI to let them know.  She is up-to-date on colonoscopy

## 2022-01-02 NOTE — Progress Notes (Signed)
Subjective:     Patient ID: Alyssa Bartlett, female    DOB: January 14, 1971, 51 y.o.   MRN: [id number]  Chief Complaint  Patient presents with   Abdominal cramping   Headache   Nausea   Fever    Low grade    decreased appetite    Headache  Associated symptoms include a fever.  Fever  Associated symptoms include headaches.   Patient is in today for a 3 day history of low grade fever, lower abdominal cramping,  nausea with decreased appetite. Abnormal bowel movements for the past 2 weeks. Feels bloated and constipated.   Bloated lower abdomen and pain with movement.  Last Colonoscopy 01/2021  Home Covid test yesterday yesterday.   States she completed a course of metronidazole last week for BV.   No body aches, dizziness, URI symptoms, chest pain, palpitations, shortness of breath, cough, vomiting, diarrhea, urinary symptoms   Health Maintenance Due  Topic Date Due   Zoster Vaccines- Shingrix (1 of 2) Never done   PAP SMEAR-Modifier  06/20/2015   COVID-19 Vaccine (4 - Booster for Pfizer series) 06/27/2020   MAMMOGRAM  Never done    Past Medical History:  Diagnosis Date   Allergic reaction to food    ? shellfish   Anxiety    GERD (gastroesophageal reflux disease)    Hemorrhoid 09/17/2015   History of chicken pox    Hypothyroidism    [facility] by [facility]   Mild hyperlipidemia    no meds   Mood disorder (HCC)    GAD, panic, depression   Prediabetes    Tobacco use     Past Surgical History:  Procedure Laterality Date   COLONOSCOPY     CRANIOTOMY N/A 03/26/2015   Procedure: CRANIOTOMY HYPOPHYSECTOMY TRANSNASAL APPROACH;  Surgeon: Consuella Lose, MD;  Location: [facility];  Service: Neurosurgery;  Laterality: N/A;   POLYPECTOMY     SINUS ENDO W/FUSION N/A 03/26/2015   Procedure: ENDOSCOPIC SINUS SURGERY WITH NAVIGATION;  Surgeon: Ruby Cola, MD;  Location: [facility];  Service: ENT;  Laterality: N/A;    Family History  Problem  Relation Age of Onset   Allergic rhinitis Mother    Hyperlipidemia Mother    Diabetes Mother    Arthritis Mother    High blood pressure Mother    Allergic rhinitis Father    Prostate cancer Father    Hyperlipidemia Father    Heart disease Father 59       CABG   Heart attack Father 74   Diverticulitis Father        had surgery   Diabetes Father    Colon polyps Father    Arthritis Father    High blood pressure Father    Heart disease Maternal Grandfather    Alzheimer's disease Maternal Grandfather    Colon cancer Neg Hx    Rectal cancer Neg Hx    Esophageal cancer Neg Hx    Stomach cancer Neg Hx     Social History   Socioeconomic History   Marital status: Single    Spouse name: Not on file   Number of children: 0   Years of education: Not on file   Highest education level: Not on file  Occupational History   Occupation: intake speacialists  Tobacco Use   Smoking status: Former    Packs/day: 1.00    Years: 14.00    Total pack years: 14.00    Types: Cigarettes    Quit date:  2011    Years since quitting: 12.5   Smokeless tobacco: Never   Tobacco comments:     Nicotine gum quit 04/2017  Vaping Use   Vaping Use: Never used  Substance and Sexual Activity   Alcohol use: Yes    Alcohol/week: 5.0 - 7.0 standard drinks of alcohol    Types: 5 - 7 Glasses of wine per week   Drug use: No   Sexual activity: Yes  Other Topics Concern   Not on file  Social History Narrative   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     escitalopram (LEXAPRO) 20 MG tablet Take 20 mg by mouth at bedtime.     LORazepam (ATIVAN) 0.5 MG tablet Take 0.5 mg by mouth 2 (two) times daily. Take 1 tablet in the morning and half tablet in the evening     omeprazole (PRILOSEC) 40 MG capsule Take 1 capsule (40 mg total) by mouth daily. 90 capsule 3   SYNTHROID 112 MCG tablet TAKE 1 TABLET BY MOUTH DAILY BEFORE BREAKFAST 90 tablet 3   metroNIDAZOLE (FLAGYL) 500 MG tablet      Omeprazole Magnesium (PRILOSEC) 10 MG PACK Prilosec 10 mg oral suspension,delayed release  Take 2 packets every day by oral route.     sodium chloride (OCEAN) 0.65 % SOLN nasal spray Place 1 spray into both nostrils as needed for congestion.     Facility-Administered Medications Prior to Visit  Medication Dose Route Frequency Provider Last Rate Last Admin   0.9 %  sodium chloride infusion  500 mL Intravenous Once Armbruster, Carlota Raspberry, MD        No Known Allergies  Review of Systems  Constitutional:  Positive for fever.  Neurological:  Positive for headaches.       Objective:    Physical Exam Constitutional:      General: She is not in acute distress.    Appearance: She is ill-appearing.  HENT:     Mouth/Throat:     Mouth: Mucous membranes are moist.  Cardiovascular:     Rate and Rhythm: Normal rate and regular rhythm.  Pulmonary:     Effort: Pulmonary effort is normal.     Breath sounds: Normal breath sounds.  Abdominal:     General: Bowel sounds are increased.     Palpations: Abdomen is soft.     Tenderness: There is abdominal tenderness in the left lower quadrant. There is guarding and rebound.  Musculoskeletal:     Cervical back: Normal range of motion and neck supple.  Lymphadenopathy:     Cervical: No cervical adenopathy.  Skin:    General: Skin is warm and dry.  Neurological:     Mental Status: She is  alert and oriented to person, place, and time.     Cranial Nerves: No cranial  nerve deficit.     Motor: Motor function is intact.     BP 122/84 (BP Location: Right Arm, Patient Position: Sitting, Cuff Size: Large)   Pulse 91   Temp 98 F (36.7 C) (Oral)   Wt 190 lb (86.2 kg)   SpO2 96%   BMI 32.61 kg/m  Wt Readings from Last 3 Encounters:  01/02/22 190 lb (86.2 kg)  11/18/21 195 lb (88.5 kg)  06/28/21 194 lb 6.4 oz (88.2 kg)       Assessment & Plan:   Problem List Items Addressed This Visit       Digestive   Sigmoid diverticulitis - Primary    CT abdomen pelvis results at the end of the day shows acute sigmoid diverticulitis without perforation or abscess.  Discussed in depth with patient over the telephone.  Augmentin prescribed.  Follow-up if worsening or not back to baseline after she completes the antibiotic.  Discussed that she should also reach out to her GI to let them know.  She is up-to-date on colonoscopy      Relevant Medications   amoxicillin-clavulanate (AUGMENTIN) 875-125 MG tablet     Other   Abnormal bowel movement   Relevant Orders   CBC with Differential/Platelet (Completed)   Comprehensive metabolic panel (Completed)   Left lower quadrant abdominal pain   Relevant Orders   CBC with Differential/Platelet (Completed)   Comprehensive metabolic panel (Completed)   CT Abdomen Pelvis W Contrast (Completed)   POCT urinalysis dipstick (Completed)   Urine Culture   Left lower quadrant abdominal tenderness with rebound tenderness    No acute distress.  CT abdomen pelvis with contrast ordered.  Suspect diverticulitis.      Relevant Orders   CBC with Differential/Platelet (Completed)   Comprehensive metabolic panel (Completed)   CT Abdomen Pelvis W Contrast (Completed)   Low grade fever   Relevant Orders   CBC with Differential/Platelet (Completed)   Comprehensive metabolic panel (Completed)   CT Abdomen Pelvis W Contrast (Completed)    I have  discontinued Judeen Hammans L. Files's sodium chloride, PriLOSEC, and metroNIDAZOLE. I am also having her start on amoxicillin-clavulanate. Additionally, I am having her maintain her escitalopram, LORazepam, dexamethasone, omeprazole, and Synthroid. We will continue to administer sodium chloride.  Meds ordered this encounter  Medications   amoxicillin-clavulanate (AUGMENTIN) 875-125 MG tablet    Sig: Take 1 tablet by mouth 2 (two) times daily.    Dispense:  20 tablet    Refill:  0    Order Specific Question:   Supervising Provider    Answer:   Pricilla Holm A [[id number]]

## 2022-01-02 NOTE — Patient Instructions (Signed)
Please go to the lab before leaving today.   I have ordered a stat CT of your abdomen and pelvis to be done today. Please go to [facility] at [address]   Do not eat eat or drink anything until you have the test done.

## 2022-01-02 NOTE — Assessment & Plan Note (Signed)
No acute distress.  CT abdomen pelvis with contrast ordered.  Suspect diverticulitis.

## 2022-01-03 LAB — URINE CULTURE

## 2022-02-21 ENCOUNTER — Other Ambulatory Visit: Payer: Self-pay

## 2022-02-21 MED ORDER — OMEPRAZOLE 40 MG PO CPDR: 40.0000 mg | DELAYED_RELEASE_CAPSULE | Freq: Every day | ORAL | 0 refills | Status: DC

## 2022-02-21 NOTE — Telephone Encounter (Signed)
Omeprazole refilled.

## 2022-02-23 ENCOUNTER — Other Ambulatory Visit: Payer: Self-pay

## 2022-02-23 MED ORDER — OMEPRAZOLE 40 MG PO CPDR: 40.0000 mg | DELAYED_RELEASE_CAPSULE | Freq: Every day | ORAL | 0 refills | Status: DC

## 2022-02-23 NOTE — Telephone Encounter (Signed)
Omeprazole refill resent,when sent in on Sept 5th was marked no print not normal. Patient informed.

## 2022-05-23 ENCOUNTER — Other Ambulatory Visit: Payer: BC Managed Care – PPO

## 2022-05-23 ENCOUNTER — Ambulatory Visit (INDEPENDENT_AMBULATORY_CARE_PROVIDER_SITE_OTHER): Payer: BC Managed Care – PPO | Admitting: Gastroenterology

## 2022-05-23 ENCOUNTER — Encounter: Payer: Self-pay | Admitting: Gastroenterology

## 2022-05-23 VITALS — BP 130/80 | HR 82 | Ht 64.0 in | Wt 192.2 lb

## 2022-05-23 DIAGNOSIS — Z79899 Other long term (current) drug therapy: Secondary | ICD-10-CM

## 2022-05-23 DIAGNOSIS — R140 Abdominal distension (gaseous): Secondary | ICD-10-CM | POA: Diagnosis not present

## 2022-05-23 DIAGNOSIS — K5732 Diverticulitis of large intestine without perforation or abscess without bleeding: Secondary | ICD-10-CM | POA: Diagnosis not present

## 2022-05-23 DIAGNOSIS — R194 Change in bowel habit: Secondary | ICD-10-CM

## 2022-05-23 DIAGNOSIS — K219 Gastro-esophageal reflux disease without esophagitis: Secondary | ICD-10-CM

## 2022-05-23 DIAGNOSIS — Z86010 Personal history of colonic polyps: Secondary | ICD-10-CM

## 2022-05-23 MED ORDER — IBGARD 90 MG PO CPCR: ORAL_CAPSULE | ORAL | 0 refills | Status: AC

## 2022-05-23 MED ORDER — CITRUCEL PO POWD: 1.0000 | Freq: Every day | ORAL | Status: DC

## 2022-05-23 NOTE — Progress Notes (Signed)
HPI :  51 year old female with a history of colon polyps, GERD, diverticulitis, here for a follow-up visit.  She was last seen in August 2022.  She states back in July of this past summer she had developed lower abdominal pain and fever.  CT scan was obtained at that time showing acute sigmoid diverticulitis.  This is the first time she is ever had diverticulitis.  She was given antibiotics however states she never took some given the symptoms get better before she could pick them up.  She has not had recurrence of that pain but she has had some intermittent discomfort in her lower abdomen at times.  She can feel in both the right lower side and left lower side.  Sometimes it can be relieved with a bowel movement, sometimes not.  Along with this she had a change in her bowel movements over the past few months.  Stool form can be altered, she often goes a few times per day, has a sense of incomplete evacuation.  Her stools can be thin and then at times she states she may see oil or fat in the stool.  She denies any blood.  She feels a lot of bloating and gas that can bother her with this as well.  She has taken Metamucil in the past for this that does help her stool form and provide some regularity although she has a hard time taking it she states in relation to some of her other medications.  Recall she has had difficult to remove advanced adenomas in the past.  Her last exam with me was in August 2022 which showed no recurrent or residual polyp at the large polypectomy site, it was recommended she have a repeat colonoscopy in 3 years.  On colonoscopies in the past she has had diverticulosis noted in the left colon.  She has had total of 3 colonoscopies since 2020.  She otherwise endorses problems with reflux that have been bothering her.  She has regurgitation as her main symptom along with water brash that can bother her.  This more so bothers her at night when she is lying down, can wake her up.  She  does not have as much pyrosis with this.  She denies any dysphagia.  She is eating okay, no nausea or vomiting.  She typically separates dinner and bedtime by around 3 hours or so, although often lies down after dinner on the couch.  She has been on omeprazole 40 mg daily for some time, and also uses Gaviscon as needed for breakthrough.  If she takes Gaviscon does help her.  She had an EGD with me back in August 2022 with a 2 cm hiatal hernia and very mild esophagitis, no Barrett's.      Endoscopic history:    Colonoscopy 08/21/18 - A few small-mouthed diverticula were found in the ascending colon. A 3 mm polyp was found in the cecum. The polyp was flat. The polyp was removed with a cold snare. Resection and retrieval were complete. A 15 mm polyp was found in the transverse colon. The polyp was semi-sessile and wrapped around an angulated fold, was difficult to gain good position for polypectomy. The polyp was removed with a piecemeal technique using a cold snare. Resection and retrieval were complete. Area just distal to the lesion was tattooed with an injection of Spot (carbon black). Two pedunculated polyps were found in the sigmoid colon. The polyps were 10 to 12 mm in size with inflammtory changes,  suspect these could be the cause of bleeding. These polyps were removed with a hot snare. Resection and retrieval were complete. Four sessile polyps were found in the rectum and recto-sigmoid colon. The polyps were 2 to 3 mm in size. These polyps were removed with a cold snare. Resection and retrieval were Complete. Internal hemorrhoids were found during retroflexion.   Most polyps adenomas / sessile serrated - with hyperplastic polyps in the rectum / rectosigmoid colon   Colonoscopy 06/03/19 - The perianal and digital rectal examinations were normal. - A few small-mouthed diverticula were found in the sigmoid colon and ascending colon, associated superficial erythema noted around diverticulum in sigmoid  colon. - A polypectomy scar and tattoo was noted in the transverse colon with an area of roughly 5 to 6 mm residual polyp. The polyp was sessile. It was in an area was was extremely angulated, difficult to even visualize it. Multiple passes with the snare, using suction / positional changes were made in attempt to get the snare even near the lesion were made, this was technically extremely challenging. Eventually, most of the polyp was initially removed with one pass with the cold snare however some residual polyp on the proximal periphery persisted. Multiple passes were made with cold forceps to remove the residual base of it. Coagulation with the snare tip was then applied to the base of the lesion to treat any potential further residual polypoid tissue. - Internal hemorrhoids were found during retroflexion. - The exam was otherwise without abnormality.   Surgical [P], colon, transverse, polyp - TUBULAR ADENOMA (MULTIPLE FRAGMENTS). - NO HIGH GRADE DYSPLASIA OR MALIGNANCY    EGD 02/15/2021: - A 2 cm hiatal hernia was present. - Very mild / diminutive focal area esophagitis was found 37 cm from the incisors. - The exam of the esophagus was otherwise normal. - The entire examined stomach was normal. - The duodenal bulb and second portion of the duodenum were normal.   Colonoscopy 02/15/2021: - The perianal and digital rectal examinations were normal. - A diminutive polyp was found in the cecum. The polyp was sessile. The polyp was removed with a cold snare. Resection and retrieval were complete. - A medium post polypectomy scar was found in the transverse colon just distal to a tattoo. The scar tissue was healthy in appearance. There was no evidence of the previous polyp. It was not easily seen and multiple passes made in the area (angulated turn / tortous) - A diminutive polyp was found in the recto-sigmoid colon. The polyp was sessile. The polyp was removed with a cold snare.  Resection and retrieval were complete. - The colon was tortuous. - Internal hemorrhoids were found during retroflexion. - The exam was otherwise without abnormality.  Surgical [P], colon, recto - sigmoid and cecum, polyp 2 - SESSILE SERRATED POLYP (1 OF 2 FRAGMENTS) - BENIGN COLONIC MUCOSA (1 OF 2 FRAGMENTS) - NO HIGH-GRADE DYSPLASIA OR MALIGNANCY IDENTIFIED  Repeat colonoscopy in 3 years   CT abdomen / pelvis with contrast 01/02/22: IMPRESSION: Acute sigmoid diverticulitis. No signs of perforation or abscess formation.   Past Medical History:  Diagnosis Date   Acute diverticulitis 12/2021   Allergic reaction to food    ? shellfish   Anxiety    GERD (gastroesophageal reflux disease)    Hemorrhoid 09/17/2015   History of chicken pox    Hypothyroidism    managed by [facility]   Mild hyperlipidemia    no meds   Mood disorder ([facility])  GAD, panic, depression   Prediabetes    Tobacco use      Past Surgical History:  Procedure Laterality Date   COLONOSCOPY     CRANIOTOMY N/A 03/26/2015   Procedure: CRANIOTOMY HYPOPHYSECTOMY TRANSNASAL APPROACH;  Surgeon: Consuella Lose, MD;  Location: [facility];  Service: Neurosurgery;  Laterality: N/A;   POLYPECTOMY     SINUS ENDO W/FUSION N/A 03/26/2015   Procedure: ENDOSCOPIC SINUS SURGERY WITH NAVIGATION;  Surgeon: Ruby Cola, MD;  Location: [facility];  Service: ENT;  Laterality: N/A;   Family History  Problem Relation Age of Onset   Allergic rhinitis Mother    Hyperlipidemia Mother    Diabetes Mother    Arthritis Mother    High blood pressure Mother    Allergic rhinitis Father    Prostate cancer Father    Hyperlipidemia Father    Heart disease Father 43       CABG   Heart attack Father 18   Diverticulitis Father        had surgery   Diabetes Father    Colon polyps Father    Arthritis Father    High blood pressure Father    Heart disease Maternal Grandfather    Alzheimer's disease Maternal  Grandfather    Colon cancer Neg Hx    Rectal cancer Neg Hx    Esophageal cancer Neg Hx    Stomach cancer Neg Hx    Social History   Tobacco Use   Smoking status: Former    Packs/day: 1.00    Years: 14.00    Total pack years: 14.00    Types: Cigarettes    Quit date: 2011    Years since quitting: 12.9   Smokeless tobacco: Never   Tobacco comments:     Nicotine gum quit 04/2017  Vaping Use   Vaping Use: Never used  Substance Use Topics   Alcohol use: Yes    Alcohol/week: 5.0 - 7.0 standard drinks of alcohol    Types: 5 - 7 Glasses of wine per week   Drug use: No   Current Outpatient Medications  Medication Sig Dispense Refill   escitalopram (LEXAPRO) 20 MG tablet Take 20 mg by mouth at bedtime.     LORazepam (ATIVAN) 0.5 MG tablet Take 0.5 mg by mouth 2 (two) times daily. Take 1 tablet in the morning and half tablet in the evening     omeprazole (PRILOSEC) 40 MG capsule Take 1 capsule (40 mg total) by mouth daily. 90 capsule 0   SYNTHROID 112 MCG tablet TAKE 1 TABLET BY MOUTH DAILY BEFORE BREAKFAST 90 tablet 3   Current Facility-Administered Medications  Medication Dose Route Frequency Provider Last Rate Last Admin   0.9 %  sodium chloride infusion  500 mL Intravenous Once Ralph Benavidez, Carlota Raspberry, MD       No Known Allergies   Review of Systems: All systems reviewed and negative except where noted in HPI.   Lab Results  Component Value Date   WBC 9.1 01/02/2022   HGB 13.6 01/02/2022   HCT 40.6 01/02/2022   MCV 90.4 01/02/2022   PLT 298.0 01/02/2022    Lab Results  Component Value Date   CREATININE 0.67 01/02/2022   BUN 11 01/02/2022   NA 139 01/02/2022   K 4.2 01/02/2022   CL 100 01/02/2022   CO2 28 01/02/2022    Lab Results  Component Value Date   ALT 34 01/02/2022   AST 33 01/02/2022   ALKPHOS 77 01/02/2022  BILITOT 0.6 01/02/2022     Physical Exam: BP 130/80 (BP Location: Left Arm, Patient Position: Sitting, Cuff Size: Normal)   Pulse 82   Ht  '5\' 4"'$  (1.626 m)   Wt 192 lb 4 oz (87.2 kg)   SpO2 97%   BMI 33.00 kg/m  Constitutional: Pleasant,well-developed, female in no acute distress. Abdominal: Soft, nondistended, nontender. There are no masses palpable.  Neurological: Alert and oriented to person place and time. Psychiatric: Normal mood and affect. Behavior is normal.   ASSESSMENT: 51 y.o. female here for assessment of the following  1. Altered bowel habits   2. Bloating   3. Sigmoid diverticulitis   4. Gastroesophageal reflux disease, unspecified whether esophagitis present   5. Long-term current use of proton pump inhibitor therapy   6. History of colon polyps    Issues discussed as outlined above.  Some altered bowel habits recently with intermittent lower abdominal discomfort.  She did have an episode of first-time diverticulitis over this past summer which resolved without intervention and her current symptoms are different from the symptoms of diverticulitis.  She has had multiple colonoscopies in the past few years due to difficult to remove polyps/surveillance.  Discussed options to manage her symptoms.  Given the bloating she has been having I will test her for celiac disease with labs, and her description of fatty/oily stools at times, will check pancreatic fecal elastase.  Given her significant bloating and recommend avoiding using Metamucil.  However fiber has provided some regularity to her, would switch Metamucil to Citrucel once daily which I think will be better tolerated.  Also recommend trial of low FODMAP diet to see if that will help her bloating and handouts provided on that.  Also gave some samples of IBgard to use as needed for cramps.  Her labs are reassuring.  Discussed her reflux history for a bit with her.  We discussed long-term risks of chronic PPI use, she understands these risks however does not feel she can reduce dose or come off of it.  In fact given her persistent symptoms we discussed increasing  her dose to twice daily and taking prior to mealtime to see if she gets any additional benefit from that.  She can try this for a few weeks and see if it helps.  I also offered her a trial of baclofen to use 10 mg nightly to help with nocturnal symptoms, particularly regurgitation.  I think she is a good candidate for this although she wants to think about it for now and wants to hold off and see how she does with higher dose omeprazole.  Long-term however, if her symptoms remain poorly controlled or if she wants to come off PPI, she may want to consider TIF, I think she is a good candidate for this and I discussed what that would entail.  She will think about this and get back to me if she wants to pursue this at some point in time.  PLAN: - stop metamucil - lab for fecal pancreatic elastase - lab for total IgA and TTG IgA - counseled on low FODMAP diet and handouts provided - start Citrucel once daily once she has submitted stool tests - trial of IB gard PRN - samples provided - counseled on long term risks of PPIs - long term use lowest dose needed to control symptoms - increase omeprazole to twice daily for 1 month trial - take 1/2 hr prior to meal - if no benefit with higher dose of  PPI, consider trial of Baclofen at night for regurgitation - avoid eating 4 hours prior to bedtime, sleep with head of bed elevated - discussed other options including TIF, she wants to hold off on interventional therapies right now - follow up PRN  I spent 35 minutes of time, including in depth chart review, face-to-face time with the patient, coordinating care, and documenting this encounter.  Jolly Mango, MD [facility]

## 2022-05-23 NOTE — Patient Instructions (Signed)
If you are age 51 or older, your body mass index should be between 23-30. Your Body mass index is 33 kg/m. If this is out of the aforementioned range listed, please consider follow up with your Primary Care Provider.  If you are age 12 or younger, your body mass index should be between 19-25. Your Body mass index is 33 kg/m. If this is out of the aformentioned range listed, please consider follow up with your Primary Care Provider.   ________________________________________________________   Stop metamucil.  Start Citrucel once daily (after you submit the stool sample)  Please go to the lab in the basement of our building to have lab work done as you leave today. Hit "B" for basement when you get on the elevator.  When the doors open the lab is on your left.  We will call you with the results. Thank you.  We are giving you a Low-FODMAP diet handout today. FODMAPs are short-chain carbohydrates (sugars) that are highly fermentable, which means that they go through chemical changes in the GI system, and are poorly absorbed during digestion. When FODMAPs reach the colon (large intestine), bacteria ferment these sugars, turning them into gas and chemicals. This stretches the walls of the colon, causing abdominal bloating, distension, cramping, pain, and/or changes in bowel habits in many patients with IBS. FODMAPs are not unhealthy or harmful, but may exacerbate GI symptoms in those with sensitive GI tracts.  We have given you samples of the following medication to take:  IBgard - Take as directed  Increase omeprazole to twice a day for 1 month.  Thank you for entrusting me with your care and for choosing [facility], Dr. [place] Cellar

## 2022-05-24 LAB — IGA: Immunoglobulin A: 197 mg/dL (ref 47–310)

## 2022-05-24 LAB — TISSUE TRANSGLUTAMINASE, IGA: (tTG) Ab, IgA: <1 U/mL

## 2022-05-31 ENCOUNTER — Other Ambulatory Visit: Payer: BC Managed Care – PPO

## 2022-05-31 DIAGNOSIS — R140 Abdominal distension (gaseous): Secondary | ICD-10-CM | POA: Diagnosis not present

## 2022-05-31 DIAGNOSIS — K219 Gastro-esophageal reflux disease without esophagitis: Secondary | ICD-10-CM | POA: Diagnosis not present

## 2022-05-31 DIAGNOSIS — R194 Change in bowel habit: Secondary | ICD-10-CM | POA: Diagnosis not present

## 2022-05-31 DIAGNOSIS — K5732 Diverticulitis of large intestine without perforation or abscess without bleeding: Secondary | ICD-10-CM

## 2022-05-31 DIAGNOSIS — Z79899 Other long term (current) drug therapy: Secondary | ICD-10-CM

## 2022-05-31 DIAGNOSIS — Z86010 Personal history of colonic polyps: Secondary | ICD-10-CM

## 2022-06-07 LAB — PANCREATIC ELASTASE, FECAL: Pancreatic Elastase-1, Stool: >500 mcg/g

## 2022-06-27 ENCOUNTER — Ambulatory Visit: Payer: BC Managed Care – PPO | Admitting: Internal Medicine

## 2022-06-27 NOTE — Progress Notes (Deleted)
Patient ID: Alyssa Bartlett, female   DOB: 11/15/1970, 52 y.o.   MRN: [id number]  HPI  Alyssa Bartlett is a 52 y.o.-year-old female, returning for f/u for hypothyroidism and pituitary apoplexy. Last visit 1 year ago.  Interim history: She denies increased fatigue, headaches, visual disturbance.  Hypothyroidism:  Reviewed history: Pt. has been dx with hypothyroidism in 2011; started Synthroid, then Levothyroxine >> did not feel good (aches, pains, fatigue) she was seeing [facility] providers and also saw endocrinology before; was on NatureThroid 48.7 mg bid (~160 mcg LT4 daily) >> was feeling better, weight normalized >> however, had increased anxiety - after an allergic rxn (or Niacin flush - was taking a B complex at that time), worse fatigue, insomnia. She is on Synthroid. Lexapro helped the above sxs.  Pt is on Synthroid 112 mcg daily, taken: - in am - fasting - at least 30 min from b'fast - no calcium - no iron - no multivitamins - + PPIs (Prilosec)-later the day (takes this daily) - not on Biotin  Reviewed patient's TFTs.  We usually follow her by free T4 levels since TSH levels are unreliable for her Lab Results  Component Value Date   TSH 0.05 (L) 05/07/2020   TSH 0.03 (L) 06/17/2019   TSH 0.40 05/03/2018   TSH 0.09 (L) 09/27/2017   TSH 0.01 Repeated and verified X2. 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  - MRI (03/26/2015): The pituitary is enlarged with a fluid level evident on T2 weighted images. Blood products are present in the pituitary compatible with pituitary apoplexy. The gland measures 26 mm cephalo caudad dimension. It measures 19 x 19 mm in cross-sectional dimension. This displaces the optic chiasm superiorly. There is peripheral enhancement about the gland.   - s/p TSR b/c of visual loss (Dr. Kathyrn Sheriff)  - MRI (03/28/2015): Sequelae of recent transsphenoidal pituitary resection are identified. Surgical packing material is present in the posterior nasal cavity. The surgical cavity in the sella/ suprasellar cistern measures approximately 19 x 16 mm and demonstrates uniform peripheral enhancement of 3-4 mm in thickness, with anterior aspect open to the nasal cavity. The pituitary infundibulum is displaced superiorly and slightly rightward by these postoperative changes. No discrete nodular enhancement/residual mass is identified. There is no mass effect on the optic chiasm. There may be mild displacement of the prechiasmatic optic nerves, left more so than right.   Pituitary labs were normal with the exception of the pituitary-thyroid axis:  Component     Latest Ref Rng 03/28/2015 04/09/2015  Cortisol - AM     6.7 - 22.6 ug/dL 16.7   TSH     0.35 - 4.50 uIU/mL  0.15 (L)  Free T4     0.60 - 1.60 ng/dL  1.77 (  H)  T3, Free     2.3 - 4.2 pg/mL  3.4  Cortisol, Plasma       11.5  C206 ACTH     6 - 50 pg/mL  21  Prolactin       5.3  She denies increased thirst or urination  Visual field test at the end of 2016 >> significant improvement in vision after surgery. 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  09/16/2015: MRI pituitary: showed perhaps minimal peripheral residual tumor but otherwise a gross resection, mild sphenoid and right maxillary T2-hyperintense mucous retention  cysts, and otherwise nicely patent and well-healed sinusotomies.   Component     Latest Ref Rng 10/28/2015  TSH     0.35 - 4.50 uIU/mL 0.07 (L)  T4,Free(Direct)     0.60 - 1.60 ng/dL 1.16  Prolactin      7.1  Cortisol, Plasma      15.0  LH      2.55  FSH      4.9    MRI (08/2016): Stable residual tumor versus scar tissue, but believed to be more consistent with scar tissue.  Component     Latest Ref Rng & Units 09/27/2017  IGF-I, LC/MS     52 - 328 ng/mL 196  Z-Score (Female)     -2.0 - 2 SD 0.7  TSH     0.35 - 4.50 uIU/mL 0.09 (L)  Prolactin     ng/mL 8.3  LH     mIU/mL 0.96  FSH     mIU/ML 3.4  C206 ACTH     6 - 50 pg/mL 28  Cortisol, Plasma     ug/dL 8.6  Triiodothyronine,Free,Serum     2.3 - 4.2 pg/mL 3.4  T4,Free(Direct)     0.60 - 1.60 ng/dL 0.99    MRI (09/2017): stable  Component     Latest Ref Rng & Units 05/03/2018  IGF-I, LC/MS     52 - 328 ng/mL 147  Z-Score (Female)     -2.0 - 2 SD 0.1  TSH     0.35 - 4.50 uIU/mL 0.40  Prolactin     ng/mL 9.4  LH     mIU/mL 0.76  FSH     mIU/ML 3.0  T4,Free(Direct)     0.60 - 1.60 ng/dL 1.05  Triiodothyronine,Free,Serum     2.3 - 4.2 pg/mL 3.8  Cortisol, Plasma     ug/dL 6.4  C206 ACTH     6 - 50 pg/mL 21   Component     Latest Ref Rng & Units 06/17/2019  IGF-I, LC/MS     52 - 328 ng/mL 135  Z-Score (Female)     -2.0 - 2 SD -0.1  C206 ACTH     6 - 50 pg/mL 21  Cortisol, Plasma     ug/dL 12.2  LH     mIU/mL 10.53  FSH     mIU/ML 5.5  T4,Free(Direct)     0.60 - 1.60 ng/dL 1.03  TSH     0.35 - 4.50 uIU/mL 0.03 (L)   MRI (09/04/2019):   1. Continued stable postoperative appearance of the pituitary and regional soft tissues.  2. Otherwise normal MRI appearance of the brain.  Component     Latest Ref Rng & Units 06/28/2021  IGF-I, LC/MS     50 - 317 ng/mL 126  Z-Score (Female)     -2.0 - 2.0 SD -0.2  T4,Free(Direct)     0.60 - 1.60  ng/dL 1.06  Prolactin     ng/mL 9.0  C206 ACTH      6 - 50 pg/mL 31  Cortisol, Plasma     ug/dL 9.6  Growth Hormone     <OR = 7.1 ng/mL 0.1   MRI (11/10/2021): Decreased in size of the floor of the sella nodule, measuring 2 mm now  Menstrual cycles were regular, but lighter, at last visit.  No headaches or blurry vision.  She also has a history of prediabetes: Lab Results  Component Value Date   HGBA1C 6.1 06/28/2021   HGBA1C 6.0 05/07/2020   HGBA1C 5.8 06/17/2019   HGBA1C 5.5 03/12/2015   She has a history of hyperlipidemia: Lab Results  Component Value Date   CHOL 230 (H) 11/18/2021   HDL 61.30 11/18/2021   LDLCALC 139 (H) 11/18/2021   TRIG 146.0 11/18/2021   CHOLHDL 4 11/18/2021    ROS: + see HPI  I reviewed pt's medications, allergies, PMH, social hx, family hx, and changes were documented in the history of present illness. Otherwise, unchanged from my initial visit note.  Past Medical History  Diagnosis Date   Hypothyroidism     Mood disorder ([place])     GAD, panic, depression   History of chicken pox    Allergic reaction to food     ? shellfish   Prediabetes    Mild hyperlipidemia    Tobacco use    Past Surgical History:  Procedure Laterality Date   COLONOSCOPY     CRANIOTOMY N/A 03/26/2015   Procedure: CRANIOTOMY HYPOPHYSECTOMY TRANSNASAL APPROACH;  Surgeon: Consuella Lose, MD;  Location: [facility];  Service: Neurosurgery;  Laterality: N/A;   POLYPECTOMY     SINUS ENDO W/FUSION N/A 03/26/2015   Procedure: ENDOSCOPIC SINUS SURGERY WITH NAVIGATION;  Surgeon: Ruby Cola, MD;  Location: [facility];  Service: ENT;  Laterality: N/A;   History   Social History   Marital Status: Single    Spouse Name: N/A   Number of Children: 0   Occupational History   intake speacialists    Social History Main Topics   Smoking status: Former Smoker -- 1.00 packs/day for 14 years    Types: Cigarettes    Start date: 07/20/2008   Smokeless tobacco: Not on file   Alcohol Use: Yes     Comment: wine 2-3 drinks  4-5x a week   Drug Use: No   Social History Narrative   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     LORazepam (ATIVAN) 0.5 MG tablet Take 0.5 mg by mouth 2 (two) times daily. Take 1 tablet in the morning and half tablet in the evening     methylcellulose (CITRUCEL) oral powder Take 1 packet by mouth daily.     omeprazole (PRILOSEC) 40 MG capsule Take 1 capsule (40 mg total) by mouth daily. 90 capsule 0   Peppermint Oil (IBGARD) 90 MG CPCR Take as directed 12 capsule 0   SYNTHROID 112 MCG tablet TAKE 1 TABLET BY MOUTH DAILY BEFORE BREAKFAST 90  tablet 3   Current Facility-Administered Medications on File Prior to Visit  Medication Dose Route Frequency Provider Last Rate Last Admin   0.9 %  sodium chloride infusion  500 mL Intravenous Once Armbruster, Carlota Raspberry, MD       No Known Allergies Family History  Problem Relation Age of Onset   Allergic rhinitis Mother    Hyperlipidemia Mother    Diabetes Mother    Arthritis Mother    High blood pressure Mother    Allergic rhinitis Father    Prostate cancer Father    Hyperlipidemia Father    Heart disease Father 107       CABG   Heart attack Father 81   Diverticulitis Father        had surgery   Diabetes Father    Colon polyps Father    Arthritis Father    High blood pressure Father    Heart disease Maternal Grandfather    Alzheimer's disease Maternal Grandfather    Colon cancer Neg Hx    Rectal cancer Neg Hx    Esophageal cancer Neg Hx    Stomach cancer Neg Hx   HTN in M and F  PE: There were no vitals taken for this visit. Wt Readings from Last 3 Encounters:   05/23/22 192 lb 4 oz (87.2 kg)  01/02/22 190 lb (86.2 kg)  11/18/21 195 lb (88.5 kg)   Constitutional: overweight, in NAD Eyes:  EOMI, no exophthalmos ENT: no neck masses, no cervical lymphadenopathy Cardiovascular: RRR, No MRG Respiratory: CTA B Musculoskeletal: no deformities Skin:no rashes Neurological: no tremor with outstretched hands  ASSESSMENT: 1.  Central hypothyroidism  01/01/2015: Thyroid U/S:  Right thyroid lobe: 3.3 cm x 0.8 cm x 0.9 cm. Small hypoechoic nodule at the inferior right thyroid measures approximately 3 mm. Heterogeneous appearance of the thyroid tissue Left thyroid lobe: 3.1 cm x 0.6 cm x 0.8 cm. No nodules. Heterogeneous appearance of the thyroid tissue. Isthmus Thickness: 3 mm.  No nodules visualized. Lymphadenopathy: None visualized. IMPRESSION: Relatively unremarkable appearance of the thyroid with single small benign-appearing nodule on the right. No left-sided nodules.  2. H/o Pituitary tumor with apoplexy  3. H/o prediabetes  PLAN:  1. Patient with longstanding hypothyroidism, previously on Nature-Throid, but now on Synthroid.  We are following her by free T4 levels since she most likely has central hypothyroidism and TSH levels that are unreliable - latest thyroid labs reviewed with pt. >> normal free T4 at last check in 06/2021 - she continues on Synthroid d.a.w. 112 mcg daily - pt feels good on this dose. - we discussed about taking the thyroid hormone every day, with water, >30 minutes before breakfast, separated by >4 hours from acid reflux medications, calcium, iron, multivitamins. Pt. is taking it correctly. - will check thyroid tests today: Free T4 that she did not eat or take her levothyroxine this morning - If labs are abnormal, she will need to return for repeat TFTs in 1.5 months  2. H/o Pituitary apoplexy -She is status post transsphenoidal resection of pituitary tumor after an episode of hemorrhage.  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  Her visual capacity normalized after surgery.  Visual field greatly improved in 2017.  She is followed by neurosurgery, Dr. Kathyrn Sheriff-  MRI in 09/2017 showed stable scar tissue versus recurrence in the sella, but it was felt that this was most likely scar tissue.  On the latest MRI from 08/2019, there was  no suspicious mass. A VF was intact.  A new pituitary MRI on 11/10/2021 (ordered by me And this showed decrease in size floor of the sella nodule, measuring 2 mm now, consistent with a benign etiology -We checked her pituitary and it showed a low TSH, consistent with central hypothyroidism -We reviewed her pituitary lab results from a year ago and they were normal with the exception of a low TSH -Since she is asymptomatic recheck her TFTs, but will do so at next visit -I will see her back in 1 year  3. H/o Prediabetes -at last OV, HbA1c was 6.1%, slightly higher -She gained 6 pounds before last, but weight approximately stable since -she gained 6 lbs since last OV -We discussed in the past about improving diet -We will recheck her HbA1c at today's visit  Needs to lay down during lab draw, also use smallest needle.    Philemon Kingdom, MD PhD [facility]

## 2022-07-10 ENCOUNTER — Other Ambulatory Visit: Payer: Self-pay | Admitting: Gastroenterology

## 2022-07-11 MED ORDER — OMEPRAZOLE 40 MG PO CPDR: 40.0000 mg | DELAYED_RELEASE_CAPSULE | Freq: Every day | ORAL | 0 refills | Status: DC

## 2022-07-12 ENCOUNTER — Other Ambulatory Visit: Payer: Self-pay

## 2022-07-12 NOTE — Progress Notes (Signed)
ERROR

## 2022-08-10 ENCOUNTER — Ambulatory Visit (INDEPENDENT_AMBULATORY_CARE_PROVIDER_SITE_OTHER): Payer: BC Managed Care – PPO | Admitting: Internal Medicine

## 2022-08-10 ENCOUNTER — Encounter: Payer: Self-pay | Admitting: Internal Medicine

## 2022-08-10 VITALS — BP 130/80 | HR 85 | Ht 64.0 in | Wt 192.6 lb

## 2022-08-10 DIAGNOSIS — E236 Other disorders of pituitary gland: Secondary | ICD-10-CM

## 2022-08-10 DIAGNOSIS — R7303 Prediabetes: Secondary | ICD-10-CM

## 2022-08-10 DIAGNOSIS — E039 Hypothyroidism, unspecified: Secondary | ICD-10-CM | POA: Diagnosis not present

## 2022-08-10 LAB — T4, FREE: Free T4: 0.83 ng/dL (ref 0.60–1.60)

## 2022-08-10 LAB — HEMOGLOBIN A1C: Hgb A1c MFr Bld: 6.3 % (ref 4.6–6.5)

## 2022-08-10 NOTE — Patient Instructions (Signed)
Please stop at the lab.  Please continue Synthroid 112 mcg daily.  Take the thyroid hormone every day, with water, at least 30 minutes before breakfast, separated by at least 4 hours from: - acid reflux medications - calcium - iron - multivitamins  Please come back for a follow-up appointment in 1 year.

## 2022-08-10 NOTE — Progress Notes (Signed)
Patient ID: Alyssa Bartlett, female   DOB: [birth year], 52 y.o.   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 Last visit 1 year ago.  Interim history: She denies increased fatigue, headaches, visual disturbance. She had another VF checked by "[facility]" last year.  Hypothyroidism:  Reviewed history: Pt. has been dx with hypothyroidism in 2011; started Synthroid, then Levothyroxine >> did not feel good (aches, pains, fatigue) she was seeing [facility] providers and also saw endocrinology before; was on NatureThroid 48.7 mg bid (~160 mcg LT4 daily) >> was feeling better, weight normalized >> however, had increased anxiety - after an allergic rxn (or Niacin flush - was taking a B complex at that time), worse fatigue, insomnia. She is on Synthroid. Lexapro helped the above sxs.  Pt is on Synthroid 112 mcg daily, taken: - in am - fasting - at least 30 min from b'fast - no calcium - no iron - no multivitamins - + PPIs (Prilosec)-in pm (takes this daily) - not on Biotin  Reviewed patient's TFTs.  We usually follow her by free T4 levels since TSH levels are unreliable for her Lab Results  Component Value Date   TSH 0.05 (L) 05/07/2020   TSH 0.03 (L) 06/17/2019   TSH 0.40 05/03/2018   TSH 0.09 (L) 09/27/2017   TSH 0.01 Repeated and verified X2. 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  - MRI (03/26/2015): The pituitary is enlarged with a fluid level evident on T2 weighted images. Blood products are present in the pituitary compatible with pituitary apoplexy. The gland measures 26 mm cephalo caudad dimension. It measures 19 x 19 mm in cross-sectional dimension. This displaces the optic chiasm superiorly. There is peripheral enhancement about the gland.   - s/p TSR b/c of visual loss (Dr. Kathyrn Sheriff)  - MRI (03/28/2015): Sequelae of recent transsphenoidal pituitary resection are identified. Surgical packing material is present in the posterior nasal cavity. The surgical cavity in the sella/ suprasellar cistern measures approximately 19 x 16 mm and demonstrates uniform peripheral enhancement of 3-4 mm in thickness, with anterior aspect open to the nasal cavity. The pituitary infundibulum is displaced superiorly and slightly rightward by these postoperative changes. No discrete nodular enhancement/residual mass is identified. There is no mass effect on the optic chiasm. There may be mild displacement of the prechiasmatic optic nerves, left more so than right.   Pituitary labs were normal with the exception of the pituitary-thyroid axis: Component     Latest Ref Rng 03/28/2015 04/09/2015  Cortisol - AM     6.7 - 22.6 ug/dL 16.7   TSH     0.35 - 4.50 uIU/mL  0.15 (L)  Free T4  0.60 - 1.60 ng/dL  1.77 (H)  T3, Free     2.3 - 4.2 pg/mL  3.4  Cortisol, Plasma       11.5  C206 ACTH     6 - 50 pg/mL  21  Prolactin       5.3  She denies increased thirst or urination  Visual field test at the end of 2016 >> significant improvement in vision after surgery. 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  09/16/2015: MRI pituitary: showed perhaps minimal peripheral residual tumor but otherwise a gross resection, mild sphenoid and right maxillary  T2-hyperintense mucous retention cysts, and otherwise nicely patent and well-healed sinusotomies.   Component     Latest Ref Rng 10/28/2015  TSH     0.35 - 4.50 uIU/mL 0.07 (L)  T4,Free(Direct)     0.60 - 1.60 ng/dL 1.16  Prolactin      7.1  Cortisol, Plasma      15.0  LH      2.55  FSH      4.9    MRI (08/2016): Stable residual tumor versus scar tissue, but believed to be more consistent with scar tissue.  Component     Latest Ref Rng & Units 09/27/2017  IGF-I, LC/MS     52 - 328 ng/mL 196  Z-Score (Female)     -2.0 - 2 SD 0.7  TSH     0.35 - 4.50 uIU/mL 0.09 (L)  Prolactin     ng/mL 8.3  LH     mIU/mL 0.96  FSH     mIU/ML 3.4  C206 ACTH     6 - 50 pg/mL 28  Cortisol, Plasma     ug/dL 8.6  Triiodothyronine,Free,Serum     2.3 - 4.2 pg/mL 3.4  T4,Free(Direct)     0.60 - 1.60 ng/dL 0.99    MRI (09/2017): stable  Component     Latest Ref Rng & Units 05/03/2018  IGF-I, LC/MS     52 - 328 ng/mL 147  Z-Score (Female)     -2.0 - 2 SD 0.1  TSH     0.35 - 4.50 uIU/mL 0.40  Prolactin     ng/mL 9.4  LH     mIU/mL 0.76  FSH     mIU/ML 3.0  T4,Free(Direct)     0.60 - 1.60 ng/dL 1.05  Triiodothyronine,Free,Serum     2.3 - 4.2 pg/mL 3.8  Cortisol, Plasma     ug/dL 6.4  C206 ACTH     6 - 50 pg/mL 21   Component     Latest Ref Rng & Units 06/17/2019  IGF-I, LC/MS     52 - 328 ng/mL 135  Z-Score (Female)     -2.0 - 2 SD -0.1  C206 ACTH     6 - 50 pg/mL 21  Cortisol, Plasma     ug/dL 12.2  LH     mIU/mL 10.53  FSH     mIU/ML 5.5  T4,Free(Direct)     0.60 - 1.60 ng/dL 1.03  TSH     0.35 - 4.50 uIU/mL 0.03 (L)   Component     Latest Ref Rng & Units 06/28/2021  IGF-I, LC/MS     50 - 317 ng/mL 126  Z-Score (Female)     -2.0 - 2.0 SD -0.2  T4,Free(Direct)     0.60 - 1.60 ng/dL 1.06  Prolactin     ng/mL 9.0  C206 ACTH     6 - 50  pg/mL 31  Cortisol, Plasma     ug/dL 9.6  Growth Hormone     <OR = 7.1 ng/mL 0.1   MRI (09/04/2019):   1. Continued  stable postoperative appearance of the pituitary and regional soft tissues.  2. Otherwise normal MRI appearance of the brain.  Component     Latest Ref Rng & Units 06/28/2021  IGF-I, LC/MS     50 - 317 ng/mL 126  Z-Score (Female)     -2.0 - 2.0 SD -0.2  T4,Free(Direct)     0.60 - 1.60 ng/dL 1.06  Prolactin     ng/mL 9.0  C206 ACTH     6 - 50 pg/mL 31  Cortisol, Plasma     ug/dL 9.6  Growth Hormone     <OR = 7.1 ng/mL 0.1   Pituitary MRI (11/10/2021):   No headaches or blurry vision.  She also has  prediabetes: Lab Results  Component Value Date   HGBA1C 6.1 06/28/2021   HGBA1C 6.0 05/07/2020   HGBA1C 5.8 06/17/2019   HGBA1C 5.5 03/12/2015   She has a history of hyperlipidemia: Lab Results  Component Value Date   CHOL 230 (H) 11/18/2021   HDL 61.30 11/18/2021   LDLCALC 139 (H) 11/18/2021   TRIG 146.0 11/18/2021   CHOLHDL 4 11/18/2021    ROS: + see HPI  I reviewed pt's medications, allergies, PMH, social hx, family hx, and changes were documented in the history of present illness. Otherwise, unchanged from my initial visit note.  Past Medical History  Diagnosis Date   Hypothyroidism     Mood disorder ([place])     GAD, panic, depression   History of chicken pox    Allergic reaction to food     ? shellfish   Prediabetes    Mild hyperlipidemia    Tobacco use    Past Surgical History:  Procedure Laterality Date   COLONOSCOPY     CRANIOTOMY N/A 03/26/2015   Procedure: CRANIOTOMY HYPOPHYSECTOMY TRANSNASAL APPROACH;  Surgeon: Consuella Lose, MD;  Location: [facility];  Service: Neurosurgery;  Laterality: N/A;   POLYPECTOMY     SINUS ENDO W/FUSION N/A 03/26/2015   Procedure: ENDOSCOPIC SINUS SURGERY WITH NAVIGATION;  Surgeon: Ruby Cola, MD;  Location: [facility];  Service: ENT;  Laterality: N/A;   History   Social History   Marital Status: Single    Spouse Name: N/A   Number of Children: 0   Occupational History   intake speacialists    Social  History Main Topics   Smoking status: Former Smoker -- 1.00 packs/day for 14 years    Types: Cigarettes    Start date: 07/20/2008   Smokeless tobacco: Not on file   Alcohol Use: Yes     Comment: wine 2-3 drinks 4-5x a week   Drug Use: No   Social History Narrative   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     LORazepam (ATIVAN) 0.5 MG tablet Take 0.5 mg by mouth 2 (two) times daily. Take 1 tablet in the  morning and half tablet in the evening     methylcellulose (CITRUCEL) oral powder Take 1 packet by mouth daily.     omeprazole (PRILOSEC) 40 MG capsule Take 1 capsule (40 mg total) by mouth daily. 90 capsule 0   Peppermint Oil (IBGARD) 90 MG CPCR Take as directed 12 capsule 0   SYNTHROID 112 MCG tablet TAKE 1 TABLET BY MOUTH DAILY BEFORE BREAKFAST 90 tablet 3   Current Facility-Administered Medications on File Prior to Visit  Medication Dose Route Frequency Provider Last Rate Last Admin   0.9 %  sodium chloride infusion  500 mL Intravenous Once Armbruster, Carlota Raspberry, MD       No Known Allergies Family History  Problem Relation Age of Onset   Allergic rhinitis Mother    Hyperlipidemia Mother    Diabetes Mother    Arthritis Mother    High blood pressure Mother    Allergic rhinitis Father    Prostate cancer Father    Hyperlipidemia Father    Heart disease Father 38       CABG   Heart attack Father 38   Diverticulitis Father        had surgery   Diabetes Father    Colon polyps Father    Arthritis Father    High blood pressure Father    Heart disease Maternal Grandfather    Alzheimer's disease Maternal  Grandfather    Colon cancer Neg Hx    Rectal cancer Neg Hx    Esophageal cancer Neg Hx    Stomach cancer Neg Hx   HTN in M and F  PE: BP 130/80 (BP Location: Left Arm, Patient Position: Sitting, Cuff Size: Normal)   Pulse 85   Ht 5' 4"$  (1.626 m)   Wt 192 lb 9.6 oz (87.4 kg)   SpO2 95%   BMI 33.06 kg/m  Wt Readings from Last 3 Encounters:  08/10/22 192 lb 9.6 oz (87.4 kg)  05/23/22 192 lb 4 oz (87.2 kg)  01/02/22 190 lb (86.2 kg)   Constitutional: overweight, in NAD Eyes:  EOMI, no exophthalmos ENT: no neck masses, no cervical lymphadenopathy Cardiovascular: RRR, No MRG Respiratory: CTA B Musculoskeletal: no deformities Skin:no rashes Neurological: no tremor with outstretched hands  ASSESSMENT: 1.  Central hypothyroidism  01/01/2015: Thyroid U/S:  Right thyroid lobe: 3.3 cm x 0.8 cm x 0.9 cm. Small hypoechoic nodule at the inferior right thyroid measures approximately 3 mm. Heterogeneous appearance of the thyroid tissue Left thyroid lobe: 3.1 cm x 0.6 cm x 0.8 cm. No nodules. Heterogeneous appearance of the thyroid tissue. Isthmus Thickness: 3 mm.  No nodules visualized. Lymphadenopathy: None visualized. IMPRESSION: Relatively unremarkable appearance of the thyroid with single small benign-appearing nodule on the right. No left-sided nodules.  2. H/o Pituitary tumor with apoplexy  3.Prediabetes  PLAN:  1. Patient with longstanding hypothyroidism, previously on Nature-Throid, but now on Synthroid.  We are following her by the free T4 level since she most likely has central hypothyroidism and in this case TSH levels are not reliable - latest thyroid labs reviewed with pt. >> normal free T4 - she continues on Synthroid d.a.w. 112 mcg daily - pt feels good on this dose. - we discussed about taking the thyroid hormone every day, with water, >30 minutes before breakfast, separated by >4 hours from acid reflux medications, calcium, iron, multivitamins. Pt. is taking it  correctly. - will check thyroid tests today: fT4 - If labs are abnormal, she will need to return for  repeat TFTs in 1.5 months  2. H/o Pituitary apoplexy -She is status post transsphenoidal resection of pituitary tumor after an episode of hemorrhage.  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  Her visual capacity normalized after surgery.  Visual field greatly improved in 2017.  She is followed by neurosurgery, Dr. Kathyrn Sheriff-  MRI in 09/2017 showed stable scar tissue versus recurrence in the sella, but it was felt that this was most likely scar tissue.  On the MRI from 08/2019, there was no suspicious mass. A VF was intact.  She had another MRI on 11/10/2021 which showed a 2 mm shrunk mass -Reviewing her pituitary lab results from 05/2019, they were normal except for a low TSH, most likely indicating central, rather than primary hypothyroidism -Pituitary labs were normal at last visit.  Would not repeat these today since there were no new symptoms.  Plan to repeat these at next visit. -I will see her back in 1 year  3. Prediabetes -She gained 6 pounds at last visit -At last visit, HbA1c was slightly higher, at 6.1%, increased from 6.0% -We discussed about improving diet: Decreasing sweets and cutting out dairy if possible -Will recheck her HbA1c today  Needs to lay down during lab draw, also use smallest needle.   Component     Latest Ref Rng 08/10/2022  T4,Free(Direct)     0.60 - 1.60 ng/dL 0.83   Hemoglobin A1C     4.6 - 6.5 % 6.3   HbA1c is slightly higher.  Will ask her to pay attention to the diet. Free T4 is slightly lower than before, but still within the normal range for now we can continue the same dose of Synthroid.  Philemon Kingdom, MD PhD [facility] Endocrinology

## 2022-09-08 DIAGNOSIS — J3489 Other specified disorders of nose and nasal sinuses: Secondary | ICD-10-CM | POA: Diagnosis not present

## 2022-09-08 DIAGNOSIS — H9202 Otalgia, left ear: Secondary | ICD-10-CM | POA: Diagnosis not present

## 2022-09-08 DIAGNOSIS — R0981 Nasal congestion: Secondary | ICD-10-CM | POA: Diagnosis not present

## 2022-09-08 DIAGNOSIS — R051 Acute cough: Secondary | ICD-10-CM | POA: Diagnosis not present

## 2022-11-28 DIAGNOSIS — F411 Generalized anxiety disorder: Secondary | ICD-10-CM | POA: Diagnosis not present

## 2022-11-28 DIAGNOSIS — Z01419 Encounter for gynecological examination (general) (routine) without abnormal findings: Secondary | ICD-10-CM | POA: Diagnosis not present

## 2022-11-28 DIAGNOSIS — Z124 Encounter for screening for malignant neoplasm of cervix: Secondary | ICD-10-CM | POA: Diagnosis not present

## 2022-11-28 DIAGNOSIS — Z1231 Encounter for screening mammogram for malignant neoplasm of breast: Secondary | ICD-10-CM | POA: Diagnosis not present

## 2022-11-28 DIAGNOSIS — N760 Acute vaginitis: Secondary | ICD-10-CM | POA: Diagnosis not present

## 2022-11-28 DIAGNOSIS — Z6833 Body mass index (BMI) 33.0-33.9, adult: Secondary | ICD-10-CM | POA: Diagnosis not present

## 2022-11-28 DIAGNOSIS — F410 Panic disorder [episodic paroxysmal anxiety] without agoraphobia: Secondary | ICD-10-CM | POA: Diagnosis not present

## 2022-11-29 ENCOUNTER — Encounter: Payer: Self-pay | Admitting: Gastroenterology

## 2022-11-29 ENCOUNTER — Other Ambulatory Visit: Payer: Self-pay

## 2022-11-29 MED ORDER — OMEPRAZOLE 40 MG PO CPDR: 40.0000 mg | DELAYED_RELEASE_CAPSULE | Freq: Every day | ORAL | 1 refills | Status: DC

## 2022-12-03 ENCOUNTER — Other Ambulatory Visit: Payer: Self-pay | Admitting: Internal Medicine

## 2023-02-09 DIAGNOSIS — L821 Other seborrheic keratosis: Secondary | ICD-10-CM | POA: Diagnosis not present

## 2023-02-09 DIAGNOSIS — D2371 Other benign neoplasm of skin of right lower limb, including hip: Secondary | ICD-10-CM | POA: Diagnosis not present

## 2023-02-09 DIAGNOSIS — L738 Other specified follicular disorders: Secondary | ICD-10-CM | POA: Diagnosis not present

## 2023-02-09 DIAGNOSIS — L814 Other melanin hyperpigmentation: Secondary | ICD-10-CM | POA: Diagnosis not present

## 2023-04-26 DIAGNOSIS — R8290 Unspecified abnormal findings in urine: Secondary | ICD-10-CM | POA: Diagnosis not present

## 2023-04-26 DIAGNOSIS — N760 Acute vaginitis: Secondary | ICD-10-CM | POA: Diagnosis not present

## 2023-05-22 DIAGNOSIS — F411 Generalized anxiety disorder: Secondary | ICD-10-CM | POA: Diagnosis not present

## 2023-05-22 DIAGNOSIS — F410 Panic disorder [episodic paroxysmal anxiety] without agoraphobia: Secondary | ICD-10-CM | POA: Diagnosis not present

## 2023-05-31 ENCOUNTER — Encounter: Payer: BC Managed Care – PPO | Admitting: Family Medicine

## 2023-06-25 ENCOUNTER — Telehealth: Payer: Self-pay | Admitting: Internal Medicine

## 2023-06-25 MED ORDER — SYNTHROID 112 MCG PO TABS: ORAL_TABLET | ORAL | 1 refills | Status: DC

## 2023-06-25 NOTE — Telephone Encounter (Signed)
 Requested Prescriptions   Signed Prescriptions Disp Refills   SYNTHROID 112 MCG tablet 90 tablet 1    Sig: TAKE 1 TABLET BY MOUTH DAILY BEFORE BREAKFAST.    Authorizing Provider: Carlus Pavlov    Ordering User: Pollie Meyer

## 2023-06-25 NOTE — Telephone Encounter (Signed)
 MEDICATION: Synthroid  SYNTHROID  112 MCG tablet  PHARMACY:    [id number] [place], [place] - [address] AT [facility] [address] & [facility] [address] (Ph: [phone])    HAS THE PATIENT CONTACTED THEIR PHARMACY?  Yes  IS THIS A 90 DAY SUPPLY : Yes  IS PATIENT OUT OF MEDICATION: Yes  IF NOT; HOW MUCH IS LEFT:   LAST APPOINTMENT DATE: @ 08/10/2022  NEXT APPOINTMENT DATE:@2 /25/2025  DO WE HAVE YOUR PERMISSION TO LEAVE A DETAILED MESSAGE?: Yes  OTHER COMMENTS:    **Let patient know to contact pharmacy at the end of the day to make sure medication is ready. **  ** Please notify patient to allow 48-72 hours to process**  **Encourage patient to contact the pharmacy for refills or they can request refills through [facility]**

## 2023-07-05 ENCOUNTER — Encounter: Payer: BC Managed Care – PPO | Admitting: Family Medicine

## 2023-08-14 ENCOUNTER — Encounter: Payer: Self-pay | Admitting: Internal Medicine

## 2023-08-14 ENCOUNTER — Ambulatory Visit (INDEPENDENT_AMBULATORY_CARE_PROVIDER_SITE_OTHER): Payer: BC Managed Care – PPO | Admitting: Internal Medicine

## 2023-08-14 VITALS — BP 120/80 | HR 83 | Ht 64.0 in | Wt 178.0 lb

## 2023-08-14 DIAGNOSIS — R7303 Prediabetes: Secondary | ICD-10-CM | POA: Diagnosis not present

## 2023-08-14 DIAGNOSIS — E039 Hypothyroidism, unspecified: Secondary | ICD-10-CM

## 2023-08-14 DIAGNOSIS — E236 Other disorders of pituitary gland: Secondary | ICD-10-CM

## 2023-08-14 NOTE — Progress Notes (Addendum)
 Patient ID: Alyssa Bartlett, female   DOB: 01/15/1971, 53 y.o.   MRN: [id number]  HPI  Alyssa Bartlett is a 53 y.o.-year-old female, returning for f/u for hypothyroidism and pituitary apoplexy. Last visit 1 year ago.  Interim history: She denies increased fatigue, headaches, visual disturbance.  Since last visit, she started a low-carb diet along with her husband.  She lost approximately 15 pounds, despite the holidays.  Hypothyroidism:  Reviewed history: Pt. has been dx with hypothyroidism in 2011; started Synthroid, then Levothyroxine >> did not feel good (aches, pains, fatigue) she was seeing [facility] providers and also saw endocrinology before; was on NatureThroid 48.7 mg bid (~160 mcg LT4 daily) >> was feeling better, weight normalized >> however, had increased anxiety - after an allergic rxn (or Niacin flush - was taking a B complex at that time), worse fatigue, insomnia. She is on Synthroid. Lexapro helped the above sxs.  Pt is on Synthroid 112 mcg daily, taken: - in am - fasting - at least 30 min from b'fast - no calcium - no iron - no multivitamins - + PPIs (Prilosec)-in pm (takes this daily) - not on Biotin  Reviewed patient's TFTs.  We usually follow her by free T4 levels since TSH levels are unreliable for her Lab Results  Component Value Date   TSH 0.05 (L) 05/07/2020   TSH 0.03 (L) 06/17/2019   TSH 0.40 05/03/2018   TSH 0.09 (L) 09/27/2017   TSH 0.01 Repeated and verified X2. 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  - MRI (03/26/2015): The pituitary is enlarged with a fluid level evident on T2 weighted images. Blood products are present in the pituitary compatible with pituitary apoplexy. The gland measures 26 mm cephalo caudad dimension. It measures 19 x 19 mm in cross-sectional dimension. This displaces the optic chiasm superiorly. There is peripheral enhancement about the gland.   - s/p TSR b/c of visual loss (Dr. Conchita Paris)  - MRI (03/28/2015): Sequelae of recent transsphenoidal pituitary resection are identified. Surgical packing material is present in the posterior nasal cavity. The surgical cavity in the sella/ suprasellar cistern measures approximately 19 x 16 mm and demonstrates uniform peripheral enhancement of 3-4 mm in thickness, with anterior aspect open to the nasal cavity. The pituitary infundibulum is displaced superiorly and slightly rightward by these postoperative changes. No discrete nodular enhancement/residual mass is identified. There is no mass effect on the optic chiasm. There may be mild displacement of the prechiasmatic optic nerves, left more so than right.   Pituitary labs were normal with the exception of the pituitary-thyroid axis: Component     Latest Ref Rng 03/28/2015 04/09/2015  Cortisol - AM     6.7 - 22.6 ug/dL 40.9   TSH     8.11 - 9.14  uIU/mL  0.15 (L)  Free T4     0.60 - 1.60 ng/dL  0.34 (H)  T3, Free     2.3 - 4.2 pg/mL  3.4  Cortisol, Plasma       11.5  C206 ACTH     6 - 50 pg/mL  21  Prolactin       5.3  She denies increased thirst or urination  Visual field test at the end of 2016 >> significant improvement in vision after surgery. 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  09/16/2015: MRI pituitary: showed perhaps minimal peripheral residual  tumor but otherwise a gross resection, mild sphenoid and right maxillary T2-hyperintense mucous retention cysts, and otherwise nicely patent and well-healed sinusotomies.   Component     Latest Ref Rng 10/28/2015  TSH     0.35 - 4.50 uIU/mL 0.07 (L)  T4,Free(Direct)     0.60 - 1.60 ng/dL 7.42  Prolactin      7.1  Cortisol, Plasma      15.0  LH      2.55  FSH      4.9    MRI (08/2016): Stable residual tumor versus scar tissue, but believed to be more consistent with scar tissue.  Component     Latest Ref Rng & Units 09/27/2017  IGF-I, LC/MS     52 - 328 ng/mL 196  Z-Score (Female)     -2.0 - 2 SD 0.7  TSH     0.35 - 4.50 uIU/mL 0.09 (L)  Prolactin     ng/mL 8.3  LH     mIU/mL 0.96  FSH     mIU/ML 3.4  C206 ACTH     6 - 50 pg/mL 28  Cortisol, Plasma     ug/dL 8.6  Triiodothyronine,Free,Serum     2.3 - 4.2 pg/mL 3.4  T4,Free(Direct)     0.60 - 1.60 ng/dL 5.95    MRI (63/8756): stable  Component     Latest Ref Rng & Units 05/03/2018  IGF-I, LC/MS     52 - 328 ng/mL 147  Z-Score (Female)     -2.0 - 2 SD 0.1  TSH     0.35 - 4.50 uIU/mL 0.40  Prolactin     ng/mL 9.4  LH     mIU/mL 0.76  FSH     mIU/ML 3.0  T4,Free(Direct)     0.60 - 1.60 ng/dL 4.33  Triiodothyronine,Free,Serum     2.3 - 4.2 pg/mL 3.8  Cortisol, Plasma     ug/dL 6.4  I951 ACTH     6 - 50 pg/mL 21   Component     Latest Ref Rng & Units 06/17/2019  IGF-I, LC/MS     52 - 328 ng/mL 135  Z-Score (Female)     -2.0 - 2 SD -0.1  C206 ACTH     6 - 50 pg/mL 21  Cortisol, Plasma     ug/dL 88.4  LH     mIU/mL 16.60  FSH     mIU/ML 5.5  T4,Free(Direct)     0.60 - 1.60 ng/dL 6.30  TSH     1.60 - 1.09 uIU/mL 0.03 (L)   Component     Latest Ref Rng & Units 06/28/2021  IGF-I, LC/MS     50 - 317 ng/mL 126  Z-Score (Female)     -2.0 - 2.0 SD -0.2  T4,Free(Direct)     0.60 - 1.60 ng/dL 3.23  Prolactin     ng/mL  9.0  C206 ACTH     6 - 50 pg/mL 31  Cortisol, Plasma     ug/dL 9.6   Growth Hormone     <OR = 7.1 ng/mL 0.1   MRI (09/04/2019):   1. Continued stable postoperative appearance of the pituitary and regional soft tissues.  2. Otherwise normal MRI appearance of the brain.  Component     Latest Ref Rng & Units 06/28/2021  IGF-I, LC/MS     50 - 317 ng/mL 126  Z-Score (Female)     -2.0 - 2.0 SD -0.2  T4,Free(Direct)     0.60 - 1.60 ng/dL 1.61  Prolactin     ng/mL 9.0  C206 ACTH     6 - 50 pg/mL 31  Cortisol, Plasma     ug/dL 9.6  Growth Hormone     <OR = 7.1 ng/mL 0.1   Pituitary MRI (11/10/2021):   She had another VF checked by "[facility]" in 2023.  She also has  prediabetes: Lab Results  Component Value Date   HGBA1C 6.3 08/10/2022   HGBA1C 6.1 06/28/2021   HGBA1C 6.0 05/07/2020   HGBA1C 5.8 06/17/2019   HGBA1C 5.5 03/12/2015   She has a history of hyperlipidemia: Lab Results  Component Value Date   CHOL 230 (H) 11/18/2021   HDL 61.30 11/18/2021   LDLCALC 139 (H) 11/18/2021   TRIG 146.0 11/18/2021   CHOLHDL 4 11/18/2021    ROS: + see HPI  I reviewed pt's medications, allergies, PMH, social hx, family hx, and changes were documented in the history of present illness. Otherwise, unchanged from my initial visit note.  Past Medical History  Diagnosis Date   Hypothyroidism     Mood disorder (HCC)     GAD, panic, depression   History of chicken pox    Allergic reaction to food     ? shellfish   Prediabetes    Mild hyperlipidemia    Tobacco use    Past Surgical History:  Procedure Laterality Date   COLONOSCOPY     CRANIOTOMY N/A 03/26/2015   Procedure: CRANIOTOMY HYPOPHYSECTOMY TRANSNASAL APPROACH;  Surgeon: Lisbeth Renshaw, MD;  Location: [facility];  Service: Neurosurgery;  Laterality: N/A;   POLYPECTOMY     SINUS ENDO W/FUSION N/A 03/26/2015   Procedure: ENDOSCOPIC SINUS SURGERY WITH NAVIGATION;  Surgeon: Melvenia Beam, MD;  Location: [facility];  Service: ENT;  Laterality: N/A;   History   Social History    Marital Status: Single    Spouse Name: N/A   Number of Children: 0   Occupational History   intake speacialists    Social History Main Topics   Smoking status: Former Smoker -- 1.00 packs/day for 14 years    Types: Cigarettes    Start date: 07/20/2008   Smokeless tobacco: Not on file   Alcohol Use: Yes     Comment: wine 2-3 drinks 4-5x a week   Drug Use: No   Social History Narrative   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  LORazepam (ATIVAN) 0.5 MG tablet Take 0.5 mg by mouth 2 (two) times daily. Take 1 tablet in the morning and half tablet in the evening     methylcellulose (CITRUCEL) oral powder Take 1 packet by mouth daily.     omeprazole (PRILOSEC) 40 MG capsule Take 1 capsule (40 mg total) by mouth daily. 90 capsule 1   Peppermint Oil (IBGARD) 90 MG CPCR Take as directed 12 capsule 0   SYNTHROID 112 MCG tablet TAKE 1 TABLET BY MOUTH DAILY BEFORE BREAKFAST. 90 tablet 1   Current Facility-Administered Medications on File Prior to Visit  Medication Dose Route Frequency Provider Last Rate Last Admin   0.9 %  sodium chloride infusion  500 mL Intravenous Once Armbruster, Willaim Rayas, MD       No Known Allergies Family History  Problem Relation Age of Onset   Allergic rhinitis Mother    Hyperlipidemia Mother    Diabetes Mother    Arthritis Mother    High blood pressure Mother    Allergic rhinitis Father    Prostate cancer Father    Hyperlipidemia Father    Heart disease Father 13       CABG   Heart attack Father 70   Diverticulitis Father        had surgery   Diabetes Father    Colon polyps Father     Arthritis Father    High blood pressure Father    Heart disease Maternal Grandfather    Alzheimer's disease Maternal Grandfather    Colon cancer Neg Hx    Rectal cancer Neg Hx    Esophageal cancer Neg Hx    Stomach cancer Neg Hx   HTN in M and F  PE: BP 120/80   Pulse 83   Ht 5\' 4"  (1.626 m)   Wt 178 lb (80.7 kg)   SpO2 96%   BMI 30.55 kg/m  Wt Readings from Last 3 Encounters:  08/14/23 178 lb (80.7 kg)  08/10/22 192 lb 9.6 oz (87.4 kg)  05/23/22 192 lb 4 oz (87.2 kg)   Constitutional: overweight, in NAD Eyes:  EOMI, no exophthalmos ENT: no neck masses, no cervical lymphadenopathy Cardiovascular: RRR, No MRG Respiratory: CTA B Musculoskeletal: no deformities Skin:no rashes Neurological: no tremor with outstretched hands  ASSESSMENT: 1.  Central hypothyroidism  01/01/2015: Thyroid U/S:  Right thyroid lobe: 3.3 cm x 0.8 cm x 0.9 cm. Small hypoechoic nodule at the inferior right thyroid measures approximately 3 mm. Heterogeneous appearance of the thyroid tissue Left thyroid lobe: 3.1 cm x 0.6 cm x 0.8 cm. No nodules. Heterogeneous appearance of the thyroid tissue. Isthmus Thickness: 3 mm.  No nodules visualized. Lymphadenopathy: None visualized. IMPRESSION: Relatively unremarkable appearance of the thyroid with single small benign-appearing nodule on the right. No left-sided nodules.  2. H/o Pituitary tumor with apoplexy  3.Prediabetes  PLAN:  1. Patient with longstanding hypothyroidism, previously on Nature-Throid, but now on Synthroid.  We are following her by the free T4 level since she most likely has central hypothyroidism and in this case TSH levels are not reliable - latest thyroid labs reviewed with pt. >> TSH was low, but we are normally following her by free T4.  This was normal at last check: Lab Results  Component Value Date   FREET4 0.83 08/10/2022  - she continues on LT4 112 mcg daily - pt feels good on this dose. - we discussed about taking the  thyroid hormone every day, with water, >30 minutes before breakfast,  separated by >4 hours from acid reflux medications, calcium, iron, multivitamins. Pt. is taking it correctly. - will check thyroid tests today:  fT4 and will also check a TSH, but will guide the LT4 dose on the free T4 - If labs are abnormal, she will need to return for repeat TFTs in 1.5 months - Synthroid is expensive.  We discussed about either switching to levothyroxine or obtaining it from Synthroid delivers program.  Given handout.  She will let me know if she wants to go through them. - OTW, I will see her back in a year  2. H/o Pituitary apoplexy -She is status post transsphenoidal resection of pituitary tumor after an episode of hemorrhage.  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  Her visual capacity normalized after surgery.  Visual field greatly improved in 2017.  She is followed by neurosurgery, Dr. Conchita Paris-  MRI in 09/2017 showed stable scar tissue versus recurrence in the sella, but it was felt that this was most likely scar tissue.  On the MRI from 08/2019, there was no suspicious mass. A VF was intact.  She had another MRI on 11/10/2021 which showed a 2 mm shrunk mass.  She continues to see Dr. Conchita Paris -believes she has another appointment in a year -At last visit we did not check her pituitary labs, but they were previously normal from 2016-2023 with the exception of TSH -Will repeat these today -We discussed about the need for follow-up with every 1 to 3 years pituitary hormone check usually continued long-term  3. Prediabetes -She gained 6 pounds before last visit but lost 14 pounds since last visit -A1c was higher at last visit, at 6.3%, increased from 6.1% -At last visit we discussed about improving diet: Decreasing sweets and cutting out dairy if possible. -will recheck today  Orders Placed This Encounter  Procedures   TSH   T4, free   Prolactin   Insulin-like growth factor    Cortisol   ACTH   Luteinizing hormone   Follicle stimulating hormone   Hemoglobin A1c   Needs to lay down during lab draw, also use smallest needle.    Component     Latest Ref Rng 08/14/2023  Cortisol, Plasma     mcg/dL 52.8   U1,LKGM(WNUUVO)     0.8 - 1.8 ng/dL 1.5   TSH     mIU/L 5.36 (L)   Hemoglobin A1C     <5.7 % of total Hgb 6.1 (H)   LH     mIU/mL 4.7   FSH     mIU/mL 10.8   C206 ACTH     6 - 50 pg/mL 30   IGF-I, LC/MS     50 - 317 ng/mL 131   Z-Score (Female)     -2.0 - 2.0 SD -0.1   Prolactin     ng/mL 5.8   Mean Plasma Glucose     mg/dL 644   eAG (mmol/L)     mmol/L 7.1     HbA1c is better.  Free T4, cortisol, ACTH, prolactin, IGF-I are all normal.  TSH is suppressed, consistent with central hypothyroidism.  LH and FSH are lower than expected for menopause.   Carlus Pavlov, MD PhD [facility] Endocr[facility]

## 2023-08-14 NOTE — Patient Instructions (Addendum)
 Please stop at the lab.  Please continue Synthroid 112 mcg daily.  Take the thyroid hormone every day, with water, at least 30 minutes before breakfast, separated by at least 4 hours from: - acid reflux medications - calcium - iron - multivitamins  Do not take Synthroid before labs.  Please come back for a follow-up appointment in 1 year.

## 2023-08-16 ENCOUNTER — Encounter: Payer: Self-pay | Admitting: Internal Medicine

## 2023-08-20 LAB — TSH: TSH: 0.01 m[IU]/L — ABNORMAL LOW

## 2023-08-20 LAB — CORTISOL: Cortisol, Plasma: 10.8 ug/dL

## 2023-08-20 LAB — T4, FREE: Free T4: 1.5 ng/dL (ref 0.8–1.8)

## 2023-08-20 LAB — FOLLICLE STIMULATING HORMONE: FSH: 10.8 m[IU]/mL

## 2023-08-20 LAB — HEMOGLOBIN A1C
Hgb A1c MFr Bld: 6.1 %{Hb} — ABNORMAL HIGH (ref <5.7)
Mean Plasma Glucose: 128 mg/dL
eAG (mmol/L): 7.1 mmol/L

## 2023-08-20 LAB — INSULIN-LIKE GROWTH FACTOR
IGF-I, LC/MS: 131 ng/mL (ref 50–317)
Z-Score (Female): -0.1 {STDV} (ref ?–2.0)

## 2023-08-20 LAB — LUTEINIZING HORMONE: LH: 4.7 m[IU]/mL

## 2023-08-20 LAB — PROLACTIN: Prolactin: 5.8 ng/mL

## 2023-08-20 LAB — ACTH: C206 ACTH: 30 pg/mL (ref 6–50)

## 2023-09-12 ENCOUNTER — Encounter: Payer: Self-pay | Admitting: Family Medicine

## 2023-09-12 ENCOUNTER — Ambulatory Visit (INDEPENDENT_AMBULATORY_CARE_PROVIDER_SITE_OTHER): Payer: BC Managed Care – PPO | Admitting: Family Medicine

## 2023-09-12 VITALS — BP 134/84 | HR 75 | Temp 97.6°F | Ht 64.0 in | Wt 176.0 lb

## 2023-09-12 DIAGNOSIS — K219 Gastro-esophageal reflux disease without esophagitis: Secondary | ICD-10-CM

## 2023-09-12 DIAGNOSIS — Z8249 Family history of ischemic heart disease and other diseases of the circulatory system: Secondary | ICD-10-CM | POA: Diagnosis not present

## 2023-09-12 DIAGNOSIS — E7800 Pure hypercholesterolemia, unspecified: Secondary | ICD-10-CM | POA: Diagnosis not present

## 2023-09-12 DIAGNOSIS — Z0001 Encounter for general adult medical examination with abnormal findings: Secondary | ICD-10-CM

## 2023-09-12 DIAGNOSIS — Z0000 Encounter for general adult medical examination without abnormal findings: Secondary | ICD-10-CM

## 2023-09-12 DIAGNOSIS — Z136 Encounter for screening for cardiovascular disorders: Secondary | ICD-10-CM | POA: Insufficient documentation

## 2023-09-12 DIAGNOSIS — Z87891 Personal history of nicotine dependence: Secondary | ICD-10-CM | POA: Insufficient documentation

## 2023-09-12 DIAGNOSIS — R7303 Prediabetes: Secondary | ICD-10-CM

## 2023-09-12 LAB — LIPID PANEL
Cholesterol: 213 mg/dL — ABNORMAL HIGH (ref 0–200)
HDL: 58.2 mg/dL (ref >39.00)
LDL Cholesterol: 131 mg/dL — ABNORMAL HIGH (ref 0–99)
NonHDL: 154.98
Total CHOL/HDL Ratio: 4
Triglycerides: 118 mg/dL (ref 0.0–149.0)
VLDL: 23.6 mg/dL (ref 0.0–40.0)

## 2023-09-12 LAB — CBC
HCT: 40.9 % (ref 36.0–46.0)
Hemoglobin: 13.7 g/dL (ref 12.0–15.0)
MCHC: 33.5 g/dL (ref 30.0–36.0)
MCV: 92 fl (ref 78.0–100.0)
Platelets: 219 10*3/uL (ref 150.0–400.0)
RBC: 4.44 Mil/uL (ref 3.87–5.11)
RDW: 12.9 % (ref 11.5–15.5)
WBC: 4.2 10*3/uL (ref 4.0–10.5)

## 2023-09-12 LAB — COMPREHENSIVE METABOLIC PANEL
ALT: 18 U/L (ref 0–35)
AST: 16 U/L (ref 0–37)
Albumin: 4.6 g/dL (ref 3.5–5.2)
Alkaline Phosphatase: 66 U/L (ref 39–117)
BUN: 15 mg/dL (ref 6–23)
CO2: 30 meq/L (ref 19–32)
Calcium: 9.7 mg/dL (ref 8.4–10.5)
Chloride: 105 meq/L (ref 96–112)
Creatinine, Ser: 0.66 mg/dL (ref 0.40–1.20)
GFR: 100.51 mL/min (ref >60.00)
Glucose, Bld: 106 mg/dL — ABNORMAL HIGH (ref 70–99)
Potassium: 4.8 meq/L (ref 3.5–5.1)
Sodium: 142 meq/L (ref 135–145)
Total Bilirubin: 0.4 mg/dL (ref 0.2–1.2)
Total Protein: 7.2 g/dL (ref 6.0–8.3)

## 2023-09-12 NOTE — Assessment & Plan Note (Signed)
 CT coronary calcium test ordered. Counseling on diet low in saturated fat and getting regular exercise. Check fasting lipids.

## 2023-09-12 NOTE — Patient Instructions (Addendum)
 Please go downstairs for labs before you leave.  You will hear regarding your CT coronary calcium test.  This test also looks at your lungs and vascular system.   Continue eating a healthy diet and exercise.  Consider more plant-based and reducing saturated fat, animal products and fried food.  Aim for at least 150 minutes of vigorous physical activity each week.  Consider getting the shingles vaccines.  Information provided below.  We will be in touch with your lab results.   Recombinant Zoster (Shingles) Vaccine: What You Need to Know Many vaccine information statements are available in Spanish and other languages. See PromoAge.com.br. 1. Why get vaccinated? Recombinant zoster (shingles) vaccine can prevent shingles. Shingles (also called herpes zoster, or just zoster) is a painful skin rash, usually with blisters. In addition to the rash, shingles can cause fever, headache, chills, or upset stomach. Rarely, shingles can lead to complications such as pneumonia, hearing problems, blindness, brain inflammation (encephalitis), or death. The risk of shingles increases with age. The most common complication of shingles is long-term nerve pain called postherpetic neuralgia (PHN). PHN occurs in the areas where the shingles rash was and can last for months or years after the rash goes away. The pain from PHN can be severe and debilitating. The risk of PHN increases with age. An older adult with shingles is more likely to develop PHN and have longer lasting and more severe pain than a younger person. People with weakened immune systems also have a higher risk of getting shingles and complications from the disease. Shingles is caused by varicella-zoster virus, the same virus that causes chickenpox. After you have chickenpox, the virus stays in your body and can cause shingles later in life. Shingles cannot be passed from one person to another, but the virus that causes shingles can spread and  cause chickenpox in someone who has never had chickenpox or has never received chickenpox vaccine. 2. Recombinant shingles vaccine Recombinant shingles vaccine provides strong protection against shingles. By preventing shingles, recombinant shingles vaccine also protects against PHN and other complications. Recombinant shingles vaccine is recommended for: Adults 50 years and older Adults 19 years and older who have a weakened immune system because of disease or treatments Shingles vaccine is given as a two-dose series. For most people, the second dose should be given 2 to 6 months after the first dose. Some people who have or will have a weakened immune system can get the second dose 1 to 2 months after the first dose. Ask your health care provider for guidance. People who have had shingles in the past and people who have received varicella (chickenpox) vaccine are recommended to get recombinant shingles vaccine. The vaccine is also recommended for people who have already gotten another type of shingles vaccine, the live shingles vaccine. There is no live virus in recombinant shingles vaccine. Shingles vaccine may be given at the same time as other vaccines. 3. Talk with your health care provider Tell your vaccination provider if the person getting the vaccine: Has had an allergic reaction after a previous dose of recombinant shingles vaccine, or has any severe, life-threatening allergies Is currently experiencing an episode of shingles Is pregnant In some cases, your health care provider may decide to postpone shingles vaccination until a future visit. People with minor illnesses, such as a cold, may be vaccinated. People who are moderately or severely ill should usually wait until they recover before getting recombinant shingles vaccine. Your health care provider can give you more  information. 4. Risks of a vaccine reaction A sore arm with mild or moderate pain is very common after recombinant  shingles vaccine. Redness and swelling can also happen at the site of the injection. Tiredness, muscle pain, headache, shivering, fever, stomach pain, and nausea are common after recombinant shingles vaccine. These side effects may temporarily prevent a vaccinated person from doing regular activities. Symptoms usually go away on their own in 2 to 3 days. You should still get the second dose of recombinant shingles vaccine even if you had one of these reactions after the first dose. Guillain-Barr syndrome (GBS), a serious nervous system disorder, has been reported very rarely after recombinant zoster vaccine. People sometimes faint after medical procedures, including vaccination. Tell your provider if you feel dizzy or have vision changes or ringing in the ears. As with any medicine, there is a very remote chance of a vaccine causing a severe allergic reaction, other serious injury, or death. 5. What if there is a serious problem? An allergic reaction could occur after the vaccinated person leaves the clinic. If you see signs of a severe allergic reaction (hives, swelling of the face and throat, difficulty breathing, a fast heartbeat, dizziness, or weakness), call 9-1-1 and get the person to the nearest hospital. For other signs that concern you, call your health care provider. Adverse reactions should be reported to the Vaccine Adverse Event Reporting System (VAERS). Your health care provider will usually file this report, or you can do it yourself. Visit the VAERS website at www.vaers.LAgents.no or call 7147112430. VAERS is only for reporting reactions, and VAERS staff members do not give medical advice. 6. How can I learn more? Ask your health care provider. Call your local or state health department. Visit the website of the Food and Drug Administration (FDA) for vaccine package inserts and additional information at GoldCloset.com.ee. Contact the Centers for Disease  Control and Prevention (CDC): Call (952)541-1786 (1-800-CDC-INFO) or Visit CDC's website at PicCapture.uy. Source: CDC Vaccine Information Statement Recombinant Zoster Vaccine (07/23/2020) This same material is available at FootballExhibition.com.br for no charge. This information is not intended to replace advice given to you by your health care provider. Make sure you discuss any questions you have with your health care provider. Document Revised: 09/20/2022 Document Reviewed: 06/21/2022 Elsevier Patient Education  2024 [employer].

## 2023-09-12 NOTE — Assessment & Plan Note (Signed)
 Doing well. Avoid triggers.   Follow-up with GI as needed

## 2023-09-12 NOTE — Assessment & Plan Note (Signed)
 CT coronary calcium test ordered. Counseling on diet low in saturated fat and exercise. Check fasting lipids and risks for heart disease.

## 2023-09-12 NOTE — Assessment & Plan Note (Signed)
 Preventive health care reviewed.  She sees her OB/GYN and is scheduled for mammogram and Pap smear.  Up-to-date on colonoscopy. Counseling on healthy lifestyle including diet and exercise.  Immunizations reviewed.  She may follow-up for Shingrix vaccine at her convenience or get this at her pharmacy.  Encouraged her to check with her insurance regarding this vaccine. Follow-up pending lab results

## 2023-09-12 NOTE — Assessment & Plan Note (Signed)
 Asymptomatic.

## 2023-09-12 NOTE — Assessment & Plan Note (Signed)
 EKG shows NSR, rate 70. No acute ST-T wave changes from 2017 EKG.  CT coronary calcium score test ordered.  Lipids ordered.  Counseling on healthy diet and exercise.

## 2023-09-12 NOTE — Progress Notes (Signed)
 Complete physical exam  Patient: Alyssa Bartlett   DOB: 1970/11/16   53 y.o. Female  MRN: [id number]  Subjective:    Chief Complaint  Patient presents with   Annual Exam    fasting   She is here for a complete physical exam.  Other providers:  OB/GYN - [facility] -appt June for pap and mammogram  GI- Dr. Adela Lank. Colonoscopy due in August 2025.  Also treating her for GERD Dr. Lafe Garin - hypothyroidism, prediabetes, and pituitary tumor Eye doctor- [facility] Dentist - regular visits Dermatologist - at [facility] in August 2025  Psychiatrist - Dr. Milagros Evener for Panic disorder and GAD. Taking Lexapro daily and Ativan daily and Dr. Evelene Croon is prescribing these medications.   She is eating a healthier diet and exercising. Working on [employer] loss   Former smoker. Stopped in 2011 at age 32. 1 ppd for at least 14 years  Asymptomatic   Father with CABG in his 79s.   Personal Hx of HLD  She is interested in the CT Coronary calcium test      Health Maintenance  Topic Date Due   Zoster (Shingles) Vaccine (1 of 2) Never done   Pap with HPV screening  Never done   Mammogram  Never done   Flu Shot  09/17/2023*   COVID-19 Vaccine (4 - 2024-25 season) 09/28/2023*   HIV Screening  10/06/2024*   Colon Cancer Screening  02/16/2024   DTaP/Tdap/Td vaccine (2 - Td or Tdap) 07/24/2028   Hepatitis C Screening  Completed   HPV Vaccine  Aged Out  *Topic was postponed. The date shown is not the original due date.    Wears seatbelt always, uses sunscreen, smoke detectors in home and functioning, does not text while driving, feels safe in home environment.  Depression screening:    09/12/2023    8:27 AM 11/18/2021    9:56 AM  Depression screen PHQ 2/9  Decreased Interest 0 0  Down, Depressed, Hopeless 0 0  PHQ - 2 Score 0 0   Anxiety Screening:     No data to display          Vision:Within last year and Dental: No current dental problems and Receives regular dental  care  Patient Active Problem List   Diagnosis Date Noted   Former smoker 09/12/2023   Pure hypercholesterolemia 09/12/2023   Family history of heart disease in female family member before age 90 09/12/2023   Screening for heart disease 09/12/2023   Left lower quadrant abdominal pain 01/02/2022   Low grade fever 01/02/2022   Abnormal bowel movement 01/02/2022   Sigmoid diverticulitis 01/02/2022   Left lower quadrant abdominal tenderness with rebound tenderness 01/02/2022   Routine general medical examination at a health care facility 11/18/2021   Prediabetes 11/18/2021   Obesity (BMI 30-39.9) 11/18/2021   Need for hepatitis C screening test 11/18/2021   GERD (gastroesophageal reflux disease) 10/28/2019   Anxiety 10/28/2019   Constipation 09/17/2015   Mass of abdomen 06/05/2015   Pituitary adenoma (HCC) 04/19/2015   Hypothyroidism 04/12/2015   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 shellfish   Anxiety  GERD (gastroesophageal reflux disease)    Hemorrhoid 09/17/2015   History of chicken pox    Hypothyroidism    managed by [facility]   Mild hyperlipidemia    no meds   Mood disorder (HCC)    GAD, panic, depression   Prediabetes    Tobacco use    Past Surgical History:  Procedure Laterality Date   COLONOSCOPY     CRANIOTOMY N/A 03/26/2015   Procedure: CRANIOTOMY HYPOPHYSECTOMY TRANSNASAL APPROACH;  Surgeon: Lisbeth Renshaw, MD;  Location: [facility];  Service: Neurosurgery;  Laterality: N/A;   POLYPECTOMY     SINUS ENDO W/FUSION N/A 03/26/2015   Procedure: ENDOSCOPIC SINUS SURGERY WITH  NAVIGATION;  Surgeon: Melvenia Beam, MD;  Location: [facility];  Service: ENT;  Laterality: N/A;   Social History   Tobacco Use   Smoking status: Former    Current packs/day: 0.00    Average packs/day: 1 pack/day for 14.0 years (14.0 ttl pk-yrs)    Types: Cigarettes    Start date: 79    Quit date: 2011    Years since quitting: 14.2   Smokeless tobacco: Never   Tobacco comments:     Nicotine gum quit 04/2017  Vaping Use   Vaping status: Never Used  Substance Use Topics   Alcohol use: Yes    Alcohol/week: 5.0 - 7.0 standard drinks of alcohol    Types: 5 - 7 Glasses of wine per week    Comment: occ   Drug use: No      Patient Care Team: Avanell Shackleton, NP-C as PCP - General (Family Medicine) Zelphia Cairo, MD as Consulting Physician (Obstetrics and Gynecology)   Outpatient Medications Prior to Visit  Medication Sig   escitalopram (LEXAPRO) 20 MG tablet Take 20 mg by mouth at bedtime.   LORazepam (ATIVAN) 0.5 MG tablet Take 0.5 mg by mouth daily. Take 1 tablet in the morning.   omeprazole (PRILOSEC) 40 MG capsule Take 1 capsule (40 mg total) by mouth daily.   SYNTHROID 112 MCG tablet TAKE 1 TABLET BY MOUTH DAILY BEFORE BREAKFAST.   Peppermint Oil (IBGARD) 90 MG CPCR Take as directed (Patient not taking: Reported on 09/12/2023)   [DISCONTINUED] methylcellulose (CITRUCEL) oral powder Take 1 packet by mouth daily. (Patient not taking: Reported on 08/14/2023)   Facility-Administered Medications Prior to Visit  Medication Dose Route Frequency Provider   0.9 %  sodium chloride infusion  500 mL Intravenous Once Armbruster, Willaim Rayas, MD    Review of Systems  Constitutional:  Negative for chills, fever and malaise/fatigue.  HENT:  Negative for congestion, ear pain, sinus pain and sore throat.   Eyes:  Negative for blurred vision, double vision and pain.  Respiratory:  Negative for cough, shortness of breath and wheezing.   Cardiovascular:  Negative for chest pain,  palpitations and leg swelling.  Gastrointestinal:  Negative for abdominal pain, constipation, diarrhea, nausea and vomiting.  Genitourinary:  Negative for dysuria, frequency and urgency.  Musculoskeletal:  Negative for back pain, joint pain and myalgias.  Skin:  Negative for rash.  Neurological:  Negative for dizziness, tingling, focal weakness and headaches.  Psychiatric/Behavioral:  Negative for depression and suicidal ideas. The patient is not nervous/anxious.        Objective:    BP 134/84 (BP Location: Left Arm, Patient Position: Sitting)   Pulse 75   Temp 97.6 F (36.4 C) (Temporal)   Ht 5\' 4"  (1.626 m)   Wt 176 lb (79.8 kg)  SpO2 98%   BMI 30.21 kg/m  BP Readings from Last 3 Encounters:  09/12/23 134/84  08/14/23 120/80  08/10/22 130/80   Wt Readings from Last 3 Encounters:  09/12/23 176 lb (79.8 kg)  08/14/23 178 lb (80.7 kg)  08/10/22 192 lb 9.6 oz (87.4 kg)    Physical Exam Constitutional:      General: She is not in acute distress.    Appearance: She is not ill-appearing.  HENT:     Right Ear: Tympanic membrane, ear canal and external ear normal.     Left Ear: Tympanic membrane, ear canal and external ear normal.     Nose: Nose normal.     Mouth/Throat:     Mouth: Mucous membranes are moist.     Pharynx: Oropharynx is clear.  Eyes:     Extraocular Movements: Extraocular movements intact.     Conjunctiva/sclera: Conjunctivae normal.     Pupils: Pupils are equal, round, and reactive to light.  Neck:     Thyroid: No thyroid mass, thyromegaly or thyroid tenderness.  Cardiovascular:     Rate and Rhythm: Normal rate and regular rhythm.     Pulses: Normal pulses.     Heart sounds: Normal heart sounds.  Pulmonary:     Effort: Pulmonary effort is normal.     Breath sounds: Normal breath sounds.  Abdominal:     General: Bowel sounds are normal.     Palpations: Abdomen is soft.     Tenderness: There is no abdominal tenderness. There is no right CVA  tenderness, left CVA tenderness, guarding or rebound.  Musculoskeletal:        General: Normal range of motion.     Cervical back: Normal range of motion and neck supple. No tenderness.     Right lower leg: No edema.     Left lower leg: No edema.  Lymphadenopathy:     Cervical: No cervical adenopathy.  Skin:    General: Skin is warm and dry.     Findings: No lesion or rash.  Neurological:     General: No focal deficit present.     Mental Status: She is alert and oriented to person, place, and time.     Cranial Nerves: No cranial nerve deficit.     Sensory: No sensory deficit.     Motor: No weakness.     Gait: Gait normal.  Psychiatric:        Mood and Affect: Mood normal.        Behavior: Behavior normal.        Thought Content: Thought content normal.      No results found for any visits on 09/12/23.    Assessment & Plan:    Routine Health Maintenance and Physical Exam  Problem List Items Addressed This Visit     Family history of heart disease in female family member before age 52   CT coronary calcium test ordered. Counseling on diet low in saturated fat and exercise. Check fasting lipids and risks for heart disease.       Relevant Orders   CT CARDIAC SCORING (SELF PAY ONLY)   Former smoker   Asymptomatic.       Relevant Orders   CT CARDIAC SCORING (SELF PAY ONLY)   GERD (gastroesophageal reflux disease)   Doing well. Avoid triggers.   Follow-up with GI as needed      Prediabetes   Relevant Orders   CBC   Comprehensive metabolic panel   Pure hypercholesterolemia  CT coronary calcium test ordered. Counseling on diet low in saturated fat and getting regular exercise. Check fasting lipids.       Relevant Orders   Lipid panel   Routine general medical examination at a health care facility   Preventive health care reviewed.  She sees her OB/GYN and is scheduled for mammogram and Pap smear.  Up-to-date on colonoscopy. Counseling on healthy lifestyle including  diet and exercise.  Immunizations reviewed.  She may follow-up for Shingrix vaccine at her convenience or get this at her pharmacy.  Encouraged her to check with her insurance regarding this vaccine. Follow-up pending lab results      Screening for heart disease   EKG shows NSR, rate 70. No acute ST-T wave changes from 2017 EKG.  CT coronary calcium score test ordered.  Lipids ordered.  Counseling on healthy diet and exercise.       Relevant Orders   CT CARDIAC SCORING (SELF PAY ONLY)   EKG 12-Lead   Other Visit Diagnoses       Encounter for general adult medical examination with abnormal findings    -  Primary   Relevant Orders   EKG 12-Lead        Return in about 1 year (around 09/11/2024).     Hetty Blend, NP-C

## 2023-09-13 ENCOUNTER — Encounter: Payer: Self-pay | Admitting: Family Medicine

## 2023-09-13 NOTE — Progress Notes (Signed)
 Please see if lab can add on a Hgb A1c due to elevated fasting glucose.

## 2023-09-21 ENCOUNTER — Ambulatory Visit (HOSPITAL_COMMUNITY)
Admission: RE | Admit: 2023-09-21 | Discharge: 2023-09-21 | Disposition: A | Discharge status: Home / Self Care | Payer: Self-pay | Source: Ambulatory Visit | Attending: Family Medicine | Admitting: Family Medicine

## 2023-09-21 DIAGNOSIS — Z8249 Family history of ischemic heart disease and other diseases of the circulatory system: Secondary | ICD-10-CM | POA: Insufficient documentation

## 2023-09-21 DIAGNOSIS — Z87891 Personal history of nicotine dependence: Secondary | ICD-10-CM | POA: Insufficient documentation

## 2023-09-21 DIAGNOSIS — Z136 Encounter for screening for cardiovascular disorders: Secondary | ICD-10-CM | POA: Insufficient documentation

## 2023-09-24 ENCOUNTER — Encounter: Payer: Self-pay | Admitting: Family

## 2023-11-13 DIAGNOSIS — F411 Generalized anxiety disorder: Secondary | ICD-10-CM | POA: Diagnosis not present

## 2023-11-13 DIAGNOSIS — F410 Panic disorder [episodic paroxysmal anxiety] without agoraphobia: Secondary | ICD-10-CM | POA: Diagnosis not present

## 2023-11-29 DIAGNOSIS — Z6830 Body mass index (BMI) 30.0-30.9, adult: Secondary | ICD-10-CM | POA: Diagnosis not present

## 2023-11-29 DIAGNOSIS — Z124 Encounter for screening for malignant neoplasm of cervix: Secondary | ICD-10-CM | POA: Diagnosis not present

## 2023-11-29 DIAGNOSIS — Z01419 Encounter for gynecological examination (general) (routine) without abnormal findings: Secondary | ICD-10-CM | POA: Diagnosis not present

## 2023-11-29 DIAGNOSIS — Z1151 Encounter for screening for human papillomavirus (HPV): Secondary | ICD-10-CM | POA: Diagnosis not present

## 2023-11-29 DIAGNOSIS — Z1231 Encounter for screening mammogram for malignant neoplasm of breast: Secondary | ICD-10-CM | POA: Diagnosis not present

## 2024-01-04 ENCOUNTER — Other Ambulatory Visit: Payer: Self-pay

## 2024-01-04 MED ORDER — SYNTHROID 112 MCG PO TABS: ORAL_TABLET | ORAL | 1 refills | Status: DC

## 2024-01-10 ENCOUNTER — Other Ambulatory Visit: Payer: Self-pay

## 2024-01-24 ENCOUNTER — Encounter: Payer: Self-pay | Admitting: Gastroenterology

## 2024-02-12 DIAGNOSIS — L821 Other seborrheic keratosis: Secondary | ICD-10-CM | POA: Diagnosis not present

## 2024-02-12 DIAGNOSIS — L814 Other melanin hyperpigmentation: Secondary | ICD-10-CM | POA: Diagnosis not present

## 2024-02-12 DIAGNOSIS — L738 Other specified follicular disorders: Secondary | ICD-10-CM | POA: Diagnosis not present

## 2024-02-12 DIAGNOSIS — D225 Melanocytic nevi of trunk: Secondary | ICD-10-CM | POA: Diagnosis not present

## 2024-02-28 ENCOUNTER — Encounter: Payer: Self-pay | Admitting: Gastroenterology

## 2024-02-28 ENCOUNTER — Ambulatory Visit (AMBULATORY_SURGERY_CENTER)

## 2024-02-28 VITALS — Ht 64.0 in | Wt 180.0 lb

## 2024-02-28 DIAGNOSIS — Z860100 Personal history of colon polyps, unspecified: Secondary | ICD-10-CM

## 2024-02-28 DIAGNOSIS — Z8719 Personal history of other diseases of the digestive system: Secondary | ICD-10-CM

## 2024-02-28 MED ORDER — SUTAB 1479-225-188 MG PO TABS: 12.0000 | ORAL_TABLET | ORAL | 0 refills | Status: DC

## 2024-02-28 NOTE — Progress Notes (Signed)
 No egg or soy allergy known to patient  No issues known to pt with past sedation with any surgeries or procedures Patient denies ever being told they had issues or difficulty with intubation  No FH of Malignant Hyperthermia Pt is not on diet pills Pt is not on  home 02  Pt is not on blood thinners  Pt denies issues with constipation  No A fib or A flutter Have any cardiac testing pending--no Pt can ambulate independently Pt denies use of chewing tobacco Discussed diabetic I weight loss medication holds Discussed NSAID holds Checked BMI Pt instructed to use Singlecare.com or GoodRx for a price reduction on prep  Patient's chart reviewed by Alyssa Bartlett CNRA prior to previsit and patient appropriate for the [facility].  Pre visit completed and red dot placed by patient's name on their procedure day (on provider's schedule).

## 2024-03-13 ENCOUNTER — Encounter: Payer: Self-pay | Admitting: Gastroenterology

## 2024-03-13 ENCOUNTER — Ambulatory Visit: Admitting: Gastroenterology

## 2024-03-13 VITALS — BP 131/82 | HR 66 | Temp 98.4°F | Resp 13 | Ht 64.0 in | Wt 180.0 lb

## 2024-03-13 DIAGNOSIS — K648 Other hemorrhoids: Secondary | ICD-10-CM | POA: Diagnosis not present

## 2024-03-13 DIAGNOSIS — L818 Other specified disorders of pigmentation: Secondary | ICD-10-CM | POA: Diagnosis not present

## 2024-03-13 DIAGNOSIS — K635 Polyp of colon: Secondary | ICD-10-CM | POA: Diagnosis not present

## 2024-03-13 DIAGNOSIS — Z860101 Personal history of adenomatous and serrated colon polyps: Secondary | ICD-10-CM

## 2024-03-13 DIAGNOSIS — D123 Benign neoplasm of transverse colon: Secondary | ICD-10-CM | POA: Diagnosis not present

## 2024-03-13 DIAGNOSIS — Z860100 Personal history of colon polyps, unspecified: Secondary | ICD-10-CM

## 2024-03-13 DIAGNOSIS — Z1211 Encounter for screening for malignant neoplasm of colon: Secondary | ICD-10-CM | POA: Diagnosis not present

## 2024-03-13 MED ORDER — SODIUM CHLORIDE 0.9 % IV SOLN: 500.0000 mL | Freq: Once | INTRAVENOUS | Status: DC

## 2024-03-13 NOTE — Progress Notes (Signed)
 Report to PACU, RN, vss, BBS= Clear.

## 2024-03-13 NOTE — Progress Notes (Signed)
 [facility] History and Physical   Primary Care Physician:  Lendia Boby CROME, NP-C   Reason for Procedure:   History of colon polyps  Plan:    colonoscopy     HPI: Alyssa Bartlett is a 53 y.o. female  here for colonoscopy surveillance - history of advanced polyps removed in the past, which were difficult to remove. Last exam in 2022.   Patient denies any bowel symptoms at this time. No family history of colon cancer known. Otherwise feels well without any cardiopulmonary symptoms.   I have discussed risks / benefits of anesthesia and endoscopic procedure with Joen CROME Sor and they wish to proceed with the exams as outlined today.    Past Medical History:  Diagnosis Date   Acute diverticulitis 12/2021   Allergic reaction to food    ? shellfish   Anxiety    GERD (gastroesophageal reflux disease)    Hemorrhoid 09/17/2015   History of chicken pox    Hypothyroidism    managed by [facility]   Mild hyperlipidemia    no meds   Mood disorder    GAD, panic, depression   Prediabetes    Tobacco use     Past Surgical History:  Procedure Laterality Date   COLONOSCOPY     CRANIOTOMY N/A 03/26/2015   Procedure: CRANIOTOMY HYPOPHYSECTOMY TRANSNASAL APPROACH;  Surgeon: Gerldine Maizes, MD;  Location: [facility];  Service: Neurosurgery;  Laterality: N/A;   POLYPECTOMY     SINUS ENDO W/FUSION N/A 03/26/2015   Procedure: ENDOSCOPIC SINUS SURGERY WITH NAVIGATION;  Surgeon: Merilee Kraft, MD;  Location: [facility];  Service: ENT;  Laterality: N/A;    Prior to Admission medications   Medication Sig Start Date End Date Taking? Authorizing Provider  Alum Hydroxide-Mag Carbonate (GAVISCON EXTRA RELIEF FORMULA PO) Take by mouth.   Yes [provider]  escitalopram (LEXAPRO) 20 MG tablet Take 20 mg by mouth at bedtime.   Yes [provider]  LORazepam  (ATIVAN ) 0.5 MG tablet Take 0.5 mg by mouth daily. Take 1 tablet in the morning.   Yes  [provider]  SYNTHROID  112 MCG tablet TAKE 1 TABLET BY MOUTH DAILY BEFORE BREAKFAST. 01/04/24  Yes Trixie File, MD  omeprazole  (PRILOSEC) 40 MG capsule Take 1 capsule (40 mg total) by mouth daily. 11/29/22   Lianne Carreto, Elspeth SQUIBB, MD  Peppermint Oil (IBGARD) 90 MG CPCR Take as directed Patient not taking: Reported on 02/28/2024 05/23/22   Leigh Elspeth SQUIBB, MD    Current Outpatient Medications  Medication Sig Dispense Refill   Alum Hydroxide-Mag Carbonate (GAVISCON EXTRA RELIEF FORMULA PO) Take by mouth.     escitalopram (LEXAPRO) 20 MG tablet Take 20 mg by mouth at bedtime.     LORazepam  (ATIVAN ) 0.5 MG tablet Take 0.5 mg by mouth daily. Take 1 tablet in the morning.     SYNTHROID  112 MCG tablet TAKE 1 TABLET BY MOUTH DAILY BEFORE BREAKFAST. 90 tablet 1   omeprazole  (PRILOSEC) 40 MG capsule Take 1 capsule (40 mg total) by mouth daily. 90 capsule 1   Peppermint Oil (IBGARD) 90 MG CPCR Take as directed (Patient not taking: Reported on 02/28/2024) 12 capsule 0   Current Facility-Administered Medications  Medication Dose Route Frequency Provider Last Rate Last Admin   0.9 %  sodium chloride  infusion  500 mL Intravenous Once Cagney Degrace, Elspeth SQUIBB, MD       0.9 %  sodium chloride  infusion  500 mL Intravenous Once Alegandra Sommers, Elspeth SQUIBB,  MD        Allergies as of 03/13/2024   (No Known Allergies)    Family History  Problem Relation Age of Onset   Allergic rhinitis Mother    Hyperlipidemia Mother    Diabetes Mother    Arthritis Mother    High blood pressure Mother    Allergic rhinitis Father    Prostate cancer Father    Hyperlipidemia Father    Heart disease Father 47       CABG   Heart attack Father 58   Diverticulitis Father        had surgery   Diabetes Father    Colon polyps Father    Arthritis Father    High blood pressure Father    Esophageal cancer Maternal Grandmother    Heart disease Maternal Grandfather    Alzheimer's disease Maternal Grandfather     Colon cancer Neg Hx    Rectal cancer Neg Hx    Stomach cancer Neg Hx     Social History   Socioeconomic History   Marital status: Significant Other    Spouse name: Not on file   Number of children: 0   Years of education: Not on file   Highest education level: Not on file  Occupational History   Occupation: intake speacialists  Tobacco Use   Smoking status: Former    Current packs/day: 0.00    Average packs/day: 1 pack/day for 14.0 years (14.0 ttl pk-yrs)    Types: Cigarettes    Start date: 80    Quit date: 2011    Years since quitting: 14.7   Smokeless tobacco: Never   Tobacco comments:     Nicotine gum quit 04/2017  Vaping Use   Vaping status: Never Used  Substance and Sexual Activity   Alcohol  use: Yes    Alcohol /week: 5.0 - 7.0 standard drinks of alcohol     Types: 5 - 7 Glasses of wine per week    Comment: occ   Drug use: No   Sexual activity: Yes  Other Topics Concern   Not on file  Social History Narrative   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      Home Situation:lives with boyfriend      Spiritual Beliefs: none, but feels spiritually connected to nature and feels better outside      Lifestyle: yoga a few times per week; diet is pretty good      Social Drivers of Corporate investment banker Strain: Not on file  Food Insecurity: Not on file  Transportation Needs: Not on file  Physical Activity: Not on file  Stress: Not on file  Social Connections: Unknown (09/08/2022)   [facility]   Social Network    Social Network: Not on file  Intimate Partner Violence: Unknown (09/08/2022)   Received from [facility]   HITS    Physically Hurt: Not on file    Insult or Talk Down To: Not on file    Threaten Physical Harm: Not on file    Scream or Curse: Not on file    Review of Systems: All other review of systems negative except as mentioned in the HPI.  Physical Exam: Vital signs BP (!) 156/91   Pulse 74   Temp 98.4 F (36.9 C)   Ht  5' 4 (1.626 m)   Wt 180 lb (81.6 kg)   LMP 06/30/2020   SpO2 93%   BMI 30.90 kg/m   General:   Alert,  Well-developed,  pleasant and cooperative in NAD Lungs:  Clear throughout to auscultation.   Heart:  Regular rate and rhythm Abdomen:  Soft, nontender and nondistended.   Neuro/Psych:  Alert and cooperative. Normal mood and affect. A and O x 3  Marcey Naval, MD [facility]

## 2024-03-13 NOTE — Patient Instructions (Addendum)
 See handout provided on polyps.   Resume previous diet.  Continue present medications. Await pathology results.  Repeat colonoscopy in 5 years.   YOU HAD AN ENDOSCOPIC PROCEDURE TODAY AT THE [facility]:   Refer to the procedure report that was given to you for any specific questions about what was found during the examination.  If the procedure report does not answer your questions, please call your gastroenterologist to clarify.  If you requested that your care partner not be given the details of your procedure findings, then the procedure report has been included in a sealed envelope for you to review at your convenience later.  YOU SHOULD EXPECT: Some feelings of bloating in the abdomen. Passage of more gas than usual.  Walking can help get rid of the air that was put into your GI tract during the procedure and reduce the bloating. If you had a lower endoscopy (such as a colonoscopy or flexible sigmoidoscopy) you may notice spotting of blood in your stool or on the toilet paper. If you underwent a bowel prep for your procedure, you may not have a normal bowel movement for a few days.  Please Note:  You might notice some irritation and congestion in your nose or some drainage.  This is from the oxygen used during your procedure.  There is no need for concern and it should clear up in a day or so.  SYMPTOMS TO REPORT IMMEDIATELY:  Following lower endoscopy (colonoscopy or flexible sigmoidoscopy):  Excessive amounts of blood in the stool  Significant tenderness or worsening of abdominal pains  Swelling of the abdomen that is new, acute  Fever of 100F or higher  For urgent or emergent issues, a gastroenterologist can be reached at any hour by calling [phone]. Do not use MyChart messaging for urgent concerns.    DIET:  We do recommend a small meal at first, but then you may proceed to your regular diet.  Drink plenty of fluids but you should avoid alcoholic beverages for  24 hours.  ACTIVITY:  You should plan to take it easy for the rest of today and you should NOT DRIVE or use heavy machinery until tomorrow (because of the sedation medicines used during the test).    FOLLOW UP: Our staff will call the number listed on your records the next business day following your procedure.  We will call around 7:15- 8:00 am to check on you and address any questions or concerns that you may have regarding the information given to you following your procedure. If we do not reach you, we will leave a message.     If any biopsies were taken you will be contacted by phone or by letter within the next 1-3 weeks.  Please call us  at [phone] if you have not heard about the biopsies in 3 weeks.    SIGNATURES/CONFIDENTIALITY: You and/or your care partner have signed paperwork which will be entered into your electronic medical record.  These signatures attest to the fact that that the information above on your After Visit Summary has been reviewed and is understood.  Full responsibility of the confidentiality of this discharge information lies with you and/or your care-partner.

## 2024-03-13 NOTE — Op Note (Signed)
 [facility] Patient Name: Alyssa Bartlett Procedure Date: 03/13/2024 9:53 AM MRN: [id number] Endoscopist: Elspeth P. Leigh , MD, [id number] Age: 53 Referring MD:  Date of Birth: 19-May-1971 Gender: Female Account #: [id number] Procedure:                Colonoscopy Indications:              High risk colon cancer surveillance: Personal                            history of colonic polyps - history of advanced                            polyps in the past, last exam in 2022 Medicines:                Monitored Anesthesia Care Procedure:                Pre-Anesthesia Assessment:                           - Prior to the procedure, a History and Physical                            was performed, and patient medications and                            allergies were reviewed. The patient's tolerance of                            previous anesthesia was also reviewed. The risks                            and benefits of the procedure and the sedation                            options and risks were discussed with the patient.                            All questions were answered, and informed consent                            was obtained. Prior Anticoagulants: The patient has                            taken no anticoagulant or antiplatelet agents. ASA                            Grade Assessment: II - A patient with mild systemic                            disease. After reviewing the risks and benefits,                            the patient was deemed in satisfactory condition to  undergo the procedure.                           After obtaining informed consent, the colonoscope                            was passed under direct vision. Throughout the                            procedure, the patient's blood pressure, pulse, and                            oxygen saturations were monitored continuously. The                            Olympus Scope SN  [id number] was introduced through the                            anus and advanced to the the cecum, identified by                            appendiceal orifice and ileocecal valve. The                            colonoscopy was performed without difficulty. The                            patient tolerated the procedure well. The quality                            of the bowel preparation was good. The ileocecal                            valve, appendiceal orifice, and rectum were                            photographed. Scope In: 10:04:03 AM Scope Out: 10:21:54 AM Scope Withdrawal Time: 0 hours 13 minutes 21 seconds  Total Procedure Duration: 0 hours 17 minutes 51 seconds  Findings:                 The perianal and digital rectal examinations were                            normal.                           A 3 mm polyp was found in the hepatic flexure. The                            polyp was sessile. The polyp was removed with a                            cold snare. Resection and retrieval were complete.  A tattoo was seen in the proximal transverse colon.                           Two sessile polyps were found in the transverse                            colon. The polyps were 3 mm in size. These polyps                            were removed with a cold snare. Resection and                            retrieval were complete.                           A diminutive polyp was found in the recto-sigmoid                            colon. The polyp was sessile. The polyp was removed                            with a cold snare. Resection and retrieval were                            complete.                           Internal hemorrhoids were found during retroflexion.                           The exam was otherwise without abnormality. Complications:            No immediate complications. Estimated blood loss:                            Minimal. Estimated  Blood Loss:     Estimated blood loss was minimal. Impression:               - One 3 mm polyp at the hepatic flexure, removed                            with a cold snare. Resected and retrieved.                           - A tattoo was seen in the proximal transverse                            colon.                           - Two 3 mm polyps in the transverse colon, removed                            with a cold snare. Resected and retrieved.                           -  One diminutive polyp at the recto-sigmoid colon,                            removed with a cold snare. Resected and retrieved.                           - Internal hemorrhoids.                           - The examination was otherwise normal. Recommendation:           - Patient has a contact number available for                            emergencies. The signs and symptoms of potential                            delayed complications were discussed with the                            patient. Return to normal activities tomorrow.                            Written discharge instructions were provided to the                            patient.                           - Resume previous diet.                           - Continue present medications.                           - Await pathology results. Anticipate repeat                            colonoscopy in 5 years Elspeth SQUIBB. Leigh, MD 03/13/2024 10:27:41 AM This report has been signed electronically.

## 2024-03-13 NOTE — Progress Notes (Signed)
 Pt's states no medical or surgical changes since previsit or office visit.

## 2024-03-14 ENCOUNTER — Telehealth: Payer: Self-pay | Admitting: *Deleted

## 2024-03-14 NOTE — Telephone Encounter (Signed)
  Follow up Call-     03/13/2024    9:09 AM  Call back number  Post procedure Call Back phone  # [phone]  Permission to leave phone message Yes     Patient questions:   Message left to call if necessary.

## 2024-03-17 ENCOUNTER — Ambulatory Visit: Payer: Self-pay | Admitting: Gastroenterology

## 2024-03-17 LAB — SURGICAL PATHOLOGY

## 2024-04-08 ENCOUNTER — Other Ambulatory Visit: Payer: Self-pay

## 2024-04-08 MED ORDER — OMEPRAZOLE 40 MG PO CPDR: 40.0000 mg | DELAYED_RELEASE_CAPSULE | Freq: Every day | ORAL | 1 refills | Status: DC

## 2024-04-11 ENCOUNTER — Telehealth: Payer: Self-pay | Admitting: Gastroenterology

## 2024-04-11 MED ORDER — OMEPRAZOLE 40 MG PO CPDR: 40.0000 mg | DELAYED_RELEASE_CAPSULE | Freq: Every day | ORAL | 3 refills | Status: DC

## 2024-04-11 NOTE — Telephone Encounter (Signed)
Omeprazole prescription sent

## 2024-04-11 NOTE — Telephone Encounter (Signed)
 Inbound call from [facility] in regards to this patient Omeprazole . [facility] is stating the never received a renewal prescription for that medication on 04/08/24 and we would need to send in another prescription for this patient. Please advise.

## 2024-04-17 ENCOUNTER — Other Ambulatory Visit: Payer: Self-pay

## 2024-04-17 MED ORDER — OMEPRAZOLE 40 MG PO CPDR: 40.0000 mg | DELAYED_RELEASE_CAPSULE | Freq: Every day | ORAL | 1 refills | Status: AC

## 2024-05-12 DIAGNOSIS — F411 Generalized anxiety disorder: Secondary | ICD-10-CM | POA: Diagnosis not present

## 2024-05-12 DIAGNOSIS — F410 Panic disorder [episodic paroxysmal anxiety] without agoraphobia: Secondary | ICD-10-CM | POA: Diagnosis not present

## 2024-07-02 ENCOUNTER — Telehealth: Payer: Self-pay | Admitting: Internal Medicine

## 2024-07-02 MED ORDER — SYNTHROID 112 MCG PO TABS: ORAL_TABLET | ORAL | 1 refills | Status: AC

## 2024-07-02 NOTE — Telephone Encounter (Signed)
 MEDICATION:  Synthroid  SYNTHROID  112 MCG tablet  PHARMACY:    [id number] - [place], [place] - [address] AT [facility] OF [address] & [facility] [address] (Ph: [phone])    HAS THE PATIENT CONTACTED THEIR PHARMACY?  Yes  LAST REFILL:  @@LASTREFILL @  IS THIS A 90 DAY SUPPLY : Yes  IS PATIENT OUT OF MEDICATION: No  IF NOT; HOW MUCH IS LEFT: 2 pills  LAST APPOINTMENT DATE: @7 /24/2025  NEXT APPOINTMENT DATE:@2 /24/2026  DO WE HAVE YOUR PERMISSION TO LEAVE A DETAILED MESSAGE?:Patient will lose insurance tomorrow 07/03/2024 due to changing jobs & new insurance will not start until March 2026.  OTHER COMMENTS:    **Let patient know to contact pharmacy at the end of the day to make sure medication is ready. **  ** Please notify patient to allow 48-72 hours to process**  **Encourage patient to contact the pharmacy for refills or they can request refills through [facility]**

## 2024-08-12 ENCOUNTER — Ambulatory Visit: Payer: BC Managed Care – PPO | Admitting: Internal Medicine

## 2024-09-16 ENCOUNTER — Encounter: Admitting: Family Medicine

## 2024-09-26 ENCOUNTER — Ambulatory Visit: Admitting: Internal Medicine
